# Patient Record
Sex: Female | Born: 1960
Health system: Southern US, Community
[De-identification: ages and names within clinical notes are randomized; demographics above are authoritative.]

## PROBLEM LIST (undated history)

## (undated) DIAGNOSIS — G2581 Restless legs syndrome: Secondary | ICD-10-CM

## (undated) DIAGNOSIS — K829 Disease of gallbladder, unspecified: Secondary | ICD-10-CM

## (undated) DIAGNOSIS — Z8709 Personal history of other diseases of the respiratory system: Secondary | ICD-10-CM

## (undated) DIAGNOSIS — F329 Major depressive disorder, single episode, unspecified: Secondary | ICD-10-CM

## (undated) DIAGNOSIS — Z9889 Other specified postprocedural states: Secondary | ICD-10-CM

## (undated) DIAGNOSIS — K5900 Constipation, unspecified: Secondary | ICD-10-CM

## (undated) DIAGNOSIS — T7840XA Allergy, unspecified, initial encounter: Secondary | ICD-10-CM

## (undated) DIAGNOSIS — K760 Fatty (change of) liver, not elsewhere classified: Secondary | ICD-10-CM

## (undated) DIAGNOSIS — R112 Nausea with vomiting, unspecified: Secondary | ICD-10-CM

## (undated) DIAGNOSIS — O139 Gestational [pregnancy-induced] hypertension without significant proteinuria, unspecified trimester: Secondary | ICD-10-CM

## (undated) DIAGNOSIS — T4145XA Adverse effect of unspecified anesthetic, initial encounter: Secondary | ICD-10-CM

## (undated) DIAGNOSIS — F32A Depression, unspecified: Secondary | ICD-10-CM

## (undated) DIAGNOSIS — R600 Localized edema: Secondary | ICD-10-CM

## (undated) DIAGNOSIS — M1990 Unspecified osteoarthritis, unspecified site: Secondary | ICD-10-CM

## (undated) DIAGNOSIS — T8859XA Other complications of anesthesia, initial encounter: Secondary | ICD-10-CM

## (undated) DIAGNOSIS — M549 Dorsalgia, unspecified: Secondary | ICD-10-CM

## (undated) DIAGNOSIS — K219 Gastro-esophageal reflux disease without esophagitis: Secondary | ICD-10-CM

## (undated) DIAGNOSIS — G4733 Obstructive sleep apnea (adult) (pediatric): Secondary | ICD-10-CM

## (undated) DIAGNOSIS — E669 Obesity, unspecified: Secondary | ICD-10-CM

## (undated) DIAGNOSIS — J039 Acute tonsillitis, unspecified: Secondary | ICD-10-CM

## (undated) DIAGNOSIS — G4730 Sleep apnea, unspecified: Secondary | ICD-10-CM

## (undated) HISTORY — DX: Acute tonsillitis, unspecified: J03.90

## (undated) HISTORY — PX: HEMORRHOID SURGERY: SHX153

## (undated) HISTORY — DX: Dorsalgia, unspecified: M54.9

## (undated) HISTORY — DX: Fatty (change of) liver, not elsewhere classified: K76.0

## (undated) HISTORY — DX: Allergy, unspecified, initial encounter: T78.40XA

## (undated) HISTORY — DX: Major depressive disorder, single episode, unspecified: F32.9

## (undated) HISTORY — PX: UPPER GASTROINTESTINAL ENDOSCOPY: SHX188

## (undated) HISTORY — DX: Depression, unspecified: F32.A

## (undated) HISTORY — PX: OTHER SURGICAL HISTORY: SHX169

## (undated) HISTORY — DX: Restless legs syndrome: G25.81

## (undated) HISTORY — DX: Personal history of other diseases of the respiratory system: Z87.09

## (undated) HISTORY — DX: Localized edema: R60.0

## (undated) HISTORY — DX: Disease of gallbladder, unspecified: K82.9

## (undated) HISTORY — DX: Obstructive sleep apnea (adult) (pediatric): G47.33

## (undated) HISTORY — PX: CERVICAL BIOPSY: SHX590

## (undated) HISTORY — DX: Constipation, unspecified: K59.00

## (undated) HISTORY — DX: Sleep apnea, unspecified: G47.30

## (undated) HISTORY — PX: TUBAL LIGATION: SHX77

## (undated) HISTORY — DX: Obesity, unspecified: E66.9

## (undated) HISTORY — DX: Gestational (pregnancy-induced) hypertension without significant proteinuria, unspecified trimester: O13.9

---

## 1990-02-04 HISTORY — PX: LAPAROSCOPIC CHOLECYSTECTOMY: SUR755

## 1997-12-16 ENCOUNTER — Ambulatory Visit (HOSPITAL_COMMUNITY): Admission: RE | Admit: 1997-12-16 | Discharge: 1997-12-16 | Payer: Self-pay | Admitting: *Deleted

## 1998-02-17 ENCOUNTER — Other Ambulatory Visit: Admission: RE | Admit: 1998-02-17 | Discharge: 1998-02-17 | Payer: Self-pay | Admitting: Gynecology

## 1998-05-09 ENCOUNTER — Other Ambulatory Visit: Admission: RE | Admit: 1998-05-09 | Discharge: 1998-05-09 | Payer: Self-pay | Admitting: Gynecology

## 1998-10-31 ENCOUNTER — Other Ambulatory Visit: Admission: RE | Admit: 1998-10-31 | Discharge: 1998-10-31 | Payer: Self-pay | Admitting: Gynecology

## 1999-01-25 ENCOUNTER — Other Ambulatory Visit: Admission: RE | Admit: 1999-01-25 | Discharge: 1999-01-25 | Payer: Self-pay | Admitting: Gynecology

## 1999-03-22 ENCOUNTER — Ambulatory Visit (HOSPITAL_COMMUNITY): Admission: RE | Admit: 1999-03-22 | Discharge: 1999-03-22 | Payer: Self-pay | Admitting: Gynecology

## 1999-03-22 ENCOUNTER — Encounter (INDEPENDENT_AMBULATORY_CARE_PROVIDER_SITE_OTHER): Payer: Self-pay | Admitting: Specialist

## 1999-05-24 ENCOUNTER — Other Ambulatory Visit: Admission: RE | Admit: 1999-05-24 | Discharge: 1999-05-24 | Payer: Self-pay | Admitting: Gynecology

## 1999-08-29 ENCOUNTER — Other Ambulatory Visit: Admission: RE | Admit: 1999-08-29 | Discharge: 1999-08-29 | Payer: Self-pay | Admitting: Gynecology

## 2000-03-05 ENCOUNTER — Other Ambulatory Visit: Admission: RE | Admit: 2000-03-05 | Discharge: 2000-03-05 | Payer: Self-pay | Admitting: Gynecology

## 2000-08-26 ENCOUNTER — Other Ambulatory Visit: Admission: RE | Admit: 2000-08-26 | Discharge: 2000-08-26 | Payer: Self-pay | Admitting: Gynecology

## 2001-02-25 ENCOUNTER — Other Ambulatory Visit: Admission: RE | Admit: 2001-02-25 | Discharge: 2001-02-25 | Payer: Self-pay | Admitting: Gynecology

## 2002-10-27 ENCOUNTER — Other Ambulatory Visit: Admission: RE | Admit: 2002-10-27 | Discharge: 2002-10-27 | Payer: Self-pay | Admitting: Gynecology

## 2003-11-09 ENCOUNTER — Other Ambulatory Visit: Admission: RE | Admit: 2003-11-09 | Discharge: 2003-11-09 | Payer: Self-pay | Admitting: Gynecology

## 2004-02-29 ENCOUNTER — Ambulatory Visit: Payer: Self-pay | Admitting: Endocrinology

## 2004-03-02 ENCOUNTER — Ambulatory Visit: Payer: Self-pay | Admitting: Endocrinology

## 2004-11-27 ENCOUNTER — Ambulatory Visit: Payer: Self-pay | Admitting: Internal Medicine

## 2004-11-29 ENCOUNTER — Other Ambulatory Visit: Admission: RE | Admit: 2004-11-29 | Discharge: 2004-11-29 | Payer: Self-pay | Admitting: Gynecology

## 2004-12-04 ENCOUNTER — Ambulatory Visit: Payer: Self-pay | Admitting: Internal Medicine

## 2005-01-03 ENCOUNTER — Other Ambulatory Visit: Admission: RE | Admit: 2005-01-03 | Discharge: 2005-01-03 | Payer: Self-pay | Admitting: Gynecology

## 2005-01-23 ENCOUNTER — Ambulatory Visit (HOSPITAL_BASED_OUTPATIENT_CLINIC_OR_DEPARTMENT_OTHER): Admission: RE | Admit: 2005-01-23 | Discharge: 2005-01-23 | Payer: Self-pay | Admitting: Internal Medicine

## 2005-02-01 ENCOUNTER — Ambulatory Visit: Payer: Self-pay | Admitting: Pulmonary Disease

## 2005-03-05 ENCOUNTER — Ambulatory Visit: Payer: Self-pay | Admitting: Pulmonary Disease

## 2005-04-02 ENCOUNTER — Ambulatory Visit: Payer: Self-pay | Admitting: Pulmonary Disease

## 2005-04-15 ENCOUNTER — Ambulatory Visit: Payer: Self-pay | Admitting: Internal Medicine

## 2005-04-16 ENCOUNTER — Other Ambulatory Visit: Admission: RE | Admit: 2005-04-16 | Discharge: 2005-04-16 | Payer: Self-pay | Admitting: Gynecology

## 2005-05-09 ENCOUNTER — Ambulatory Visit: Payer: Self-pay | Admitting: Internal Medicine

## 2005-08-01 ENCOUNTER — Ambulatory Visit: Payer: Self-pay | Admitting: Pulmonary Disease

## 2005-08-01 ENCOUNTER — Ambulatory Visit: Payer: Self-pay | Admitting: Internal Medicine

## 2005-08-08 ENCOUNTER — Ambulatory Visit: Payer: Self-pay | Admitting: Pulmonary Disease

## 2005-08-08 ENCOUNTER — Ambulatory Visit (HOSPITAL_BASED_OUTPATIENT_CLINIC_OR_DEPARTMENT_OTHER): Admission: RE | Admit: 2005-08-08 | Discharge: 2005-08-08 | Payer: Self-pay | Admitting: Pulmonary Disease

## 2005-08-30 ENCOUNTER — Ambulatory Visit: Payer: Self-pay | Admitting: Pulmonary Disease

## 2005-10-17 ENCOUNTER — Ambulatory Visit: Payer: Self-pay | Admitting: Internal Medicine

## 2005-10-25 ENCOUNTER — Ambulatory Visit: Payer: Self-pay | Admitting: Pulmonary Disease

## 2005-11-21 ENCOUNTER — Ambulatory Visit: Payer: Self-pay | Admitting: Gastroenterology

## 2005-12-03 ENCOUNTER — Ambulatory Visit: Payer: Self-pay | Admitting: Gastroenterology

## 2005-12-16 ENCOUNTER — Ambulatory Visit: Payer: Self-pay | Admitting: Gastroenterology

## 2005-12-16 LAB — CONVERTED CEMR LAB
Fecal Occult Blood: NEGATIVE
OCCULT 5: NEGATIVE

## 2005-12-19 ENCOUNTER — Other Ambulatory Visit: Admission: RE | Admit: 2005-12-19 | Discharge: 2005-12-19 | Payer: Self-pay | Admitting: Gynecology

## 2005-12-31 ENCOUNTER — Ambulatory Visit: Payer: Self-pay | Admitting: Pulmonary Disease

## 2006-01-01 ENCOUNTER — Ambulatory Visit: Payer: Self-pay | Admitting: Internal Medicine

## 2006-01-01 LAB — CONVERTED CEMR LAB
ALT: 25 units/L (ref 0–40)
AST: 53 units/L — ABNORMAL HIGH (ref 0–37)
BUN: 14 mg/dL (ref 6–23)
Basophils Relative: 0.5 % (ref 0.0–1.0)
Bilirubin Urine: NEGATIVE
Chloride: 105 meq/L (ref 96–112)
Chol/HDL Ratio, serum: 4
Cholesterol: 194 mg/dL (ref 0–200)
Eosinophil percent: 1.3 % (ref 0.0–5.0)
GFR calc non Af Amer: 83 mL/min
HCT: 39.1 % (ref 36.0–46.0)
HDL: 48.2 mg/dL (ref >39.0)
Hemoglobin: 13.6 g/dL (ref 12.0–15.0)
Leukocytes, UA: NEGATIVE
MCV: 84.5 fL (ref 78.0–100.0)
RBC: 4.62 M/uL (ref 3.87–5.11)
RDW: 12.8 % (ref 11.5–14.6)
Sodium: 137 meq/L (ref 135–145)
Specific Gravity, Urine: >=1.03 (ref 1.000–1.03)
Total Bilirubin: 0.5 mg/dL (ref 0.3–1.2)
Total Protein, Urine: NEGATIVE mg/dL
Triglyceride fasting, serum: 100 mg/dL (ref 0–149)
WBC: 7.6 10*3/uL (ref 4.5–10.5)

## 2006-01-17 ENCOUNTER — Ambulatory Visit: Payer: Self-pay | Admitting: Internal Medicine

## 2006-04-16 ENCOUNTER — Ambulatory Visit: Payer: Self-pay | Admitting: Pulmonary Disease

## 2006-06-10 ENCOUNTER — Ambulatory Visit: Payer: Self-pay | Admitting: Internal Medicine

## 2006-06-13 ENCOUNTER — Ambulatory Visit: Payer: Self-pay | Admitting: Gastroenterology

## 2006-08-13 ENCOUNTER — Other Ambulatory Visit: Admission: RE | Admit: 2006-08-13 | Discharge: 2006-08-13 | Payer: Self-pay | Admitting: Gynecology

## 2006-09-01 ENCOUNTER — Ambulatory Visit: Payer: Self-pay | Admitting: Internal Medicine

## 2006-10-14 ENCOUNTER — Ambulatory Visit: Payer: Self-pay | Admitting: Internal Medicine

## 2006-10-14 LAB — CONVERTED CEMR LAB
Free T4: 0.7 ng/dL (ref 0.6–1.6)
TSH: 1.55 microintl units/mL (ref 0.35–5.50)

## 2006-11-27 ENCOUNTER — Ambulatory Visit (HOSPITAL_COMMUNITY): Admission: RE | Admit: 2006-11-27 | Discharge: 2006-11-27 | Payer: Self-pay | Admitting: Orthopaedic Surgery

## 2006-12-02 DIAGNOSIS — G4733 Obstructive sleep apnea (adult) (pediatric): Secondary | ICD-10-CM | POA: Insufficient documentation

## 2006-12-02 DIAGNOSIS — G2581 Restless legs syndrome: Secondary | ICD-10-CM | POA: Insufficient documentation

## 2006-12-09 ENCOUNTER — Telehealth: Payer: Self-pay | Admitting: Internal Medicine

## 2006-12-31 ENCOUNTER — Other Ambulatory Visit: Admission: RE | Admit: 2006-12-31 | Discharge: 2006-12-31 | Payer: Self-pay | Admitting: Gynecology

## 2007-01-15 ENCOUNTER — Ambulatory Visit: Payer: Self-pay | Admitting: Internal Medicine

## 2007-01-15 LAB — CONVERTED CEMR LAB
AST: 54 units/L — ABNORMAL HIGH (ref 0–37)
Bilirubin, Direct: 0.1 mg/dL (ref 0.0–0.3)
Chloride: 104 meq/L (ref 96–112)
Cholesterol: 188 mg/dL (ref 0–200)
Eosinophils Absolute: 0.1 10*3/uL (ref 0.0–0.6)
Eosinophils Relative: 1.6 % (ref 0.0–5.0)
GFR calc non Af Amer: 82 mL/min
Glucose, Bld: 111 mg/dL — ABNORMAL HIGH (ref 70–99)
HCT: 37.3 % (ref 36.0–46.0)
Leukocytes, UA: NEGATIVE
Lymphocytes Relative: 30.8 % (ref 12.0–46.0)
MCV: 81.8 fL (ref 78.0–100.0)
Neutro Abs: 4.6 10*3/uL (ref 1.4–7.7)
Neutrophils Relative %: 60 % (ref 43.0–77.0)
Nitrite: NEGATIVE
RBC: 4.56 M/uL (ref 3.87–5.11)
Sodium: 138 meq/L (ref 135–145)
TSH: 1.55 microintl units/mL (ref 0.35–5.50)
Total Bilirubin: 0.7 mg/dL (ref 0.3–1.2)
Total Protein: 7.3 g/dL (ref 6.0–8.3)
Urobilinogen, UA: 0.2 (ref 0.0–1.0)
WBC: 7.6 10*3/uL (ref 4.5–10.5)

## 2007-01-16 ENCOUNTER — Encounter: Payer: Self-pay | Admitting: Internal Medicine

## 2007-01-20 ENCOUNTER — Ambulatory Visit: Payer: Self-pay | Admitting: Internal Medicine

## 2007-01-20 DIAGNOSIS — J3089 Other allergic rhinitis: Secondary | ICD-10-CM

## 2007-01-20 DIAGNOSIS — J302 Other seasonal allergic rhinitis: Secondary | ICD-10-CM | POA: Insufficient documentation

## 2007-01-20 DIAGNOSIS — M25569 Pain in unspecified knee: Secondary | ICD-10-CM | POA: Insufficient documentation

## 2007-01-20 DIAGNOSIS — K5730 Diverticulosis of large intestine without perforation or abscess without bleeding: Secondary | ICD-10-CM | POA: Insufficient documentation

## 2007-01-22 ENCOUNTER — Encounter: Payer: Self-pay | Admitting: Internal Medicine

## 2007-08-04 ENCOUNTER — Ambulatory Visit: Payer: Self-pay | Admitting: Internal Medicine

## 2007-08-04 DIAGNOSIS — M549 Dorsalgia, unspecified: Secondary | ICD-10-CM | POA: Insufficient documentation

## 2007-09-16 ENCOUNTER — Telehealth: Payer: Self-pay | Admitting: Internal Medicine

## 2007-09-17 ENCOUNTER — Encounter: Payer: Self-pay | Admitting: Internal Medicine

## 2007-09-17 DIAGNOSIS — H9209 Otalgia, unspecified ear: Secondary | ICD-10-CM | POA: Insufficient documentation

## 2007-09-30 ENCOUNTER — Encounter: Payer: Self-pay | Admitting: Internal Medicine

## 2008-01-07 ENCOUNTER — Encounter: Payer: Self-pay | Admitting: Internal Medicine

## 2008-01-07 LAB — CONVERTED CEMR LAB

## 2008-01-08 ENCOUNTER — Ambulatory Visit: Payer: Self-pay | Admitting: Internal Medicine

## 2008-01-08 LAB — CONVERTED CEMR LAB
AST: 49 units/L — ABNORMAL HIGH (ref 0–37)
Basophils Absolute: 0 10*3/uL (ref 0.0–0.1)
Basophils Relative: 0.3 % (ref 0.0–3.0)
Bilirubin Urine: NEGATIVE
Bilirubin, Direct: 0.1 mg/dL (ref 0.0–0.3)
CO2: 24 meq/L (ref 19–32)
Calcium: 9.2 mg/dL (ref 8.4–10.5)
Chloride: 108 meq/L (ref 96–112)
Cholesterol: 185 mg/dL (ref 0–200)
Creatinine, Ser: 0.8 mg/dL (ref 0.4–1.2)
Eosinophils Absolute: 0.1 10*3/uL (ref 0.0–0.7)
GFR calc non Af Amer: 82 mL/min
HDL: 48.3 mg/dL (ref >39.0)
Hemoglobin, Urine: NEGATIVE
Ketones, ur: NEGATIVE mg/dL
LDL Cholesterol: 109 mg/dL — ABNORMAL HIGH (ref 0–99)
Leukocytes, UA: NEGATIVE
Lymphocytes Relative: 31.6 % (ref 12.0–46.0)
MCHC: 34.4 g/dL (ref 30.0–36.0)
MCV: 81.3 fL (ref 78.0–100.0)
Neutrophils Relative %: 59.7 % (ref 43.0–77.0)
RBC: 4.53 M/uL (ref 3.87–5.11)
RDW: 13.8 % (ref 11.5–14.6)
Sodium: 139 meq/L (ref 135–145)
TSH: 1.44 microintl units/mL (ref 0.35–5.50)
Total Bilirubin: 0.6 mg/dL (ref 0.3–1.2)
Total CHOL/HDL Ratio: 3.8
Triglycerides: 139 mg/dL (ref 0–149)
Urine Glucose: NEGATIVE mg/dL
Urobilinogen, UA: 0.2 (ref 0.0–1.0)
VLDL: 28 mg/dL (ref 0–40)

## 2008-01-14 ENCOUNTER — Ambulatory Visit: Payer: Self-pay | Admitting: Internal Medicine

## 2008-05-19 ENCOUNTER — Encounter: Payer: Self-pay | Admitting: Internal Medicine

## 2008-10-21 ENCOUNTER — Telehealth: Payer: Self-pay | Admitting: Internal Medicine

## 2008-11-15 ENCOUNTER — Encounter: Payer: Self-pay | Admitting: Internal Medicine

## 2008-11-16 ENCOUNTER — Encounter: Payer: Self-pay | Admitting: Internal Medicine

## 2009-01-02 ENCOUNTER — Telehealth (INDEPENDENT_AMBULATORY_CARE_PROVIDER_SITE_OTHER): Payer: Self-pay | Admitting: *Deleted

## 2009-01-10 ENCOUNTER — Ambulatory Visit: Payer: Self-pay | Admitting: Internal Medicine

## 2009-01-10 LAB — CONVERTED CEMR LAB
Albumin: 4.1 g/dL (ref 3.5–5.2)
Alkaline Phosphatase: 95 units/L (ref 39–117)
BUN: 12 mg/dL (ref 6–23)
Basophils Absolute: 0 10*3/uL (ref 0.0–0.1)
Bilirubin, Direct: 0 mg/dL (ref 0.0–0.3)
CO2: 26 meq/L (ref 19–32)
Calcium: 9.3 mg/dL (ref 8.4–10.5)
Creatinine, Ser: 0.9 mg/dL (ref 0.4–1.2)
Eosinophils Absolute: 0.1 10*3/uL (ref 0.0–0.7)
GFR calc non Af Amer: 71.1 mL/min (ref >60)
Glucose, Bld: 106 mg/dL — ABNORMAL HIGH (ref 70–99)
HDL: 59.3 mg/dL (ref >39.00)
Hgb A1c MFr Bld: 5.9 % (ref 4.6–6.5)
Iron: 55 ug/dL (ref 42–145)
Lymphocytes Relative: 28.8 % (ref 12.0–46.0)
MCHC: 33.3 g/dL (ref 30.0–36.0)
Monocytes Relative: 6.6 % (ref 3.0–12.0)
Neutro Abs: 4.4 10*3/uL (ref 1.4–7.7)
Neutrophils Relative %: 63.1 % (ref 43.0–77.0)
Platelets: 265 10*3/uL (ref 150.0–400.0)
RDW: 15.5 % — ABNORMAL HIGH (ref 11.5–14.6)
Saturation Ratios: 12.2 % — ABNORMAL LOW (ref 20.0–50.0)
Specific Gravity, Urine: >=1.03 (ref 1.000–1.030)
Total Bilirubin: 0.6 mg/dL (ref 0.3–1.2)
Transferrin: 321.5 mg/dL (ref 212.0–360.0)
Triglycerides: 75 mg/dL (ref 0.0–149.0)
Urine Glucose: NEGATIVE mg/dL
Urobilinogen, UA: 0.2 (ref 0.0–1.0)
VLDL: 15 mg/dL (ref 0.0–40.0)
pH: 5 (ref 5.0–8.0)

## 2009-01-11 ENCOUNTER — Encounter: Payer: Self-pay | Admitting: Internal Medicine

## 2009-01-17 ENCOUNTER — Ambulatory Visit: Payer: Self-pay | Admitting: Internal Medicine

## 2009-04-05 ENCOUNTER — Ambulatory Visit: Payer: Self-pay | Admitting: Internal Medicine

## 2009-04-05 DIAGNOSIS — J029 Acute pharyngitis, unspecified: Secondary | ICD-10-CM | POA: Insufficient documentation

## 2009-04-05 LAB — CONVERTED CEMR LAB
Basophils Absolute: 0.1 10*3/uL (ref 0.0–0.1)
Eosinophils Absolute: 0 10*3/uL (ref 0.0–0.7)
HCT: 33.8 % — ABNORMAL LOW (ref 36.0–46.0)
Hemoglobin: 11.3 g/dL — ABNORMAL LOW (ref 12.0–15.0)
Lymphocytes Relative: 5.3 % — ABNORMAL LOW (ref 12.0–46.0)
Lymphs Abs: 0.9 10*3/uL (ref 0.7–4.0)
MCHC: 33.6 g/dL (ref 30.0–36.0)
Neutro Abs: 15 10*3/uL — ABNORMAL HIGH (ref 1.4–7.7)
RDW: 14.4 % (ref 11.5–14.6)

## 2009-04-07 ENCOUNTER — Telehealth: Payer: Self-pay | Admitting: Internal Medicine

## 2009-04-07 ENCOUNTER — Ambulatory Visit: Payer: Self-pay | Admitting: Internal Medicine

## 2009-04-07 DIAGNOSIS — J36 Peritonsillar abscess: Secondary | ICD-10-CM | POA: Insufficient documentation

## 2009-04-14 ENCOUNTER — Telehealth: Payer: Self-pay | Admitting: Internal Medicine

## 2009-04-18 ENCOUNTER — Encounter: Payer: Self-pay | Admitting: Internal Medicine

## 2009-04-26 ENCOUNTER — Ambulatory Visit: Payer: Self-pay | Admitting: Internal Medicine

## 2009-05-02 ENCOUNTER — Encounter: Payer: Self-pay | Admitting: Internal Medicine

## 2009-05-02 ENCOUNTER — Telehealth: Payer: Self-pay | Admitting: Internal Medicine

## 2009-05-04 ENCOUNTER — Ambulatory Visit: Payer: Self-pay | Admitting: Internal Medicine

## 2009-06-21 ENCOUNTER — Telehealth: Payer: Self-pay | Admitting: Gastroenterology

## 2009-07-08 ENCOUNTER — Telehealth: Payer: Self-pay | Admitting: Internal Medicine

## 2009-09-29 ENCOUNTER — Encounter: Payer: Self-pay | Admitting: Internal Medicine

## 2009-10-04 ENCOUNTER — Encounter: Payer: Self-pay | Admitting: Internal Medicine

## 2009-10-05 ENCOUNTER — Telehealth: Payer: Self-pay | Admitting: Internal Medicine

## 2009-11-03 ENCOUNTER — Ambulatory Visit: Payer: Self-pay | Admitting: Internal Medicine

## 2009-11-29 ENCOUNTER — Telehealth: Payer: Self-pay | Admitting: Internal Medicine

## 2009-12-05 ENCOUNTER — Ambulatory Visit: Payer: Self-pay | Admitting: Cardiovascular Disease

## 2009-12-13 ENCOUNTER — Telehealth: Payer: Self-pay | Admitting: Internal Medicine

## 2009-12-14 ENCOUNTER — Encounter: Payer: Self-pay | Admitting: Internal Medicine

## 2009-12-15 ENCOUNTER — Telehealth: Payer: Self-pay | Admitting: Internal Medicine

## 2010-01-09 ENCOUNTER — Ambulatory Visit: Payer: Self-pay | Admitting: Internal Medicine

## 2010-01-09 LAB — CONVERTED CEMR LAB
ALT: 23 units/L (ref 0–35)
Alkaline Phosphatase: 73 units/L (ref 39–117)
BUN: 14 mg/dL (ref 6–23)
Basophils Relative: 0.4 % (ref 0.0–3.0)
Bilirubin Urine: NEGATIVE
Bilirubin, Direct: 0 mg/dL (ref 0.0–0.3)
Calcium: 8.9 mg/dL (ref 8.4–10.5)
Chloride: 103 meq/L (ref 96–112)
Creatinine, Ser: 0.7 mg/dL (ref 0.4–1.2)
Direct LDL: 105.1 mg/dL
Eosinophils Relative: 1.8 % (ref 0.0–5.0)
GFR calc non Af Amer: 90.15 mL/min (ref >60.00)
Lymphocytes Relative: 25.8 % (ref 12.0–46.0)
MCV: 77.3 fL — ABNORMAL LOW (ref 78.0–100.0)
Monocytes Absolute: 0.6 10*3/uL (ref 0.1–1.0)
Neutrophils Relative %: 63.9 % (ref 43.0–77.0)
Nitrite: NEGATIVE
Platelets: 241 10*3/uL (ref 150.0–400.0)
RBC: 4.19 M/uL (ref 3.87–5.11)
Total Bilirubin: 0.5 mg/dL (ref 0.3–1.2)
Total Protein, Urine: NEGATIVE mg/dL
Total Protein: 7 g/dL (ref 6.0–8.3)
Urine Glucose: NEGATIVE mg/dL
WBC: 6.8 10*3/uL (ref 4.5–10.5)
pH: 5 (ref 5.0–8.0)

## 2010-01-15 ENCOUNTER — Encounter: Payer: Self-pay | Admitting: Internal Medicine

## 2010-01-18 ENCOUNTER — Encounter: Payer: Self-pay | Admitting: Internal Medicine

## 2010-01-18 ENCOUNTER — Ambulatory Visit: Payer: Self-pay | Admitting: Internal Medicine

## 2010-01-18 DIAGNOSIS — D509 Iron deficiency anemia, unspecified: Secondary | ICD-10-CM | POA: Insufficient documentation

## 2010-01-18 LAB — CONVERTED CEMR LAB
Iron: 29 ug/dL — ABNORMAL LOW (ref 42–145)
Retic Ct Pct: 1 % (ref 0.4–3.1)
Saturation Ratios: 5.6 % — ABNORMAL LOW (ref 20.0–50.0)
Transferrin: 370.9 mg/dL — ABNORMAL HIGH (ref 212.0–360.0)

## 2010-01-22 ENCOUNTER — Telehealth: Payer: Self-pay | Admitting: Internal Medicine

## 2010-03-06 NOTE — Consult Note (Signed)
Summary: [facility]  [facility]   Imported By: Lester [place] 10/10/2009 10:12:25  _____________________________________________________________________  External Attachment:    Type:   Image     Comment:   External Document

## 2010-03-06 NOTE — Assessment & Plan Note (Signed)
Summary: THROAT RE-CHECK/NWS   Vital Signs:  Patient profile:   50 year old female Height:      66 inches Weight:      250 pounds BMI:     40.50 O2 Sat:      97 % on Room air Temp:     98.1 degrees F oral Pulse rate:   90 / minute BP sitting:   120 / 70  (left arm) Cuff size:   large  Vitals Entered By: Bill Salinas CMA (November 03, 2009 1:07 PM)  O2 Flow:  Room air CC: Pt here for evaluation of sore throat since march/ ab   Primary Care [place] [facility]:  [facility]  CC:  Pt here for evaluation of sore throat since march/ ab.  History of Present Illness: [place] has had an intermittent sore throat since March. She has been treated with multiple rounds of antibiotics but the discomfort will continue to recur. Her pain is predominantly on the left side and seems to be deep in the throat. She has had no fevers, dysphagia, odynophagia. She has been screened for possible chronic disease including STDs. She has seen Dr. Narda Bonds and Dr Kelli Churn with normal exams but no fiberoptic nasopharyngoscopic exam. She has not had weight loss, night sweats or lymphadenopathy. She is concerned for a more troublesome or serious diagnosis.  Current Medications (verified): 1)  Pantoprazole Sodium 40 Mg Tbec (Pantoprazole Sodium) .Marland Kitchen.. 1 By Mouth Q Am 2)  Citracal Plus   Tabs (Multiple Minerals-Vitamins) .... Take 2 By Mouth Once Daily 3)  Fibercon 625 Mg  Tabs (Calcium Polycarbophil) .... Take 2 By Mouth Once Daily 4)  Ropinirole Hcl 0.25 Mg  Tabs (Ropinirole Hcl) .... 3 Tabs At Bedtime 5)  Zyrtec-D Allergy & Congestion 5-120 Mg Xr12h-Tab (Cetirizine-Pseudoephedrine) .... Take 1 Tablet By Mouth Every 12 Hours 6)  Vitamin D3 1000 Unit  Tabs (Cholecalciferol) .... 2 By Mouth Once Daily 7)  Robitussin Cough Long-Acting 15 Mg/77ml  Syrp (Dextromethorphan Hbr) .... Take As Needed As Directed 8)  Mucinex Dm 30-600 Mg  Tb12 (Dextromethorphan-Guaifenesin) .... Take As Needed As Directed 9)  Glucosamine-Chondroitin  1500-1200 Mg/18ml  Liqd (Glucosamine-Chondroitin) .... Double Strength Take 1 Tablet By Mouth Three Times A Day 10)  Flax Seed Oil 1000 Mg  Caps (Flaxseed (Linseed)) .... Take 3 Tablet By Mouth Two Times A Day 11)  Aleve 220 Mg Tabs (Naproxen Sodium) .... Take 1 Tab By Mouth At Bedtime As Needed 12)  Advil 200 Mg Tabs (Ibuprofen) .... Take 1 Tab By Mouth At Bedtime As Needed 13)  Triple Flex 2 14)  Allegra-D Allergy & Congestion 60-120 Mg Xr12h-Tab (Fexofenadine-Pseudoephedrine) .Marland Kitchen.. 1 Tab Daily  Allergies (verified): 1)  ! Cleocin  Past History:  Past Medical History: Last updated: 01/20/2007  UCD ROUTINE GENERAL MEDICAL EXAM@HEALTH  CARE FACL (ICD-V70.0) RESTLESS LEG SYNDROME (ICD-333.94) OBSTRUCTIVE SLEEP APNEA (ICD-327.23) Tonsillitis esophagitis Allergic rhinitis hx of sinusitis Obesity  Past Surgical History: Last updated: 01/20/2007 cervical biopsy  Lap cholecystectomy '92 Bilateral salpingectomy C-section x 23  Family History: Last updated: 01/20/2007 father- 1939; HTN, glaucoma, obesity mother - 42; Asthma, COPD Neg- breast, colon cancer; DM, CAD  Social History: Last updated: 01/20/2007 HSG married 1981-divorce '89 2 daughters, '86, 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  Review of Systems       The patient complains of weight loss.  The patient denies anorexia, fever, weight gain, decreased hearing, chest pain, syncope, dyspnea on exertion, prolonged cough, hemoptysis,  abdominal pain, severe indigestion/heartburn, muscle weakness, suspicious skin lesions, unusual weight change, abnormal bleeding, enlarged lymph nodes, and angioedema.    Physical Exam  General:  overweight (but less) white female in no distress Head:  normocephalic, atraumatic, and no abnormalities observed.   Mouth:  posterior pharynx clear Neck:  supple, no masses, no thyromegaly, and no thyroid nodules or tenderness.   Lungs:  normal respiratory  effort.   Heart:  normal rate and regular rhythm.   Pulses:  2+ radial Skin:  turgor normal and color normal.   Cervical Nodes:  no anterior cervical adenopathy and no posterior cervical adenopathy.   Psych:  normally interactive, good eye contact, and moderately anxious.     Impression & Recommendations:  Problem # 1:  EXUDATIVE PHARYNGITIS (ICD-462) persistent symptoms but no signs of infection today. Negative ENT exams as well.  Plan - for possible persistent fungal infection - fluconazole 100mg  once daily x 14 days           if persistent or recurrent symptoms - MRI neck to rule out any underlying deep seated infection or malignancy.  The following medications were removed from the medication list:    Penicillin V Potassium 500 Mg Tabs (Penicillin v potassium) .Marland Kitchen... 1 by mouth qid x 7 for pharyngitis Her updated medication list for this problem includes:    Aleve 220 Mg Tabs (Naproxen sodium) .Marland Kitchen... Take 1 tab by mouth at bedtime as needed    Advil 200 Mg Tabs (Ibuprofen) .Marland Kitchen... Take 1 tab by mouth at bedtime as needed  Complete Medication List: 1)  Pantoprazole Sodium 40 Mg Tbec (Pantoprazole sodium) .Marland Kitchen.. 1 by mouth q am 2)  Citracal Plus Tabs (Multiple minerals-vitamins) .... Take 2 by mouth once daily 3)  Fibercon 625 Mg Tabs (Calcium polycarbophil) .... Take 2 by mouth once daily 4)  Ropinirole Hcl 0.25 Mg Tabs (Ropinirole hcl) .... 3 tabs at bedtime 5)  Zyrtec-d Allergy & Congestion 5-120 Mg Xr12h-tab (Cetirizine-pseudoephedrine) .... Take 1 tablet by mouth every 12 hours 6)  Vitamin D3 1000 Unit Tabs (Cholecalciferol) .... 2 by mouth once daily 7)  Robitussin Cough Long-acting 15 Mg/22ml Syrp (Dextromethorphan hbr) .... Take as needed as directed 8)  Mucinex Dm 30-600 Mg Tb12 (Dextromethorphan-guaifenesin) .... Take as needed as directed 9)  Glucosamine-chondroitin 1500-1200 Mg/55ml Liqd (Glucosamine-chondroitin) .... Double strength take 1 tablet by mouth three times a day 10)   Flax Seed Oil 1000 Mg Caps (Flaxseed (linseed)) .... Take 3 tablet by mouth two times a day 11)  Aleve 220 Mg Tabs (Naproxen sodium) .... Take 1 tab by mouth at bedtime as needed 12)  Advil 200 Mg Tabs (Ibuprofen) .... Take 1 tab by mouth at bedtime as needed 13)  Triple Flex 2  14)  Allegra-d Allergy & Congestion 60-120 Mg Xr12h-tab (Fexofenadine-pseudoephedrine) .Marland Kitchen.. 1 tab daily 15)  Fluconazole 100 Mg Tabs (Fluconazole) .Marland Kitchen.. 1 by mouth once daily x 14 days for pharyngitis. 16)  Sertraline Hcl 50 Mg Tabs (Sertraline hcl) .Marland Kitchen.. 1 by mouth once daily for anxiety and depression Prescriptions: SERTRALINE HCL 50 MG TABS (SERTRALINE HCL) 1 by mouth once daily for anxiety and depression  #30 x 6   Entered and Authorized by:   Jacques Navy MD   Signed by:   Jacques Navy MD on 11/03/2009   Method used:   Electronically to        [facility]. #[id number]* (retail)       [address]  [place], [place]  [address]       Ph: [phone]       Fax: [phone]   RxID:   [id number] FLUCONAZOLE 100 MG TABS (FLUCONAZOLE) 1 by mouth once daily x 14 days for pharyngitis.  #14 x 0   Entered and Authorized by:   Jacques Navy MD   Signed by:   Jacques Navy MD on 11/03/2009   Method used:   Electronically to        [facility]. [phone]* (retail)       [address]       [place], [place]  [address]       Ph: [phone]       Fax: [phone]   RxID:   [id number]   Appended Document: THROAT RE-CHECK/NWS This situation of persistent throat pain has caused significant anxiety and some exacerbation of depression.   Plan - sertraline 50mg  once daily

## 2010-03-06 NOTE — Progress Notes (Signed)
Summary: THROAT CULTURE  Phone Note Call from Patient   Summary of Call: Patient is requesting to know if MD would be agreeable to throat culture. She says she has never had this done and would like to before signing up for surgery. If ok, can she be swabbed and sent for culture as nurse visit?  Initial call taken by: Lamar Sprinkles, CMA,  December 15, 2009 9:01 AM  Follow-up for Phone Call        throat culture will not be meaningful in the absence of active infection. The CT did not show active infection, rather scarring from previous infections. We have done throat cultures in the acute setting previously  Follow-up by: Jacques Navy MD,  December 15, 2009 12:53 PM  Additional Follow-up for Phone Call Additional follow up Details #1::        Left mess for pt to call back Additional Follow-up by: Lamar Sprinkles, CMA,  December 15, 2009 1:31 PM    Additional Follow-up for Phone Call Additional follow up Details #2::    Pt informed  Follow-up by: Lamar Sprinkles, CMA,  December 15, 2009 2:37 PM

## 2010-03-06 NOTE — Progress Notes (Signed)
Summary: Align Samples  Phone Note Call from Patient   Summary of Call: pt requested samples of Align.  Pt states Dr. Nicholas Lose prescribed for one month and did not have enough samples to cover a full month.  Samples given to pt.  Initial call taken by: Francee Piccolo CMA Duncan Dull),  Jun 21, 2009 2:22 PM    New/Updated Medications: ALIGN  CAPS (PROBIOTIC PRODUCT) Take 1 capsule by mouth once a day

## 2010-03-06 NOTE — Assessment & Plan Note (Signed)
Summary: trouble swallowing / SD   Vital Signs:  Patient profile:   50 year old female Height:      66 inches (167.64 cm) Weight:      211.38 pounds (96.08 kg) BMI:     34.24 O2 Sat:      97 % on Room air Temp:     98.3 degrees F oral Pulse rate:   84 / minute Pulse rhythm:   regular Resp:     16 per minute BP sitting:   122 / 74  (left arm) Cuff size:   large  Vitals Entered By: Brenton Grills (April 07, 2009 1:39 PM)  O2 Flow:  Room air CC: Pt is still haviing trouble swollowing oth solids and liquids. Pt also states she has numbness on left side of neck/aj   Primary Care Provider:  [facility]  CC:  Pt is still haviing trouble swollowing oth solids and liquids. Pt also states she has numbness on left side of neck/aj.  History of Present Illness: She returns with worsening of  her ST and it has now  become assymetrical on the left side with an earache and LAD. She has developed "hot potato" voice and can't swallow any solids or liquids.  Current Medications (verified): 1)  Pantoprazole Sodium 40 Mg Tbec (Pantoprazole Sodium) .Marland Kitchen.. 1 By Mouth Q Am 2)  Citracal Plus   Tabs (Multiple Minerals-Vitamins) .... Take 2 By Mouth Once Daily 3)  Fibercon 625 Mg  Tabs (Calcium Polycarbophil) .... Take 2 By Mouth Once Daily 4)  Ropinirole Hcl 0.25 Mg  Tabs (Ropinirole Hcl) .... 3 Tabs At Bedtime 5)  Zyrtec-D Allergy & Congestion 5-120 Mg Xr12h-Tab (Cetirizine-Pseudoephedrine) .... Take 1 Tablet By Mouth Every 12 Hours 6)  Vitamin D3 1000 Unit  Tabs (Cholecalciferol) .... 2 By Mouth Once Daily 7)  Robitussin Cough Long-Acting 15 Mg/3ml  Syrp (Dextromethorphan Hbr) .... Take As Needed As Directed 8)  Mucinex Dm 30-600 Mg  Tb12 (Dextromethorphan-Guaifenesin) .... Take As Needed As Directed 9)  Glucosamine-Chondroitin 1500-1200 Mg/44ml  Liqd (Glucosamine-Chondroitin) .... Double Strength Take 1 Tablet By Mouth Three Times A Day 10)  Flax Seed Oil 1000 Mg  Caps (Flaxseed (Linseed)) .... Take 3  Tablet By Mouth Two Times A Day 11)  Aleve 220 Mg Tabs (Naproxen Sodium) .... Take 1 Tab By Mouth At Bedtime As Needed 12)  Advil 200 Mg Tabs (Ibuprofen) .... Take 1 Tab By Mouth At Bedtime As Needed 13)  Triple Flex 2 14)  Zithromax Tri-Pak 500 Mg Tab (Azithromycin) .... Take As Directed One By Mouth Once Daily For 3 Days  Allergies (verified): No Known Drug Allergies  Past History:  Past Medical History: Reviewed history from 01/20/2007 and no changes required.  UCD ROUTINE GENERAL MEDICAL EXAM@HEALTH  CARE FACL (ICD-V70.0) RESTLESS LEG SYNDROME (ICD-333.94) OBSTRUCTIVE SLEEP APNEA (ICD-327.23) Tonsillitis esophagitis Allergic rhinitis hx of sinusitis Obesity  Past Surgical History: Reviewed history from 01/20/2007 and no changes required. cervical biopsy  Lap cholecystectomy '92 Bilateral salpingectomy C-section x 23  Family History: Reviewed history from 01/20/2007 and no changes required. father- 67; HTN, glaucoma, obesity mother - 72; Asthma, COPD Neg- breast, colon cancer; DM, CAD  Social History: Reviewed history from 01/20/2007 and no changes required. 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  Review of Systems       The patient complains of enlarged lymph nodes.  The patient denies anorexia, fever, weight loss, chest  pain, abdominal pain, and suspicious skin lesions.    Physical Exam  General:  She has a muffled voice that is classic for the hot potato sound and has mild trismus but no stridor. Head:  normocephalic, atraumatic, no abnormalities observed, and no abnormalities palpated.   Eyes:  vision grossly intact and pupils equal.   Ears:  R ear normal and L ear normal.   Mouth:  pharyngeal crowding  with encroachment from the left side and low-lying uvula with a bulge c/w peritonsillar abscess.  the airway is patent as the right side is not swollen and has no  exudate. Neck:  supple, full ROM, and no masses.   Lungs:  normal respiratory effort, no intercostal retractions, no accessory muscle use, normal breath sounds, no dullness, no fremitus, no crackles, and no wheezes.   Heart:  normal rate, regular rhythm, no murmur, no gallop, and no rub.   Abdomen:  soft, non-tender, normal bowel sounds, no distention, no masses, no guarding, no hepatomegaly, and no splenomegaly.   Msk:  normal ROM, no joint tenderness, no joint swelling, no joint warmth, and no redness over joints.   Extremities:  No clubbing, cyanosis, edema, or deformity noted with normal full range of motion of all joints.   Skin:  turgor normal, color normal, no rashes, no suspicious lesions, and no ecchymoses.   Cervical Nodes:  R anterior LN tender and L anterior LN tender.   Axillary Nodes:  no R axillary adenopathy and no L axillary adenopathy.   Psych:  Cognition and judgment appear intact. Alert and cooperative with normal attention span and concentration. No apparent delusions, illusions, hallucinations   Impression & Recommendations:  Problem # 1:  ABSCESS, PERITONSILLAR, LEFT (ICD-475) Assessment Deteriorated I called Dr. Haroldine Laws and he will see her now. Orders: ENT Referral (ENT)  Complete Medication List: 1)  Pantoprazole Sodium 40 Mg Tbec (Pantoprazole sodium) .Marland Kitchen.. 1 by mouth q am 2)  Citracal Plus Tabs (Multiple minerals-vitamins) .... Take 2 by mouth once daily 3)  Fibercon 625 Mg Tabs (Calcium polycarbophil) .... Take 2 by mouth once daily 4)  Ropinirole Hcl 0.25 Mg Tabs (Ropinirole hcl) .... 3 tabs at bedtime 5)  Zyrtec-d Allergy & Congestion 5-120 Mg Xr12h-tab (Cetirizine-pseudoephedrine) .... Take 1 tablet by mouth every 12 hours 6)  Vitamin D3 1000 Unit Tabs (Cholecalciferol) .... 2 by mouth once daily 7)  Robitussin Cough Long-acting 15 Mg/56ml Syrp (Dextromethorphan hbr) .... Take as needed as directed 8)  Mucinex Dm 30-600 Mg Tb12 (Dextromethorphan-guaifenesin)  .... Take as needed as directed 9)  Glucosamine-chondroitin 1500-1200 Mg/68ml Liqd (Glucosamine-chondroitin) .... Double strength take 1 tablet by mouth three times a day 10)  Flax Seed Oil 1000 Mg Caps (Flaxseed (linseed)) .... Take 3 tablet by mouth two times a day 11)  Aleve 220 Mg Tabs (Naproxen sodium) .... Take 1 tab by mouth at bedtime as needed 12)  Advil 200 Mg Tabs (Ibuprofen) .... Take 1 tab by mouth at bedtime as needed 13)  Triple Flex 2  14)  Zithromax Tri-pak 500 Mg Tab (Azithromycin) .... Take as directed one by mouth once daily for 3 days

## 2010-03-06 NOTE — Progress Notes (Signed)
  Phone Note Call from Patient Call back at [facility] Phone [phone]   Summary of Call: Pt c/o continued throat pain, swelling and difficulty swallowing. She would like results of strep test and wants to know if she needs additional antibiotic.  Initial call taken by: Lamar Sprinkles, CMA,  April 07, 2009 9:16 AM  Follow-up for Phone Call        strep test and culture were negative, CBC showed a high WBC, mono test was negative but it usually is early on, this likes and sounds viral so I don't think another antibiotic would be helpful, it's just going to take some time Follow-up by: Etta Grandchild MD,  April 07, 2009 9:18 AM  Additional Follow-up for Phone Call Additional follow up Details #1::        Any suggestions to help with difficulty swallowing?  Additional Follow-up by: Lamar Sprinkles, CMA,  April 07, 2009 10:18 AM    Additional Follow-up for Phone Call Additional follow up Details #2::    I need to look at her throat again, can she come in today? Follow-up by: Etta Grandchild MD,  April 07, 2009 10:38 AM  Additional Follow-up for Phone Call Additional follow up Details #3:: Details for Additional Follow-up Action Taken: Pt informed, scheduled for office visit today Additional Follow-up by: Lamar Sprinkles, CMA,  April 07, 2009 10:41 AM

## 2010-03-06 NOTE — Letter (Signed)
Summary: *Referral Letter   [facility]  [address] [place], [place] [address]   Phone: [phone]  Fax: [phone]    12/14/2009  Dear Dr. Annalee Genta,  Thank you in advance for agreeing to see my patient:  Suzanne Schaefer [address] [place], [place]  [address]  Phone: [phone]  Reason for Referral: consideration of elective tonsillectomy. She has had multiple infections. Her CT reveals changes consistent with recurrent infections.   Procedures Requested:   Current Medical Problems: 1)  ABSCESS, PERITONSILLAR, LEFT (ICD-475) 2)  EXUDATIVE PHARYNGITIS (ICD-462) 3)  EAR PAIN, LEFT (ICD-388.70) 4)  BACK PAIN (ICD-724.5) 5)  KNEE PAIN, LEFT (ICD-719.46) 6)  DIVERTICULOSIS OF COLON (ICD-562.10) 7)  OBESITY, UNSPECIFIED (ICD-278.00) 8)  ALLERGIC RHINITIS (ICD-477.9) 9)  ROUTINE GENERAL MEDICAL EXAM@HEALTH  CARE FACL (ICD-V70.0) 10)  RESTLESS LEG SYNDROME (ICD-333.94) 11)  OBSTRUCTIVE SLEEP APNEA (ICD-327.23)   Current Medications: 1)  PANTOPRAZOLE SODIUM 40 MG TBEC (PANTOPRAZOLE SODIUM) 1 by mouth q AM 2)  CITRACAL PLUS   TABS (MULTIPLE MINERALS-VITAMINS) take 2 by mouth once daily 3)  FIBERCON 625 MG  TABS (CALCIUM POLYCARBOPHIL) take 2 by mouth once daily 4)  ROPINIROLE HCL 0.25 MG  TABS (ROPINIROLE HCL) 3 tabs at bedtime 5)  ZYRTEC-D ALLERGY & CONGESTION 5-120 MG XR12H-TAB (CETIRIZINE-PSEUDOEPHEDRINE) Take 1 tablet by mouth every 12 hours 6)  VITAMIN D3 1000 UNIT  TABS (CHOLECALCIFEROL) 2 by mouth once daily 7)  ROBITUSSIN COUGH LONG-ACTING 15 MG/5ML  SYRP (DEXTROMETHORPHAN HBR) take as needed as directed 8)  MUCINEX DM 30-600 MG  TB12 (DEXTROMETHORPHAN-GUAIFENESIN) take as needed as directed 9)  GLUCOSAMINE-CHONDROITIN 1500-1200 MG/30ML  LIQD (GLUCOSAMINE-CHONDROITIN) DOUBLE STRENGTH Take 1 tablet by mouth three times a day 10)  FLAX SEED OIL 1000 MG  CAPS (FLAXSEED (LINSEED)) Take 3 tablet by mouth two times a day 11)  ALEVE 220 MG TABS  (NAPROXEN SODIUM) Take 1 tab by mouth at bedtime as needed 12)  ADVIL 200 MG TABS (IBUPROFEN) Take 1 tab by mouth at bedtime as needed 13)  * TRIPLE FLEX 2  14)  ALLEGRA-D ALLERGY & CONGESTION 60-120 MG XR12H-TAB (FEXOFENADINE-PSEUDOEPHEDRINE) 1 tab daily 15)  FLUCONAZOLE 100 MG TABS (FLUCONAZOLE) 1 by mouth once daily x 14 days for pharyngitis. 16)  SERTRALINE HCL 50 MG TABS (SERTRALINE HCL) 1 by mouth once daily for anxiety and depression   Past Medical History: 1)   UCD 2)  ROUTINE GENERAL MEDICAL EXAM@HEALTH  CARE FACL (ICD-V70.0) 3)  RESTLESS LEG SYNDROME (ICD-333.94) 4)  OBSTRUCTIVE SLEEP APNEA (ICD-327.23) 5)  Tonsillitis 6)  esophagitis 7)  Allergic rhinitis 8)  hx of sinusitis 9)  Obesity   Prior History of Blood Transfusions: no (04/05/2009 9:15:48 AM)  Pertinent Labs: see attached CT report   Thank you again for agreeing to see our patient; please contact us if you have any further questions or need additional information.  Sincerely,  Jacques Navy MD

## 2010-03-06 NOTE — Progress Notes (Signed)
  Phone Note Call from Patient   Summary of Call: Pt needs rx for antibiotic for abcess on tonsil per DR Initial call taken by: Lamar Sprinkles, CMA,  May 02, 2009 6:00 PM  Follow-up for Phone Call        OK for duricef Follow-up by: Jacques Navy MD,  May 03, 2009 9:01 AM    New/Updated Medications: CEFADROXIL 500 MG/5ML SUSR (CEFADROXIL) 10 cc two times a day x 10 day Prescriptions: CEFADROXIL 500 MG/5ML SUSR (CEFADROXIL) 10 cc two times a day x 10 day  #200 x 0   Entered by:   Lamar Sprinkles, CMA   Authorized by:   Jacques Navy MD   Signed by:   Lamar Sprinkles, CMA on 05/02/2009   Method used:   Electronically to        [facility]. [phone]* (retail)       [address]       [place], [place]  [address]       Ph: [phone]       Fax: [phone]   RxID:   [id number]

## 2010-03-06 NOTE — Progress Notes (Signed)
Summary: Sore throat  Phone Note Call from Patient   Summary of Call: Pt started w/sore throat at the end of Feb 2011. She has had 2 abcesses and chronic sore/raw throat. Pt has been using otc antihistamines, gargle and all other advised treatments w/only little relief of symptoms. She was seen by Dr Ezzard Standing, Dr Haroldine Laws and yesterday Dr Kelli Churn. First advisement was tonsilectomy by Dr Haroldine Laws if symptoms continued. She was not satisfied by Dr Haroldine Laws due to the fact that during the office visit she was asked multiple times why she was being seen.  After giving the full story of symptoms from start to current to Dr Kelli Churn she was given no advisement from MD. Was told he did not reccomend tonsilectomy and to f/u as needed. She is very frustrated with chronic sore throat and feels she has gotten no answers. Pt currently c/o raw sore throat and side of neck is sore to touch.  Initial call taken by: Lamar Sprinkles, CMA,  October 05, 2009 11:19 AM  Follow-up for Phone Call        FYI, Pt left mess for Dr Elvis Coil office last week and has yet to hear back from their office. Advised pt to be persistent with ENT, she still wants Dr Debby Bud oppinion Follow-up by: Lamar Sprinkles, CMA,  October 17, 2009 2:27 PM

## 2010-03-06 NOTE — Progress Notes (Signed)
Summary: Cancer concerns - Aware MD out of office till Thursday  Phone Note Call from Patient   Summary of Call: Spoke w/ pt on Friday. She has continued sore throat. Symptoms are managed by gargling two times a day everyday. Pt's paternal grandfather had throat/neck cancer. All her fathers siblings have had cancer, lung, blood and kidney. Pt's dentist recently had a patient w/chronic sore throat, which ended up being cancer.  Initial call taken by: Lamar Sprinkles, CMA,  July 10, 2009 8:58 AM  Follow-up for Phone Call        chart reveiwed: diagnosed with recurrent tonsilitis and one episode of tonsilar abscess requiring I&D. She was supposed to return to Dr. Haroldine Laws for tonsilectomy. Has she done this? At the time she is evaluated by Dr. Haroldine Laws for tonsilectomy he can perform larygnoscopy to look for any other abnormalities, i.e. throat cancer.  It is very unusual to see throat cancer in a non-smoker, non-drinker. Also, the usual presentation is hoarseness, difficulty swallowing and less likely "sore throat." Follow-up by: Jacques Navy MD,  July 12, 2009 5:10 PM  Additional Follow-up for Phone Call Additional follow up Details #1::        Hold for sarah to discuss w/pt..........Marland KitchenLamar Sprinkles, CMA  July 14, 2009 6:17 PM   Pt informed, she has found white mold under her home. When being away from home for a few days she has relief in symptoms. Pt will treat mold this weekend.  Additional Follow-up by: Lamar Sprinkles, CMA,  July 21, 2009 2:29 PM

## 2010-03-06 NOTE — Consult Note (Signed)
Summary: Hermelinda Medicus MD (ENT)  Hermelinda Medicus MD (ENT)   Imported By: Sherian Rein 04/17/2009 09:47:04  _____________________________________________________________________  External Attachment:    Type:   Image     Comment:   External Document

## 2010-03-06 NOTE — Progress Notes (Signed)
Summary: REFERRAL  Phone Note Call from Patient   Summary of Call: Pt is req to proceed with CT of neck/throat.  Initial call taken by: Lamar Sprinkles, CMA,  November 29, 2009 1:56 PM  Follow-up for Phone Call        [facility] notified Follow-up by: Jacques Navy MD,  November 29, 2009 2:08 PM

## 2010-03-06 NOTE — Letter (Signed)
Summary: Out of Work  [facility]  [address] [place], [place] [address]   Phone: [phone]  Fax: [phone]    April 05, 2009   Employee:  AMARE KONTOS    To Whom It May Concern:   For Medical reasons, please excuse the above named employee from work for the following dates:  Start:   04/05/09  End:   04/10/09  If you need additional information, please feel free to contact our office.         Sincerely,    Etta Grandchild MD

## 2010-03-06 NOTE — Assessment & Plan Note (Signed)
Summary: DR MEN PT SORE THROAT EAR PAIN FEVER  STC   Vital Signs:  Patient profile:   50 year old female Height:      66 inches Weight:      211 pounds BMI:     34.18 O2 Sat:      97 % on Room air Temp:     99.1 degrees F oral Pulse rate:   95 / minute Pulse rhythm:   regular Resp:     16 per minute BP sitting:   118 / 70  (left arm) Cuff size:   large  Vitals Entered By: Rock Nephew CMA (April 05, 2009 9:25 AM)  O2 Flow:  Room air CC: fever, sore throat, Bilateral ear pain x 3 days, URI symptoms Is Patient Diabetic? No Pain Assessment Patient in pain? yes     Location: Ears   Primary Care Provider:  Norins  CC:  fever, sore throat, Bilateral ear pain x 3 days, and URI symptoms.  History of Present Illness:  URI Symptoms      This is a 50 year old woman who presents with URI symptoms.  The symptoms began 4 days ago.  The severity is described as severe.  The patient reports sore throat, earache, and sick contacts, but denies nasal congestion, clear nasal discharge, purulent nasal discharge, dry cough, and productive cough.  Associated symptoms include fever of 100.5-103 degrees, use of an antipyretic, and response to antipyretic.  The patient denies stiff neck, dyspnea, wheezing, rash, vomiting, and diarrhea.  The patient denies headache, muscle aches, and severe fatigue.  Risk factors for Strep sinusitis include Strep exposure, tender adenopathy, and absence of cough.  The patient denies the following risk factors for Strep sinusitis: unilateral facial pain, unilateral nasal discharge, double sickening, and tooth pain.    Preventive Screening-Counseling & Management  Alcohol-Tobacco     Alcohol drinks/day: 0     Smoking Status: never     Passive Smoke Exposure: no  Hep-HIV-STD-Contraception     Hepatitis Risk: no risk noted     HIV Risk: no risk noted     STD Risk: no risk noted      Sexual History:  currently monogamous.        Drug Use:  never.        Blood  Transfusions:  no.    Medications Prior to Update: 1)  Pantoprazole Sodium 40 Mg Tbec (Pantoprazole Sodium) .Marland Kitchen.. 1 By Mouth Q Am 2)  Citracal Plus   Tabs (Multiple Minerals-Vitamins) .... Take 2 By Mouth Once Daily 3)  Fibercon 625 Mg  Tabs (Calcium Polycarbophil) .... Take 2 By Mouth Once Daily 4)  Ropinirole Hcl 0.25 Mg  Tabs (Ropinirole Hcl) .... 3 Tabs At Bedtime 5)  Zyrtec-D Allergy & Congestion 5-120 Mg Xr12h-Tab (Cetirizine-Pseudoephedrine) .... Take 1 Tablet By Mouth Every 12 Hours 6)  Vitamin D3 1000 Unit  Tabs (Cholecalciferol) .... 2 By Mouth Once Daily 7)  Robitussin Cough Long-Acting 15 Mg/36ml  Syrp (Dextromethorphan Hbr) .... Take As Needed As Directed 8)  Mucinex Dm 30-600 Mg  Tb12 (Dextromethorphan-Guaifenesin) .... Take As Needed As Directed 9)  Glucosamine-Chondroitin 1500-1200 Mg/81ml  Liqd (Glucosamine-Chondroitin) .... Double Strength Take 1 Tablet By Mouth Three Times A Day 10)  Flax Seed Oil 1000 Mg  Caps (Flaxseed (Linseed)) .... Take 3 Tablet By Mouth Two Times A Day 11)  Aleve 220 Mg Tabs (Naproxen Sodium) .... Take 1 Tab By Mouth At Bedtime As Needed 12)  Advil  200 Mg Tabs (Ibuprofen) .... Take 1 Tab By Mouth At Bedtime As Needed  Current Medications (verified): 1)  Pantoprazole Sodium 40 Mg Tbec (Pantoprazole Sodium) .Marland Kitchen.. 1 By Mouth Q Am 2)  Citracal Plus   Tabs (Multiple Minerals-Vitamins) .... Take 2 By Mouth Once Daily 3)  Fibercon 625 Mg  Tabs (Calcium Polycarbophil) .... Take 2 By Mouth Once Daily 4)  Ropinirole Hcl 0.25 Mg  Tabs (Ropinirole Hcl) .... 3 Tabs At Bedtime 5)  Zyrtec-D Allergy & Congestion 5-120 Mg Xr12h-Tab (Cetirizine-Pseudoephedrine) .... Take 1 Tablet By Mouth Every 12 Hours 6)  Vitamin D3 1000 Unit  Tabs (Cholecalciferol) .... 2 By Mouth Once Daily 7)  Robitussin Cough Long-Acting 15 Mg/52ml  Syrp (Dextromethorphan Hbr) .... Take As Needed As Directed 8)  Mucinex Dm 30-600 Mg  Tb12 (Dextromethorphan-Guaifenesin) .... Take As Needed As  Directed 9)  Glucosamine-Chondroitin 1500-1200 Mg/51ml  Liqd (Glucosamine-Chondroitin) .... Double Strength Take 1 Tablet By Mouth Three Times A Day 10)  Flax Seed Oil 1000 Mg  Caps (Flaxseed (Linseed)) .... Take 3 Tablet By Mouth Two Times A Day 11)  Aleve 220 Mg Tabs (Naproxen Sodium) .... Take 1 Tab By Mouth At Bedtime As Needed 12)  Advil 200 Mg Tabs (Ibuprofen) .... Take 1 Tab By Mouth At Bedtime As Needed 13)  Triple Flex 2 14)  Zithromax Tri-Pak 500 Mg Tab (Azithromycin) .... Take As Directed One By Mouth Once Daily For 3 Days  Allergies (verified): No Known Drug Allergies  Past History:  Past Medical History: Reviewed history from 01/20/2007 and no changes required.  UCD ROUTINE GENERAL MEDICAL EXAM@HEALTH  CARE FACL (ICD-V70.0) RESTLESS LEG SYNDROME (ICD-333.94) OBSTRUCTIVE SLEEP APNEA (ICD-327.23) Tonsillitis esophagitis Allergic rhinitis hx of sinusitis Obesity  Past Surgical History: Reviewed history from 01/20/2007 and no changes required. cervical biopsy  Lap cholecystectomy '92 Bilateral salpingectomy C-section x 23  Family History: Reviewed history from 01/20/2007 and no changes required. father- 10; HTN, glaucoma, obesity mother - 43; Asthma, COPD Neg- breast, colon cancer; DM, CAD  Social History: Reviewed history from 01/20/2007 and no changes required. 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  The patient denies anorexia, chest pain, prolonged cough, headaches, hemoptysis, and abdominal pain.   ENT:  Complains of earache and sore throat; denies difficulty swallowing, ear discharge, hoarseness, and sinus pressure.  Physical  Exam  General:  alert, well-developed, well-nourished, well-hydrated, appropriate dress, normal appearance, healthy-appearing, cooperative to examination, and good hygiene.   Head:  normocephalic, atraumatic, no abnormalities observed, and no abnormalities palpated.   Eyes:  vision grossly intact, pupils equal, pupils round, and pupils reactive to light.   Ears:  R ear normal and L ear normal.   Mouth:  good dentition, no pharyngeal crowding, pharyngeal erythema, tonsil hypertropied, and pharyngeal exudate.   Neck:  supple, no masses, no thyromegaly, no JVD, and cervical lymphadenopathy.   Lungs:  normal respiratory effort, no intercostal retractions, no accessory muscle use, normal breath sounds, no dullness, no fremitus, no crackles, and no wheezes.   Heart:  normal rate, regular rhythm, no murmur, no gallop, no rub, and no JVD.   Abdomen:  soft, non-tender, normal bowel  sounds, no distention, no masses, no hepatomegaly, and no splenomegaly.   Msk:  No deformity or scoliosis noted of thoracic or lumbar spine.   Pulses:  R and L carotid,radial,femoral,dorsalis pedis and posterior tibial pulses are full and equal bilaterally Extremities:  No clubbing, cyanosis, edema, or deformity noted with normal full range of motion of all joints.   Neurologic:  No cranial nerve deficits noted. Station and gait are normal. Plantar reflexes are down-going bilaterally. DTRs are symmetrical throughout. Sensory, motor and coordinative functions appear intact. Skin:  Intact without suspicious lesions or rashes Cervical Nodes:  R anterior LN tender and L anterior LN tender.   Axillary Nodes:  no R axillary adenopathy and no L axillary adenopathy.   Psych:  Oriented X3, memory intact for recent and remote, normally interactive, and good eye contact.     Impression & Recommendations:  Problem # 1:  EXUDATIVE PHARYNGITIS (ICD-462) Assessment New  Her updated medication list for this problem includes:    Aleve  220 Mg Tabs (Naproxen sodium) .Marland Kitchen... Take 1 tab by mouth at bedtime as needed    Advil 200 Mg Tabs (Ibuprofen) .Marland Kitchen... Take 1 tab by mouth at bedtime as needed    Zithromax Tri-pak 500 Mg Tab (Azithromycin) .Marland Kitchen... Take as directed one by mouth once daily for 3 days  Orders: Venipuncture (56213) T-Culture, Throat ([id number]) Admin of Therapeutic Inj  intramuscular or subcutaneous (29528) Depo- Medrol 40mg  (J1030) Depo- Medrol 80mg  (J1040) TLB-CBC Platelet - w/Differential (85025-CBCD) TLB-Mono Test (Monospot) (41324-MWNU)  Instructed to complete antibiotics and call if not improved in 48 hours.   Complete Medication List: 1)  Pantoprazole Sodium 40 Mg Tbec (Pantoprazole sodium) .Marland Kitchen.. 1 by mouth q am 2)  Citracal Plus Tabs (Multiple minerals-vitamins) .... Take 2 by mouth once daily 3)  Fibercon 625 Mg Tabs (Calcium polycarbophil) .... Take 2 by mouth once daily 4)  Ropinirole Hcl 0.25 Mg Tabs (Ropinirole hcl) .... 3 tabs at bedtime 5)  Zyrtec-d Allergy & Congestion 5-120 Mg Xr12h-tab (Cetirizine-pseudoephedrine) .... Take 1 tablet by mouth every 12 hours 6)  Vitamin D3 1000 Unit Tabs (Cholecalciferol) .... 2 by mouth once daily 7)  Robitussin Cough Long-acting 15 Mg/78ml Syrp (Dextromethorphan hbr) .... Take as needed as directed 8)  Mucinex Dm 30-600 Mg Tb12 (Dextromethorphan-guaifenesin) .... Take as needed as directed 9)  Glucosamine-chondroitin 1500-1200 Mg/71ml Liqd (Glucosamine-chondroitin) .... Double strength take 1 tablet by mouth three times a day 10)  Flax Seed Oil 1000 Mg Caps (Flaxseed (linseed)) .... Take 3 tablet by mouth two times a day 11)  Aleve 220 Mg Tabs (Naproxen sodium) .... Take 1 tab by mouth at bedtime as needed 12)  Advil 200 Mg Tabs (Ibuprofen) .... Take 1 tab by mouth at bedtime as needed 13)  Triple Flex 2  14)  Zithromax Tri-pak 500 Mg Tab (Azithromycin) .... Take as directed one by mouth once daily for 3 days  Patient Instructions: 1)  Please schedule a  follow-up appointment in 2 weeks. 2)  Get plenty of rest, drink lots of clear liquids, and use Tylenol or Ibuprofen for fever and comfort. Return in 7-10 days if you're not better:sooner if you're feeling worse. 3)  Take 650-1000mg  of Tylenol every 4-6 hours as needed for relief of pain or comfort of fever AVOID taking more than 4000mg   in a 24 hour period (can cause liver damage in higher doses). 4)  Take 400-600mg  of Ibuprofen (Advil, Motrin) with food every 4-6 hours as needed for relief of  pain or comfort of fever. 5)  Take your antibiotic as prescribed until ALL of it is gone, but stop if you develop a rash or swelling and contact our office as soon as possible. Prescriptions: ZITHROMAX TRI-PAK 500 MG TAB (AZITHROMYCIN) Take as directed one by mouth once daily for 3 days  #3 x 0   Entered and Authorized by:   Etta Grandchild MD   Signed by:   Etta Grandchild MD on 04/05/2009   Method used:   Electronically to        [facility] [phone]* (retail)       [address]       [place], [place]  [address]       Ph: [phone]       Fax: [phone]   RxID:   [id number]    Medication Administration  Injection # 1:    Medication: Depo- Medrol 80mg     Diagnosis: EXUDATIVE PHARYNGITIS (ICD-462)    Route: IM    Site: R deltoid    Exp Date: 12/2011    Lot #: [id number]    Mfr: pfizer    Patient tolerated injection without complications    Given by: Rock Nephew CMA (April 05, 2009 10:03 AM)  Injection # 2:    Medication: Depo- Medrol 40mg     Diagnosis: EXUDATIVE PHARYNGITIS (ICD-462)    Route: IM    Site: R deltoid    Exp Date: 12/2011    Lot #: [id number]    Mfr: pfizer    Patient tolerated injection without complications    Given by: Rock Nephew CMA (April 05, 2009 10:03 AM)  Orders Added: 1)  Venipuncture [20254] 2)  T-Culture, Throat [[id number]] 3)  Est. Patient Level IV [31517] 4)  Admin of Therapeutic Inj  intramuscular or subcutaneous  [96372] 5)  Depo- Medrol 40mg  [J1030] 6)  Depo- Medrol 80mg  [J1040] 7)  TLB-CBC Platelet - w/Differential [85025-CBCD] 8)  TLB-Mono Test (Monospot) [86308-MONO] 9)  Est. Patient Level IV [61607]

## 2010-03-06 NOTE — Progress Notes (Signed)
  Phone Note Other Incoming   Caller: pt  Summary of Call: pt works down stairs and was seen by Dr Yetta Barre on the 4th of march. She also saw Crosley and he drained and absess and put her on clyndimycin (which pt has found out she is allergic to; causes a rash). Dr Joneen Roach is out of town and she is needing another alternative. Please Advise. Initial call taken by: Ami Bullins CMA,  April 14, 2009 10:09 AM  Follow-up for Phone Call        ok to change to augmentin - done hardcopy to LIM side B - dahlia  Follow-up by: Corwin Levins MD,  April 14, 2009 10:23 AM  Additional Follow-up for Phone Call Additional follow up Details #1::        pt informed and came upstairs and picked up RX Additional Follow-up by: Margaret Pyle, CMA,  April 14, 2009 10:37 AM   New Allergies: ! CLEOCIN New/Updated Medications: AMOXICILLIN-POT CLAVULANATE 875-125 MG TABS (AMOXICILLIN-POT CLAVULANATE) 1 by mouth two times a day New Allergies: ! CLEOCINPrescriptions: AMOXICILLIN-POT CLAVULANATE 875-125 MG TABS (AMOXICILLIN-POT CLAVULANATE) 1 by mouth two times a day  #20 x 0   Entered and Authorized by:   Corwin Levins MD   Signed by:   Corwin Levins MD on 04/14/2009   Method used:   Print then Give to Patient   RxID:   [id number]

## 2010-03-06 NOTE — Assessment & Plan Note (Signed)
   Primary Care Provider:  Sorah Falkenstein   History of Present Illness: Patient has had tonsillitis x 2 and now presents with recurrent throat pain and probable tonsilar abcess left despite being on Pen VK. NO fever. She is able to swallow.   Primary Care Provider:  Cherise Fedder   History of Present Illness: Patient has had tonsillitis x 2 and now presents with recurrent throat pain and probable tonsilar abcess left despite being on Pen VK. NO fever. She is able to swallow.  Allergies: 1)  ! Cleocin PMH-FH-SH reviewed-no changes except otherwise noted  Review of Systems  The patient denies anorexia, fever, weight loss, chest pain, dyspnea on exertion, abdominal pain, muscle weakness, difficulty walking, and enlarged lymph nodes.    Physical Exam  General:  alert, well-developed, well-nourished, and well-hydrated.   Head:  normocephalic and atraumatic.   Mouth:  left tonsil enlarged, erythematous with "pointing" abscess anteriorly, with frank exudate.   Impression & Recommendations:  Problem # 1:  ABSCESS, PERITONSILLAR, LEFT (ICD-475) Pencillin resistent infection.  Plan - duracef liquid 1000mg  three times a day           contact Dr. Haroldine Laws - needs tonsilectomy  Complete Medication List: 1)  Pantoprazole Sodium 40 Mg Tbec (Pantoprazole sodium) .Marland Kitchen.. 1 by mouth q am 2)  Citracal Plus Tabs (Multiple minerals-vitamins) .... Take 2 by mouth once daily 3)  Fibercon 625 Mg Tabs (Calcium polycarbophil) .... Take 2 by mouth once daily 4)  Ropinirole Hcl 0.25 Mg Tabs (Ropinirole hcl) .... 3 tabs at bedtime 5)  Zyrtec-d Allergy & Congestion 5-120 Mg Xr12h-tab (Cetirizine-pseudoephedrine) .... Take 1 tablet by mouth every 12 hours 6)  Vitamin D3 1000 Unit Tabs (Cholecalciferol) .... 2 by mouth once daily 7)  Robitussin Cough Long-acting 15 Mg/26ml Syrp (Dextromethorphan hbr) .... Take as needed as directed 8)  Mucinex Dm 30-600 Mg Tb12 (Dextromethorphan-guaifenesin) .... Take as needed as  directed 9)  Glucosamine-chondroitin 1500-1200 Mg/32ml Liqd (Glucosamine-chondroitin) .... Double strength take 1 tablet by mouth three times a day 10)  Flax Seed Oil 1000 Mg Caps (Flaxseed (linseed)) .... Take 3 tablet by mouth two times a day 11)  Aleve 220 Mg Tabs (Naproxen sodium) .... Take 1 tab by mouth at bedtime as needed 12)  Advil 200 Mg Tabs (Ibuprofen) .... Take 1 tab by mouth at bedtime as needed 13)  Triple Flex 2  14)  Penicillin V Potassium 500 Mg Tabs (Penicillin v potassium) .Marland Kitchen.. 1 by mouth qid x 7 for pharyngitis 15)  Cefadroxil 500 Mg/67ml Susr (Cefadroxil) .Marland Kitchen.. 10 cc two times a day x 10 day

## 2010-03-06 NOTE — Progress Notes (Signed)
  Phone Note Call from Patient   Summary of Call: Patient is requesting results of CT.  Initial call taken by: Lamar Sprinkles, CMA,  December 13, 2009 1:43 PM  Follow-up for Phone Call        refviewed report Recommend elective tonsillectomy Follow-up by: Jacques Navy MD,  December 14, 2009 12:50 PM

## 2010-03-06 NOTE — Letter (Signed)
Summary: Beather Arbour MD  Beather Arbour MD   Imported By: Sherian Rein 05/01/2009 13:36:52  _____________________________________________________________________  External Attachment:    Type:   Image     Comment:   External Document

## 2010-03-06 NOTE — Assessment & Plan Note (Signed)
Summary: evaluation on sore throat/ ab   Vital Signs:  Patient profile:   50 year old female Height:      66 inches Weight:      217 pounds BMI:     35.15 O2 Sat:      97 % on Room air Temp:     97.9 degrees F oral Pulse rate:   66 / minute BP sitting:   106 / 68  (left arm) Cuff size:   large  Vitals Entered By: Bill Salinas CMA (April 26, 2009 9:05 AM)  O2 Flow:  Room air CC: pt here with complaint of sore throat and soreness in her neck/ ab   Primary Care Provider:  Norins  CC:  pt here with complaint of sore throat and soreness in her neck/ ab.  History of Present Illness: Patient presents for sore throat. She had recently been treated for left tonsilar abscess with I&D by Dr. Haroldine Laws - no cultures done. She has had intermiitent recurrent pain. she was seen by her Gyn and had throat culture - verbal report from patient - negative study. continues to have minor pain left throat, no fever, no odynophagia.  Current Medications (verified): 1)  Pantoprazole Sodium 40 Mg Tbec (Pantoprazole Sodium) .Marland Kitchen.. 1 By Mouth Q Am 2)  Citracal Plus   Tabs (Multiple Minerals-Vitamins) .... Take 2 By Mouth Once Daily 3)  Fibercon 625 Mg  Tabs (Calcium Polycarbophil) .... Take 2 By Mouth Once Daily 4)  Ropinirole Hcl 0.25 Mg  Tabs (Ropinirole Hcl) .... 3 Tabs At Bedtime 5)  Zyrtec-D Allergy & Congestion 5-120 Mg Xr12h-Tab (Cetirizine-Pseudoephedrine) .... Take 1 Tablet By Mouth Every 12 Hours 6)  Vitamin D3 1000 Unit  Tabs (Cholecalciferol) .... 2 By Mouth Once Daily 7)  Robitussin Cough Long-Acting 15 Mg/64ml  Syrp (Dextromethorphan Hbr) .... Take As Needed As Directed 8)  Mucinex Dm 30-600 Mg  Tb12 (Dextromethorphan-Guaifenesin) .... Take As Needed As Directed 9)  Glucosamine-Chondroitin 1500-1200 Mg/51ml  Liqd (Glucosamine-Chondroitin) .... Double Strength Take 1 Tablet By Mouth Three Times A Day 10)  Flax Seed Oil 1000 Mg  Caps (Flaxseed (Linseed)) .... Take 3 Tablet By Mouth Two Times A  Day 11)  Aleve 220 Mg Tabs (Naproxen Sodium) .... Take 1 Tab By Mouth At Bedtime As Needed 12)  Advil 200 Mg Tabs (Ibuprofen) .... Take 1 Tab By Mouth At Bedtime As Needed 13)  Triple Flex 2  Allergies (verified): 1)  ! Cleocin  Review of Systems  The patient denies anorexia, fever, weight loss, decreased hearing, chest pain, abdominal pain, enlarged lymph nodes, and angioedema.    Physical Exam  General:  WNWD white female Ears:  left EAC clear and TM normal  Mouth:  throat clear without erythema, tonsils normal in size, tender to touch of tongue blade.    Impression & Recommendations:  Problem # 1:  EXUDATIVE PHARYNGITIS (ICD-462) recurrent pain with an unremarkable exam. Possible residual infection that is inapparent.  Plan - Pen VK 500mg  qid x 7   The following medications were removed from the medication list:    Amoxicillin-pot Clavulanate 875-125 Mg Tabs (Amoxicillin-pot clavulanate) .Marland Kitchen... 1 by mouth two times a day Her updated medication list for this problem includes:    Aleve 220 Mg Tabs (Naproxen sodium) .Marland Kitchen... Take 1 tab by mouth at bedtime as needed    Advil 200 Mg Tabs (Ibuprofen) .Marland Kitchen... Take 1 tab by mouth at bedtime as needed    Penicillin V Potassium 500 Mg Tabs (  Penicillin v potassium) .Marland Kitchen... 1 by mouth qid x 7 for pharyngitis  Complete Medication List: 1)  Pantoprazole Sodium 40 Mg Tbec (Pantoprazole sodium) .Marland Kitchen.. 1 by mouth q am 2)  Citracal Plus Tabs (Multiple minerals-vitamins) .... Take 2 by mouth once daily 3)  Fibercon 625 Mg Tabs (Calcium polycarbophil) .... Take 2 by mouth once daily 4)  Ropinirole Hcl 0.25 Mg Tabs (Ropinirole hcl) .... 3 tabs at bedtime 5)  Zyrtec-d Allergy & Congestion 5-120 Mg Xr12h-tab (Cetirizine-pseudoephedrine) .... Take 1 tablet by mouth every 12 hours 6)  Vitamin D3 1000 Unit Tabs (Cholecalciferol) .... 2 by mouth once daily 7)  Robitussin Cough Long-acting 15 Mg/48ml Syrp (Dextromethorphan hbr) .... Take as needed as  directed 8)  Mucinex Dm 30-600 Mg Tb12 (Dextromethorphan-guaifenesin) .... Take as needed as directed 9)  Glucosamine-chondroitin 1500-1200 Mg/54ml Liqd (Glucosamine-chondroitin) .... Double strength take 1 tablet by mouth three times a day 10)  Flax Seed Oil 1000 Mg Caps (Flaxseed (linseed)) .... Take 3 tablet by mouth two times a day 11)  Aleve 220 Mg Tabs (Naproxen sodium) .... Take 1 tab by mouth at bedtime as needed 12)  Advil 200 Mg Tabs (Ibuprofen) .... Take 1 tab by mouth at bedtime as needed 13)  Triple Flex 2  14)  Penicillin V Potassium 500 Mg Tabs (Penicillin v potassium) .Marland Kitchen.. 1 by mouth qid x 7 for pharyngitis Prescriptions: PENICILLIN V POTASSIUM 500 MG TABS (PENICILLIN V POTASSIUM) 1 by mouth qid x 7 for pharyngitis  #28 x 0   Entered and Authorized by:   Jacques Navy MD   Signed by:   Jacques Navy MD on 04/26/2009   Method used:   Electronically to        [facility] [phone]* (retail)       [address]       [place], [place]  [address]       Ph: [phone]       Fax: [phone]   RxID:   [id number]

## 2010-03-08 NOTE — Letter (Signed)
Summary: Beather Arbour MD  Beather Arbour MD   Imported By: Sherian Rein 01/31/2010 12:50:27  _____________________________________________________________________  External Attachment:    Type:   Image     Comment:   External Document

## 2010-03-08 NOTE — Assessment & Plan Note (Signed)
Summary: CPX /NWS  #   Vital Signs:  Patient profile:   50 year old female Height:      66 inches Weight:      259 pounds BMI:     41.95 O2 Sat:      99 % on Room air Temp:     97.3 degrees F oral Pulse rate:   75 / minute BP sitting:   128 / 80  (left arm) Cuff size:   large  Vitals Entered By: Bill Salinas CMA (January 18, 2010 1:23 PM)  O2 Flow:  Room air CC: pt here for cpx/ ab  Vision Screening:      Vision Comments: Pt has yearly eye exam 01/19/2010   Primary Care Provider:  Norins  CC:  pt here for cpx/ ab.  History of Present Illness: Patient presents for general physical exam. She jsut saw Dr. Nicholas Lose for routine gyn care. She reports that Dr. Nicholas Lose suggested she have an A1C for a minimally elevated serum glucose.   She continues to have intermittent sore throat. AT this time she is not planning to pursue tonsillectomy  She continues to battle weight management.   She continues to have significant sleepiness. Pulled and reviewed hard copy chart:  She had a sleep study De '06 with AHI 6 - mild OSA. With CPAP in titration study AHI 1.4. She does not use CPAP at this time  Current Medications (verified): 1)  Pantoprazole Sodium 40 Mg Tbec (Pantoprazole Sodium) .Marland Kitchen.. 1 By Mouth Q Am 2)  Citracal Plus   Tabs (Multiple Minerals-Vitamins) .... Take 2 By Mouth Once Daily 3)  Fibercon 625 Mg  Tabs (Calcium Polycarbophil) .... Take 2 By Mouth Once Daily 4)  Ropinirole Hcl 0.25 Mg  Tabs (Ropinirole Hcl) .... 3 Tabs At Bedtime 5)  Zyrtec-D Allergy & Congestion 5-120 Mg Xr12h-Tab (Cetirizine-Pseudoephedrine) .... Take 1 Tablet By Mouth Every 12 Hours 6)  Vitamin D3 1000 Unit  Tabs (Cholecalciferol) .... 2 By Mouth Once Daily 7)  Robitussin Cough Long-Acting 15 Mg/80ml  Syrp (Dextromethorphan Hbr) .... Take As Needed As Directed 8)  Mucinex Dm 30-600 Mg  Tb12 (Dextromethorphan-Guaifenesin) .... Take As Needed As Directed 9)  Glucosamine-Chondroitin 1500-1200 Mg/11ml  Liqd  (Glucosamine-Chondroitin) .... Double Strength Take 1 Tablet By Mouth Three Times A Day 10)  Flax Seed Oil 1000 Mg  Caps (Flaxseed (Linseed)) .... Take 3 Tablet By Mouth Two Times A Day 11)  Aleve 220 Mg Tabs (Naproxen Sodium) .... Take 1 Tab By Mouth At Bedtime As Needed 12)  Advil 200 Mg Tabs (Ibuprofen) .... Take 1 Tab By Mouth At Bedtime As Needed 13)  Triple Flex 2 14)  Allegra-D Allergy & Congestion 60-120 Mg Xr12h-Tab (Fexofenadine-Pseudoephedrine) .Marland Kitchen.. 1 Tab Daily 15)  Fluconazole 100 Mg Tabs (Fluconazole) .Marland Kitchen.. 1 By Mouth Once Daily X 14 Days For Pharyngitis. 16)  Sertraline Hcl 50 Mg Tabs (Sertraline Hcl) .Marland Kitchen.. 1 By Mouth Once Daily For Anxiety and Depression  Allergies (verified): 1)  ! Cleocin  Past History:  Past Medical History: Last updated: 01/20/2007  UCD ROUTINE GENERAL MEDICAL EXAM@HEALTH  CARE FACL (ICD-V70.0) RESTLESS LEG SYNDROME (ICD-333.94) OBSTRUCTIVE SLEEP APNEA (ICD-327.23) Tonsillitis esophagitis Allergic rhinitis hx of sinusitis Obesity  Past Surgical History: Last updated: 01/20/2007 cervical biopsy  Lap cholecystectomy '92 Bilateral salpingectomy C-section x 23  Family History: Last updated: 01/20/2007 father- 1940; HTN, glaucoma, obesity mother - 58; Asthma, COPD Neg- breast, colon cancer; DM, CAD  Social History: Last updated: 01/20/2007 HSG married 1981-divorce '89  2 daughters, '86, 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  Review of Systems       weight has gone up and down for little net change  Physical Exam  General:  ovefrweight white woman in no distress Head:  normocephalic, atraumatic, and no abnormalities observed.   Eyes:  vision grossly intact, pupils equal, pupils round, corneas and lenses clear, and no injection.   Ears:  External ear exam shows no significant lesions or deformities.  Otoscopic examination reveals clear canals, tympanic membranes are intact bilaterally without bulging,  retraction, inflammation or discharge. Hearing is grossly normal bilaterally. Nose:  no external deformity and no external erythema.   Mouth:  Oral mucosa and oropharynx without lesions or exudates.  Teeth in good repair. Tonsils not enlarged Neck:  supple, full ROM, no thyromegaly, and no carotid bruits.   Chest Wall:  No deformities, masses, or tenderness noted. Breasts:  deferred to gyn Lungs:  Normal respiratory effort, chest expands symmetrically. Lungs are clear to auscultation, no crackles or wheezes. Heart:  Normal rate and regular rhythm. S1 and S2 normal without gallop, murmur, click, rub or other extra sounds. Abdomen:  obese, soft and normal bowel sounds.   Genitalia:  deferred to gyn Msk:  normal ROM, no joint tenderness, no joint swelling, no joint warmth, no redness over joints, and no joint deformities.   Pulses:  2+ radial pulses Extremities:  trace distal LE edema Neurologic:  alert & oriented X3, cranial nerves II-XII intact, strength normal in all extremities, gait normal, and DTRs symmetrical and normal.   Skin:  turgor normal, color normal, no rashes, and no ulcerations.   Cervical Nodes:  no anterior cervical adenopathy and no posterior cervical adenopathy.   Psych:  Oriented X3, memory intact for recent and remote, normally interactive, and good eye contact.     Impression & Recommendations:  Problem # 1:  ANEMIA, IRON DEFICIENCY (ICD-280.9) Patient's chart reviewed - she had normal Hgb 18 months ago, but in 2007 was iron deficient with Iron Sat of 16.6 % and is now at 12.2%. She did have EGD and colonosocopy in '07 normal. No evidence of gyn blood loss.   Plan - labs: retic count, B12, Iron studies          iron rich diet - referred to [email or web address] for information.  Orders: T-Reticulocyte Count, Automated ([id number]) TLB-B12 + Folate Pnl ([id number]-B28/UXL) TLB-IBC Pnl (Iron/FE;Transferrin) (83550-IBC)  B12, Retic normal. Iron % saturation very low.    Plan - supplemental iron or if not tolerated day surgery for IV iron.  Problem # 2:  OBSTRUCTIVE SLEEP APNEA (ICD-327.23) Diagnosed several years ago but did not tolerate CPAP mask. She is chronically fatigued. See HPI for details of previous study  Plan - encouraged patient to consider reconsult with Dr. Shelle Iron and to try alternative CPAP devices, e.g. nasal mask.  Problem # 3:  EXUDATIVE PHARYNGITIS (ICD-462) Currently stable  Her updated medication list for this problem includes:    Aleve 220 Mg Tabs (Naproxen sodium) .Marland Kitchen... Take 1 tab by mouth at bedtime as needed    Advil 200 Mg Tabs (Ibuprofen) .Marland Kitchen... Take 1 tab by mouth at bedtime as needed  Problem # 4:  OBESITY, UNSPECIFIED (ICD-278.00) Chronic recurrent problem. she had lost 63 lbs but has gained it back (a jinx by Dr. Dorris Carnes says she).  Plan - wt mgt - smart food choices, portion size control and exericse.  Possibly consider bariatric surgery.   Problem # 5:  Preventive Health Care (ICD-V70.0) unremarkable history with no surgery, hospitalizations. Limited exam is normal. Lab results are normal except for Iron saturation which is very low. She is current with  mammography - Aug '11, current with colonoscopy Oct '07. Immunizations - Tetnus Dec '10.  In summary - generally doing well.   Will need iron replacement for anemia with iron loss. She did have colonoscopy in '07. If Hgb doesn't improve and stabilize with iron replacement will need to consider repeat work-up.   Checked old chart for labs: patient had serum glucose of 106 this year and had the same reading in '10 at which time an A1C was 5.9%. Will not repeat lab.   She will continue to work on [employer] with smart food choices, PORTION SIZE CONTROL and regular exercise.   Complete Medication List: 1)  Pantoprazole Sodium 40 Mg Tbec (Pantoprazole sodium) .Marland Kitchen.. 1 by mouth q am 2)  Citracal Plus Tabs (Multiple minerals-vitamins) .... Take 2 by mouth once  daily 3)  Fibercon 625 Mg Tabs (Calcium polycarbophil) .... Take 2 by mouth once daily 4)  Ropinirole Hcl 0.25 Mg Tabs (Ropinirole hcl) .... 3 tabs at bedtime 5)  Zyrtec-d Allergy & Congestion 5-120 Mg Xr12h-tab (Cetirizine-pseudoephedrine) .... Take 1 tablet by mouth every 12 hours 6)  Vitamin D3 1000 Unit Tabs (Cholecalciferol) .... 2 by mouth once daily 7)  Robitussin Cough Long-acting 15 Mg/82ml Syrp (Dextromethorphan hbr) .... Take as needed as directed 8)  Mucinex Dm 30-600 Mg Tb12 (Dextromethorphan-guaifenesin) .... Take as needed as directed 9)  Glucosamine-chondroitin 1500-1200 Mg/89ml Liqd (Glucosamine-chondroitin) .... Double strength take 1 tablet by mouth three times a day 10)  Flax Seed Oil 1000 Mg Caps (Flaxseed (linseed)) .... Take 3 tablet by mouth two times a day 11)  Aleve 220 Mg Tabs (Naproxen sodium) .... Take 1 tab by mouth at bedtime as needed 12)  Advil 200 Mg Tabs (Ibuprofen) .... Take 1 tab by mouth at bedtime as needed 13)  Triple Flex 2  14)  Allegra-d Allergy & Congestion 60-120 Mg Xr12h-tab (Fexofenadine-pseudoephedrine) .Marland Kitchen.. 1 tab daily 15)  Fluconazole 100 Mg Tabs (Fluconazole) .Marland Kitchen.. 1 by mouth once daily x 14 days for pharyngitis. 16)  Sertraline Hcl 50 Mg Tabs (Sertraline hcl) .Marland Kitchen.. 1 by mouth once daily for anxiety and depression   Orders Added: 1)  T-Reticulocyte Count, Automated [[id number]] 2)  TLB-B12 + Folate Pnl [[id number]-B12/FOL] 3)  TLB-IBC Pnl (Iron/FE;Transferrin) [83550-IBC] 4)  Est. Patient 40-64 years [99396] 5)  Est. Patient Level III [09811]     Preventive Care Screening  Mammogram:    Date:  09/29/2009    Results:  normal

## 2010-03-08 NOTE — Progress Notes (Signed)
  Phone Note Outgoing Call   Reason for Call: Discuss lab or test results Summary of Call: call patinet - reticulocyte count normal - normally active bone marrow.  Thanks Initial call taken by: Jacques Navy MD,  January 22, 2010 10:56 AM  Follow-up for Phone Call        Patient notified per MD..Marland KitchenAlvy Beal Archie CMA  January 23, 2010 4:30 PM

## 2010-04-05 ENCOUNTER — Telehealth: Payer: Self-pay | Admitting: Internal Medicine

## 2010-04-12 NOTE — Progress Notes (Signed)
Summary: RX?   Phone Note Call from Patient   Summary of Call: Patient c/o sinus congestion drainage, sore throat & productive cough. Mucus is discolored in the am but clear throughout the day. She had expired cough med w/codiene that helped last night. Pt has been using mucinex and delsym w/little relief. Does pt need antibiotic? Can she have refill of cough med?  Initial call taken by: Lamar Sprinkles, CMA,  April 05, 2010 2:33 PM  Follow-up for Phone Call        OK for refill on phenergan w/codein-called to pharm.  left msg for pt  Follow-up by: Jacques Navy MD,  April 05, 2010 6:38 PM    New/Updated Medications: PROMETHAZINE-CODEINE 6.25-10 MG/5ML SYRP (PROMETHAZINE-CODEINE) 1 tsp q 6 for cough Prescriptions: PROMETHAZINE-CODEINE 6.25-10 MG/5ML SYRP (PROMETHAZINE-CODEINE) 1 tsp q 6 for cough  #8 oz x 1   Entered and Authorized by:   Jacques Navy MD   Signed by:   Jacques Navy MD on 04/05/2010   Method used:   Telephoned to ...       [facility]  [place]. [phone]* (retail)       [address]       [place], [place]  [address]       Ph: [phone]       Fax: [phone]   RxID:   [id number]

## 2010-06-22 ENCOUNTER — Ambulatory Visit (HOSPITAL_BASED_OUTPATIENT_CLINIC_OR_DEPARTMENT_OTHER)
Admission: RE | Admit: 2010-06-22 | Discharge: 2010-06-22 | Disposition: A | Discharge status: Home / Self Care | Payer: 59 | Source: Ambulatory Visit | Attending: Gynecology | Admitting: Gynecology

## 2010-06-22 DIAGNOSIS — B977 Papillomavirus as the cause of diseases classified elsewhere: Secondary | ICD-10-CM | POA: Insufficient documentation

## 2010-06-22 DIAGNOSIS — K6289 Other specified diseases of anus and rectum: Secondary | ICD-10-CM | POA: Insufficient documentation

## 2010-06-22 DIAGNOSIS — N9089 Other specified noninflammatory disorders of vulva and perineum: Secondary | ICD-10-CM | POA: Insufficient documentation

## 2010-06-22 DIAGNOSIS — B079 Viral wart, unspecified: Secondary | ICD-10-CM | POA: Insufficient documentation

## 2010-06-22 DIAGNOSIS — L988 Other specified disorders of the skin and subcutaneous tissue: Secondary | ICD-10-CM | POA: Insufficient documentation

## 2010-06-22 LAB — POCT HEMOGLOBIN-HEMACUE: Hemoglobin: 10.4 g/dL — ABNORMAL LOW (ref 12.0–15.0)

## 2010-06-22 NOTE — Assessment & Plan Note (Signed)
[facility]                               PULMONARY OFFICE NOTE   ONEIDA, MCKAMEY                       MRN:          [id number]  DATE:08/30/2005                            DOB:          Sep 08, 1960    REASON FOR VISIT:  I saw Ms. Sturdy today after she had undergone her CPAP  titration study, and at a CPAP pressure setting of 11 cm of water her apnea  hypopnea index was reduced to 0.  Snoring was eliminated.  Sleep  architecture was stabilized, and she was observed in REM sleep;  however,  she did not have supine sleep at this pressure setting.  Also of note, is  that her periodic leg movement index was 3.  She says that regards to her  possible restless leg syndrome and periodic limb movement disorder, she does  not notice much of a difference in starting the iron.  She states that she  has not really noticed much as far as leg symptoms to begin with.  She has  said that she is also contemplating undergoing bariatric surgery and had  several questions regarding this.  Otherwise, she has not had any other  significant changes in her health or medications since her last visit.  At  this time, I will increase her CPAP pressure setting to 11 cm of water, and  then I will plan on following up with her in about six to eight weeks to  assess her tolerance and compliance with CPAP pressure at the higher  pressure setting.  Additionally, I will continue her on her vitamin C and  iron supplementation, and then plan on repeating her ferritin level at her  next follow up visit.  With regards to possible bariatric surgery, I  discussed in detail with the patient that her best option would be to  initially try a diet, exercise, and weight reduction program, but that if  this is unsuccessful, then bariatric surgery could be considered, but I  explained to her in detail the possible adverse outcomes that can be  associated with undergoing this type of procedure,  as well as the fairly  rigorous lifestyle that would need to be maintained postoperatively to  ensure a successful outcome.  Additionally, with regards to her iron-  deficiency state, she is due to follow up with Dr. Debby Bud for further  evaluation of this.                                   Coralyn Helling, MD   VS/MedQ  DD:  08/30/2005  DT:  08/30/2005  Job #:  [id number]   cc:   Rosalyn Gess. Norins, MD

## 2010-06-22 NOTE — Assessment & Plan Note (Signed)
[facility]                           GASTROENTEROLOGY OFFICE NOTE   Suzanne Schaefer, Suzanne Schaefer                       MRN:          [id number]  DATE:11/21/2005                            DOB:          12/13/1960    REASON FOR CONSULTATION:  Low iron.   Suzanne Schaefer is a pleasant 50 year old white female referred through the  courtesy of Dr. Craige Cotta and Dr. Debby Bud for evaluation. Mild anemia was noted on  routine testing. She was placed on iron several months ago. Suzanne Schaefer does  complain of pyrosis which is well controlled with Protonix. She denies  abdominal pain, change in bowel habits, melena or hematochezia. Stools are  dark due to iron. She denies menorrhagia, though she is still having  periods. In June of 2007, hemoglobin was 12.7, iron 42, percent saturation  9.8, and ferritin 15.9. She has a history of erosive esophagitis.   PAST MEDICAL HISTORY:  Pertinent for sleep apnea. She is status post  cholecystectomy, hemorrhoidectomy, tubal ligation and cesarean section x2.   FAMILY HISTORY:  Noncontributory.   MEDICATIONS:  1. Protonix.  2. Citrucel.  3. Requip.  4. Iron.   She has no allergies.   She does not smoke. She drinks rarely. She is divorced and works in Administrator  records.   REVIEW OF SYSTEMS:  Positive for frequent cough, leg swelling, joint pain,  night sweats, and fatigue. She also notes a sensation of something crawling  across her abdomen on her insides when she turns onto her left side.   PHYSICAL EXAMINATION:  On exam, she is an obese female. Pulse 72, blood  pressure 120/80, weight 273.  HEENT: EOMI. PERRLA. Sclerae are anicteric.  Conjunctivae are pink.  NECK:  Supple without thyromegaly, adenopathy or carotid bruits.  CHEST:  Clear to auscultation and percussion without adventitious sounds.  CARDIAC:  Regular rhythm; normal S1 S2.  There are no murmurs, gallops or  rubs.  ABDOMEN:  Bowel sounds are normoactive.  Abdomen is  soft, non-tender and non-  distended.  There are no abdominal masses, tenderness, splenic enlargement  or hepatomegaly.  EXTREMITIES:  Full range of motion.  No cyanosis, clubbing or edema.  RECTAL:  Deferred.   IMPRESSION:  Possible iron-deficiency anemia. A gastrointestinal bleeding  source is a possibility as well as menstrual blood loss.   RECOMMENDATION:  1. Colonoscopy. At the patient's request, I will do an upper endoscopy at      the same time, if colonoscopy is not diagnostic for a GI bleeding      source.  2. Serial Hemoccults.       Barbette Hair. Arlyce Dice, MD,FACG      RDK/MedQ  DD:  11/21/2005  DT:  11/22/2005  Job #:  [id number]

## 2010-06-22 NOTE — Procedures (Signed)
[name], Suzanne Schaefer                ACCOUNT NO.:  [id number]   MEDICAL RECORD NO.:  [id number]          PATIENT TYPE:  OUT   LOCATION:  SLEEP CENTER                 FACILITY[facility]  [facility]   PHYSICIAN:  Coralyn Helling, M.D.      DATE OF BIRTH:  April 13, 1960   DATE OF STUDY:  08/08/2005                              NOCTURNAL POLYSOMNOGRAM   REFERRING PHYSICIAN:  Coralyn Helling, M.D.   INDICATIONS FOR STUDY:  This is an individual who had undergone an overnight  polysomnogram on December20,2006 which showed a respiratory disturbance  index of 6 and an oxygen saturation nadir of 89%.  Of note is that she had a  significant supine and REM effect with her sleep apnea.  She returns to the  sleep lab for a CPAP titration study.   Medications are Requip, Fibercon, Citrucel, vitamin C, ferrous sulfate, and  Sudafed.   SLEEP ARCHITECTURE:  Total recording time was 475.5 minutes, total sleep  time was 387 minutes.  Sleep efficiency was 81% which is slightly reduced.  Sleep latency was 19.5 minutes which is slightly prolonged.  REM latency was  167.5 minutes which was prolonged.  The patient was observed in all stages  of sleep and there appeared to be a relative increase in the percentage of  REM sleep at 31% of this study.  The patient slept in predominantly the  nonsupine position.   RESPIRATORY DATA:  The patient was titrated from a CPAP pressure setting of  6 to 11 cm of water.  At 11-cm of water.  The apnea-hypopnea index was  reduced to 0, snoring was eliminated, and sleep architecture appeared to be  stabilized.  However, the patient was not observed in the supine position at  this pressure setting.   OXYGEN DATA:  The baseline oxygenation was 97%.  At a CPAP setting of 11 cm  of water the mean oxygenation in both REM and non-REM was 98%.   CARDIAC DATA:  EKG showed normal sinus rhythm with an average heart rate of  76.   <MOVEMENT PARASOMNIA>  The periodic limb movement index was 3.3   IMPRESSION:  This is a CPAP titration study. At a CPAP pressure setting of  11 cm of water the apnea-hypopnea index was reduced to 0, and snoring was  eliminated.  The patient was observed in REM sleep.  However, she was not  observed in supine sleep at this pressure setting.   RECOMMENDATIONS:  Would be to initiate CPAP at 11 cm of water and follow up  clinically for her tolerance of CPAP at this pressure setting.      Coralyn Helling, M.D.  Diplomat, Biomedical engineer of Sleep Medicine  Electronically Signed     VS/MEDQ  D:  08/16/2005 15:32:09  T:  08/16/2005 19:02:02  Job:  [id number]

## 2010-06-22 NOTE — Procedures (Signed)
NAMEARIELYS, Suzanne Schaefer                ACCOUNT NO.:  [id number]   MEDICAL RECORD NO.:  [id number]          PATIENT TYPE:  OUT   LOCATION:  SLEEP CENTER                 FACILITY[facility]  [facility]   PHYSICIAN:  Marcelyn Bruins, M.D. [facility] DATE OF BIRTH:  Feb 07, 1960   DATE OF STUDY:  01/23/2005                              NOCTURNAL POLYSOMNOGRAM   REFERRING PHYSICIAN:  Dr. Illene Regulus.   DATE OF STUDY:  January 23, 2005.   INDICATION FOR STUDY:  Hypersomnia with sleep apnea.   EPWORTH SCORE:  13.   SLEEP ARCHITECTURE:  The patient had total sleep time of 414 minutes with  adequate REM but never achieved slow wave sleep. Sleep onset latency was  rapid at 6-1/2 minutes and REM onset was somewhat dilated 150 minutes. Sleep  efficiency was fairly good at 93%.   RESPIRATORY DATA:  The patient was found to have 37 hypopneas and 4 apneas  for a respiratory disturbance index of 6 events per hour. Events were not  positional but they were clearly worse during REM. The patient was noted to  have moderate snoring and 157 nonspecific arousals.   OXYGEN DATA:  There was O2 desaturation as low as 89% associated with her  obstructive events.   CARDIAC DATA:  No clinically significant cardiac arrhythmias.   MOVEMENT/PARASOMNIA:  The patient was found to have 70 leg jerks with five  per hour resulting in arousal or awakening. There was no abnormal behavior  noted during the study.   IMPRESSION/RECOMMENDATIONS:  1.  Very mild obstructive sleep apnea with a respiratory disturbance index      of 6 events per hour and O2 saturation as low as 89%. However, the      patient was noted to have moderate snoring with large numbers of      nonspecific arousals which may also suggest a component of the upper      airway resistance syndrome. It is really unclear how much of the      patient's symptomatology is due to sleep disordered breathing, and how      much may be due to her movement disorder. Clinical correlation  is      suggested.  2.  Moderate numbers of leg jerks with significant sleep disruption. Does      the patient have a history consistent      with the restless leg syndrome or the periodic leg movement syndrome?      This issue may be the more significant contributor to the patient's      symptomatology.           ______________________________  Marcelyn Bruins, M.D. [facility]  Diplomate, American Board of Sleep  Medicine     KC/MEDQ  D:  02/01/2005 12:30:10  T:  02/01/2005 19:34:47  Job:  [id number]

## 2010-06-22 NOTE — Assessment & Plan Note (Signed)
[facility]                           PRIMARY CARE OFFICE NOTE   LAMYA, LAUSCH                       MRN:          [id number]  DATE:01/17/2006                            DOB:          1960/08/23    Ms. Suzanne Schaefer is a 50 year old woman well known to me in the practice,  followed for routine medical care who presents for wellness examination  at today's visit.   CHIEF COMPLAINT:  Patient is worried at this time about her ongoing  weight management problem and wants to consider bariatric surgery.  We  did discuss this, and I am supportive of her being considered for lap  band procedure.  I have completed a questionnaire in regards to tracking  her weight over the last 5 years, as well as having dictated a letter of  medical necessity for the patient.   Patient reports that she is doing well with her pulmonary problem with  sleep apnea and restless leg, for which she has been seeing Dr. Coralyn Helling.  She is doing well with CPAP.  She also finds that the Requip is  helping her restless leg syndrome.   Patient complains of pain in her left buttock, really it is the SI area.  It is worse at night when she turns over.   Patient complains of a fullness or discomfort under her right rib cage.  This can be prolonged with sitting.  Of note, she is status post  cholecystectomy.   GI:  Patient reports that when she has difficult bowel movement and  strains it at the commode, she will develop a hard knot in her right  upper quadrant, which she has to massage in order to help get relief.   Health Maintenance:  Patient is current and up-to-date with Dr. Nicholas Lose  since her last examination, November 2007.  Last mammogram was December  2006, and she is scheduled for a followup mammogram January 20, 2006.  She has had colorectal cancer screening with colonoscopy performed  December 03, 2005, with diverticulosis with no other abnormalities noted.  Patient also  underwent upper endoscopy which was a normal examination.   PAST SURGICAL HISTORY:  1. Patient has had a cervical biopsy.  2. C-section times 2.  3. Laparoscopic cholecystectomy in 1992.  4. Bilaterally salpingectomy in 1996.   MEDICAL ILLNESSES:  Patient has had the usual childhood diseases.  She  had preeclampsia with her pregnancies.  She has had acute tonsillitis.  She has a history of reflux esophagitis.  She is struggling with weight  management.   FAMILY HISTORY:  Stepsister undergoing a nephrectomy.  Family history is  positive for hypertension.  There is a positive family history for  asthma in her mother, and there is a positive family history for cancer  in her grandfather, unspecified type.  Her father has passed.   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  She is a single mother, and her daughters are doing well.  One daughter has just recently graduated from college in May of 2007,  one daughter has finished high school and started community college.  Both girls are at home.  There is some stress in that her stepfather has  been diagnosed with metastatic cancer, and a stepsister is undergoing  nephrectomy.   REVIEW OF SYSTEMS:  Negative for any constitutional problems, ENT,  cardiovascular, respiratory problems, GI as above.  No GU problems.   PHYSICAL EXAMINATION:  Temperature is 97.8, blood pressure 129/74, pulse  88.  Weight 278.  GENERAL APPEARANCE:  This is an obese Caucasian woman in no acute  distress.  HEENT:  Normocephalic, atraumatic, EACs and TMs were unremarkable.  Oropharynx with native dentition in good repair.  No buccal or palatal  lesions were noted.  Posterior pharynx was clear.  Conjunctivae and  sclerae were clear.  PERRLA, EOMI.  Funduscopic exam was normal.  NECK:  Supple without thyromegaly.  NODES: No adenopathy was noted in the cervical, supraclavicular region.  CHEST:  No CVA tenderness, no  deformities noted.  LUNGS:  Clear with no rales, wheezes, or rhonchi.  BREAST:  Deferred to gynecology.  CARDIOVASCULAR:  2+ radial pulse.  She had a quiet precordium with a  regular rate and rhythm with a question of a 2/6 systolic murmur, heard  best at the right sternal border.  No JVD, no carotid bruits.  ABDOMEN:  Obese with no tenderness or guarding or rebound.  Palpation is  hindered by her girth.  PELVIC AND RECTAL EXAMS:  Deferred to gynecology.  EXTREMITIES:  Without cyanosis, clubbing, edema or deformity.  DERM:  Patient has small callus at the base of the fifth digit on the  left foot.   LABORATORY DATA:  Hemoglobin 13.6 gm.  White count was 7600 with normal  differential.  Cholesterol is 194, triglycerides 100, HDL 48.2, LDL 126  and at goal.  Chemistries were unremarkable with a serum glucose of 112.  Creatinine was 0.8.  Mild elevation in SGOT at 53, otherwise labs are  normal.  TSH is normal at 1.18, urinalysis is negative.   ASSESSMENT/PLAN:  1. Weight management as above.  Patient will be referred for      consideration of bariatric surgery for lap band procedure.  2. Pulmonary.  Patient is stable at this time under the management of      Dr. Craige Cotta, and is tolerating her CPAP well.  3. MSK.  Patient with pain at the [facility] joint.  I saw no obvious      deformities.  This may be weight related.  I would recommend the      use of nonsteroidal anti-inflammatory medication.  4. GI.  Patient with an unremarkable examination.  I would advise that      she take a regular stool softener to avoid straining at stool.  5. Dyspepsia.  Patient will continue with Protonix 40 mg daily.  6. Iron deficiency.  Resolved.  7. Osteopenia.  Patient's gynecologist has recommended the use of      vitamin D supplement.  8. Health Maintenance.  Patient is current and up-to-date as noted.   In summary, is a very pleasant woman with problems outlined above, who does seem medically stable.  I do  think she is a good candidate for  bariatric surgery.     Rosalyn Gess Norins, MD  Electronically Signed    MEN/MedQ  DD: 01/17/2006  DT: 01/18/2006  Job #: [id number]   cc:   Ms. Vara Guardian

## 2010-06-22 NOTE — Assessment & Plan Note (Signed)
[facility]                             PULMONARY OFFICE NOTE   Suzanne, FERRAIOLO                       MRN:          [id number]  DATE:04/16/2006                            DOB:          1960-07-02    PULMONARY FOLLOWUP NOTE   I saw Suzanne Schaefer today in followup for her obstructive sleep apnea and  restless leg syndrome.  With regard to her restless leg syndrome, she is  doing quite well.  She, again, is now off of her iron supplementation.  She is currently using Requip 1 mg nightly.  She says that she does  notice a difference the nights that she forgets to take her Requip with  regard to her leg symptoms, but while she is on the Requip, her symptoms  are under reasonable control.  With regard to her sleep apnea, she says  she is trying to use the CPAP machine on a nightly basis, but can only  use it for about 4 hours.  She will wake up usually on her back, and she  says that she has been having more vivid dreams recently, and she gets a  pain in her back when she wakes up.  When she wakes up, she says that  she has difficulty falling back to sleep with the CPAP machine, and  therefore takes it off and sleeps for the rest of the night.  She is  still having symptoms of feeling sleepy during the day.  She also  complains of having some congestion in her nose.  She has gained  approximately 7 pounds since her last visit with me in November.  What I  will do is increase her CPAP pressure to 9 cm water with the aim of  possibly increasing her back to her original pressure setting of 11 cm  of water.  I have also given her a sample of Veramyst nasal spray, which  she is to use 2 sprays in each nostril nightly.  I will also have her  decrease her dose of Requip to 0.75 mg to see if she can tolerate this.  If she does, continue to taper the dose with the hope of eventually  being able to have her use her Requip on an as needed basis.  I again  have also  discussed with her the importance of diet, exercise, and  weight reduction.   I will follow up with her in approximately 4 months.     Coralyn Helling, MD  Electronically Signed    VS/MedQ  DD: 04/16/2006  DT: 04/18/2006  Job #: [id number]   cc:   Rosalyn Gess. Norins, MD

## 2010-06-22 NOTE — Assessment & Plan Note (Signed)
[facility]                               PULMONARY OFFICE NOTE   NICHOLAS, OSSA                       MRN:          [id number]  DATE:10/25/2005                            DOB:          [birth year]    I saw Ms. Kerwin today for follow up of her obstructive sleep apnea and  restless leg syndrome.  With regards to her sleep apnea, she says she is  having difficulty tolerating the use of her CPAP.  She says she is not  having any difficulty using the mask, and she is able to fall asleep with  it, but then a half an hour after she falls asleep, she persistently wakes  up.  She is only currently using her CPAP machine on the weekends because  she is concerned that, with the number of awakenings she has, she will be  too tired during the day and not be able to function at work.  With regards  to her restless leg syndrome, she says that she feels like her leg symptoms  have improved significantly, and this does not seem to be causing her nearly  as much difficulty with her sleep as it was before.  Her ferritin level has  improved to 35 from a baseline of 15.  She is also due to have an evaluation  by Dr. Arlyce Dice to assess what the etiology of her iron deficiency is.  She  was somewhat reluctant to do this, but I have encouraged her that this would  be in her best interest to determine what the possible cause of the iron  deficiency could be from.  At this time, what I will do is I will decrease  her CPAP pressure setting to 8 and I have encouraged her to try an increased  use of her CPAP machine to a nightly basis for the entire time that she is  asleep.  Then, as she is able to better tolerate the use of the CPAP, we  could empirically increase the pressure setting back to a goal of about 11  cm of water, as long as she is able to tolerate this.  With regards to her  restless leg syndrome, I will continue her on her Requip of 1 mg a day in  addition to  continuing her on her iron supplementation with vitamin C and I  would followup with her ferritin levels in approximately 2 to 3 months with  the hopes that, once her ferritin level is above 50, we could discontinue  her iron and vitamin C and then gradually taper her off of her Requip and  assess her symptoms as we are doing this.                                   Coralyn Helling, MD   VS/MedQ  DD:  10/25/2005  DT:  10/28/2005  Job #:  [id number]

## 2010-06-22 NOTE — Assessment & Plan Note (Signed)
[facility]                             PULMONARY OFFICE NOTE   [facility] Schaefer, Suzanne Schaefer                       MRN:          [id number]  DATE:01/02/2006                            DOB:          28-Feb-1960    DATE:  January 02, 2006.   I have received the results of Suzanne Schaefer's iron levels and her serum  Ferritin level is 55.4.  Therefore, I have advised her that she can  discontinue the use of her ferrous sulfate and vitamin C and that I  would monitor her for her symptoms status with regards to her restless  leg syndrome.  In the meantime she is to continue her use of Requip and  at her followup we would discuss possibly tapering her off of the  Requip.     Coralyn Helling, MD  Electronically Signed    VS/MedQ  DD: 01/02/2006  DT: 01/03/2006  Job #: [id number]

## 2010-06-22 NOTE — Assessment & Plan Note (Signed)
[facility]                               PULMONARY OFFICE NOTE   OLGA, BOURBEAU                       MRN:          [id number]  DATE:12/31/2005                            DOB:          1960/09/16    I saw Suzanne Schaefer today in followup for her obstructive sleep apnea and  restless leg syndrome.   Since her last visit, she had undergone a colonoscopy by Dr. Arlyce Dice, which  revealed diverticulosis. She also had had a gynecologic followup and was  started on vitamin D supplements.   She says that with regards to her sleep apnea, her main difficulty is that  she gets drooling during the middle course of the night which pools up in  her full face mask and as a result she wakes up, takes the mask off and then  goes back to sleep. She says however that she is not having any other  difficulties as far as using the mask and feels that with the decrease in  the pressure setting from 11 to 8 cm of water, this has improved her ability  to tolerate using the CPAP.   With regards to her restless leg syndrome, her symptoms are under reasonable  control. She says, however, that last week she forgot to take her dose or  Requip and did notice a rebound with her leg symptoms.   At this time, I had emphasized to her the importance of using her CPAP  machine for the entire night that she is asleep particularly during the  latter half of the night since this is when she would have the majority of  her REM sleep, which would in fact be when her sleep apnea would likely be  at its worst. I also offered to have her try using a different type of a  mask and possibly a chin strap, but she would prefer to continue using her  full face mask at the present time.   With regards to her restless leg syndrome, I will recheck her serum ferritin  level and if this is above 50, then would discontinue her iron supplement  and her vitamin C.  Then would work on trying to taper her  down to the  lowest dose possible for Requip and possibly even discontinuing it.   Followup will be in 3 months.     Coralyn Helling, MD  Electronically Signed    VS/MedQ  DD: 12/31/2005  DT: 12/31/2005  Job #: [id number]

## 2010-06-22 NOTE — Letter (Signed)
January 17, 2006    Laverle Hobby, Practice Administrator,  [facility]  [address]  [place],  [place] [address]   RE:  Suzanne Schaefer, Suzanne Schaefer  MRN:  [id number]  /  DOB:  16-Apr-1960   Dear Colleague,   Ms. Paysley Poplar is a very pleasant patient I have been following for  many years.  She is now 58.  Since I began taking care of her back in  the late 1980s, we have been working diligently in regards to weight  management and weight loss.  You will see in the attached summary form  that the patient's weight has varied some but never below 200 pounds,  with a BMI of 37.4, now at 278 pounds with a BMI of 49.2.   She has tried various weight loss programs including [facility],  [facility], Weight Watchers, and [facility].  She has  been tried on medications in the past.   Fortunately, Ms. Jerde has not yet developed any significant comorbid  disease, specifically no diabetes, no evidence of hyperlipidemia,  cardiovascular disease, or significant orthopedic disease.  However,  given her young age and her significant weight problem as well as  failure of efforts in the past to reduce her weight through other  conservative measures, I feel that surgery is imperative in regards to  her medical stability and health.   If I can provide any additional information besides that contained  herein, please do not hesitate to contact me.   I appreciate your assistance in the care of this nice patient.  I will  close in saying that I think a wrap band procedure would be an ideal way  for her to finally get her weight under good control in regards to her  overall health.    Sincerely,      Rosalyn Gess. Norins, MD  Electronically Signed    MEN/MedQ  DD: 01/17/2006  DT: 01/17/2006  Job #: [id number]

## 2010-07-20 NOTE — Op Note (Signed)
  NAME:  Suzanne Schaefer, Suzanne Schaefer                ACCOUNT NO.:  [id number]  MEDICAL RECORD NO.:  [id number]           PATIENT TYPE:  LOCATION:  [facility]                         FACILITY:  [facility]  PHYSICIAN:  Gretta Cool, M.D. DATE OF BIRTH:  06/07/1960  DATE OF PROCEDURE:  06/22/2010 DATE OF DISCHARGE:                              OPERATIVE REPORT   PREOPERATIVE DIAGNOSES:  Multiple recurrences human papillomavirus resistant to conservative management, vulvar and anal.  POSTOPERATIVE DIAGNOSES:  Multiple recurrences human papillomavirus, resistant to conservative management, vulvar and anal.  PROCEDURE:  Laser ablation and shave excision of extensive areas of human papillomavirus, vulvar and anal.  SURGEON:  Gretta Cool, MD  ANESTHESIA:  LMA.  DESCRIPTION OF PROCEDURE:  Under excellent anesthesia as above with the patient prepped in Allen stirrups with precautions for voiding, laser injury to the surrounding area or laser-induced fire, the patient was prepped and covered extensively.  She was then stained with acetic acid 5% over the entire vulvar area and perianal area so as to visualize all of the lesions.  There were lesions extending from her intracrural areas, mons pubis across her entire vulva to the anal skin and down the anal verge.  The lesions were outlined and then laser ablation used to eradicate the lesions including those in the anal verge after careful retraction of the anal canal.  Several lesions in the intracrural folds were excised by shave excision.  After complete vaporization of all visible lesions with several inspectors examining, the procedure was terminated without complications.  The patient returned to recovery room in excellent condition, with Silvadene applied to her areas of laser ablation and shave excision.          ______________________________ Gretta Cool, M.D.     CWL/MEDQ  D:  06/22/2010  T:  06/22/2010  Job:  [id number]  cc:   Rosalyn Gess. Norins, MD [address] [place] [address]  Electronically Signed by Beather Arbour M.D. on 07/20/2010 11:50:46 AM

## 2010-09-19 ENCOUNTER — Encounter: Payer: Self-pay | Admitting: Internal Medicine

## 2010-09-20 ENCOUNTER — Ambulatory Visit (INDEPENDENT_AMBULATORY_CARE_PROVIDER_SITE_OTHER): Payer: 59 | Admitting: Internal Medicine

## 2010-09-20 VITALS — BP 118/80 | HR 69 | Temp 97.5°F | Wt 256.0 lb

## 2010-09-20 DIAGNOSIS — K130 Diseases of lips: Secondary | ICD-10-CM

## 2010-09-20 DIAGNOSIS — R21 Rash and other nonspecific skin eruption: Secondary | ICD-10-CM

## 2010-09-20 MED ORDER — CLOTRIMAZOLE-BETAMETHASONE 1-0.05 % EX CREA: TOPICAL_CREAM | CUTANEOUS | Status: AC

## 2010-09-20 NOTE — Progress Notes (Signed)
  Subjective:    Patient ID: Suzanne Schaefer, female    DOB: [birth year], 50 y.o.   MRN: [id number]  HPI Suzanne Schaefer has a 6 month h/o rash at the base of the nose that is erythematous and pruritic. She has tried otc 1% cortisone and otc antifungal creams without success.. There have been no open lesion or vesicles, no pain. She has no h/o HSV  I have reviewed the patient's medical history in detail and updated the computerized patient record.    Review of Systems System review is negative for any constitutional, cardiac, pulmonary, GI or neuro symptoms or complaints     Objective:   Physical Exam Vitals noted. HEENT normal Respirations - normal Derm - macular erythematous rash at the upper lip and base of nose.        Assessment & Plan:  Rash - not sure of origin or type but may be fungal.  Plan - lotrisone bid.

## 2010-11-07 ENCOUNTER — Other Ambulatory Visit: Payer: Self-pay | Admitting: Radiology

## 2010-11-09 ENCOUNTER — Encounter: Payer: Self-pay | Admitting: Internal Medicine

## 2010-11-15 ENCOUNTER — Encounter: Payer: Self-pay | Admitting: Internal Medicine

## 2010-12-19 ENCOUNTER — Other Ambulatory Visit: Payer: Self-pay | Admitting: Internal Medicine

## 2011-01-03 ENCOUNTER — Encounter: Payer: Self-pay | Admitting: Internal Medicine

## 2011-01-17 ENCOUNTER — Other Ambulatory Visit: Payer: 59

## 2011-01-18 ENCOUNTER — Ambulatory Visit: Payer: 59

## 2011-01-18 DIAGNOSIS — Z0000 Encounter for general adult medical examination without abnormal findings: Secondary | ICD-10-CM

## 2011-01-18 LAB — CBC WITH DIFFERENTIAL/PLATELET
Basophils Relative: 0.5 % (ref 0.0–3.0)
Eosinophils Relative: 2.1 % (ref 0.0–5.0)
HCT: 39 % (ref 36.0–46.0)
Hemoglobin: 13.3 g/dL (ref 12.0–15.0)
Lymphocytes Relative: 30.5 % (ref 12.0–46.0)
Lymphs Abs: 1.6 10*3/uL (ref 0.7–4.0)
Monocytes Relative: 8.1 % (ref 3.0–12.0)
Neutro Abs: 3.1 10*3/uL (ref 1.4–7.7)
RBC: 4.51 Mil/uL (ref 3.87–5.11)
RDW: 14.1 % (ref 11.5–14.6)
WBC: 5.2 10*3/uL (ref 4.5–10.5)

## 2011-01-18 LAB — URINALYSIS
Bilirubin Urine: NEGATIVE
Ketones, ur: NEGATIVE
Leukocytes, UA: NEGATIVE
Specific Gravity, Urine: >=1.03 (ref 1.000–1.030)
Total Protein, Urine: NEGATIVE
Urine Glucose: NEGATIVE
pH: 5.5 (ref 5.0–8.0)

## 2011-01-18 LAB — LIPID PANEL
Cholesterol: 195 mg/dL (ref 0–200)
LDL Cholesterol: 108 mg/dL — ABNORMAL HIGH (ref 0–99)
Total CHOL/HDL Ratio: 3
VLDL: 17.2 mg/dL (ref 0.0–40.0)

## 2011-01-18 LAB — HEPATIC FUNCTION PANEL
Albumin: 4.1 g/dL (ref 3.5–5.2)
Alkaline Phosphatase: 98 U/L (ref 39–117)
Bilirubin, Direct: 0.1 mg/dL (ref 0.0–0.3)
Total Protein: 7.6 g/dL (ref 6.0–8.3)

## 2011-01-18 LAB — BASIC METABOLIC PANEL
BUN: 17 mg/dL (ref 6–23)
CO2: 29 mEq/L (ref 19–32)
Chloride: 104 mEq/L (ref 96–112)
Creatinine, Ser: 0.7 mg/dL (ref 0.4–1.2)
Potassium: 4.2 mEq/L (ref 3.5–5.1)

## 2011-01-22 ENCOUNTER — Other Ambulatory Visit: Payer: Self-pay | Admitting: Gynecology

## 2011-01-22 ENCOUNTER — Ambulatory Visit (INDEPENDENT_AMBULATORY_CARE_PROVIDER_SITE_OTHER): Payer: 59 | Admitting: Internal Medicine

## 2011-01-22 DIAGNOSIS — D509 Iron deficiency anemia, unspecified: Secondary | ICD-10-CM

## 2011-01-22 DIAGNOSIS — J309 Allergic rhinitis, unspecified: Secondary | ICD-10-CM

## 2011-01-22 DIAGNOSIS — G4733 Obstructive sleep apnea (adult) (pediatric): Secondary | ICD-10-CM

## 2011-01-22 DIAGNOSIS — G2581 Restless legs syndrome: Secondary | ICD-10-CM

## 2011-01-22 DIAGNOSIS — Z0000 Encounter for general adult medical examination without abnormal findings: Secondary | ICD-10-CM

## 2011-01-22 NOTE — Progress Notes (Signed)
Subjective:    Patient ID: Suzanne Schaefer, female    DOB: 18-Oct-1960, 50 y.o.   MRN: [id number]  HPI Suzanne Schaefer presents for routine medical exam will be 51. She feels well and has had no major illness, injury or surgery, but did have a breast biopsy in the interval since her last visit.   She saw Dr. Nicholas Lose this AM. Everything was OK. She has had mammogram - initially abnormal including follow-up U/s leading to guided needle biopsy that was normal. Reviewed chart - located note from Dr. Arlyce Dice in '07 referring to colonoscopy - pt's recollection is that this was a normal study.   Past Medical History  Diagnosis Date  . Restless leg syndrome   . Obstructive sleep apnea   . Tonsillitis   . Esophagitis   . Allergic rhinitis   . History of sinusitis   . Obesity    Past Surgical History  Procedure Date  . Cervical biopsy   . Laparoscopic cholecystectomy     '92  . Bilat salpingectomy   . Cesarean section    Family History  Problem Relation Age of Onset  . Asthma Mother   . COPD Mother   . Hypertension Father   . Obesity Father   . Glaucoma Father    History   Social History  . Marital Status: Divorced    Spouse Name: N/A    Number of Children: N/A  . Years of Education: N/A   Occupational History  . Not on file.   Social History Main Topics  . Smoking status: Not on file  . Smokeless tobacco: Not on file  . Alcohol Use:   . Drug Use:   . Sexually Active:    Other Topics Concern  . Not on file   Social History Narrative   HSGMarried 1981- divorce '892 daughters '86, 88Work Medical Records Le[employer]auerLives in own home daughters at home    Current Outpatient Prescriptions on File Prior to Visit  Medication Sig Dispense Refill  . Calcium Polycarbophil (FIBERCON PO) Take by mouth.        . cetirizine (ZYRTEC) 10 MG tablet Take 10 mg by mouth daily.        . clotrimazole-betamethasone (LOTRISONE) cream Apply to affected area 2 times daily  15 g  1  .  dextromethorphan-guaiFENesin (MUCINEX DM) 30-600 MG per 12 hr tablet Take 1 tablet by mouth every 12 (twelve) hours.        . Flaxseed, Linseed, (FLAXSEED OIL) 1000 MG CAPS Take 3 capsules by mouth 2 (two) times daily.        . Glucosamine-Chondroitin 1500-1200 MG/30ML LIQD Take 1 tablet by mouth 3 (three) times daily.        Marland Kitchen ibuprofen (ADVIL,MOTRIN) 200 MG tablet Take 200 mg by mouth every 6 (six) hours as needed.        . naproxen sodium (ANAPROX) 220 MG tablet Take 220 mg by mouth at bedtime as needed.        . pantoprazole (PROTONIX) 40 MG tablet TAKE 1 TABLET EVERY MORNING  90 tablet  1  . promethazine-codeine (PHENERGAN WITH CODEINE) 6.25-10 MG/5ML syrup Take 5 mLs by mouth every 4 (four) hours as needed.        Marland Kitchen rOPINIRole (REQUIP) 0.25 MG tablet Take 0.25 mg by mouth 3 (three) times daily.        . sertraline (ZOLOFT) 50 MG tablet Take 50 mg by mouth daily.  Review of Systems Constitutional:  Negative for fever, chills, activity change and unexpected weight change.  HEENT:  Negative for hearing loss, ear pain, congestion, neck stiffness and postnasal drip. Negative for sore throat or swallowing problems. Negative for dental complaints.   Eyes: Negative for vision loss or change in visual acuity.  Respiratory: Negative for chest tightness and wheezing. Negative for DOE.   Cardiovascular: Negative for chest pain or palpitations. No decreased exercise tolerance Gastrointestinal: No change in bowel habit. No bloating or gas. No reflux or indigestion Genitourinary: Negative for urgency, frequency, flank pain and difficulty urinating.  Musculoskeletal: Negative for myalgias, back pain, arthralgias and gait problem. Left knee pain with click - floating patella Neurological: Negative for dizziness, tremors, weakness and headaches.  Hematological: Negative for adenopathy.  Psychiatric/Behavioral: Negative for behavioral problems and dysphoric mood.       Objective:   Physical  Exam Vitals reviewed - normal except for BMI General - overweight white woman in no distress HEENT - [place]/AT, C&S clear with PERRLA, fundiscopic exam w/o abnormality, oropharynx w/o lesions on buccal membrane or palate, tonsils remain, throat is clear. Neck - supple w/o thyromegaly Nodes - negative submandibular, cervical or supraclavicular Chest - w/o deformity Breast - deferred to gyn and mammographer Cor - 2+ radial pulse, RRR w/o murmur, gallop Abdomen - obese, soft, BS+ Pelvic/rectal - deferred to gyn Ext- w/o C/C/E, no deformity is noted, normal ROM small, medium and large joints. Neuro - A&O x 3, CN II-XII normal, MS normal, gait and balance normal MMSE: 1. Day,date,year - ok 2. Content: president- ok   Gov. -   Current events - ok 3. Number repitition: 5 fwd -    5 rev -         World reversed - : not done 4. 3 word recall - not done 5. Serial 7's - poor/with difficulty   , nickles in $1.25-  no      Change making - ok 6. Naming objects - ok          4 legged creatures-not done 7. Parables:  [place] -  [place] but ok     Rolling stone - terse but ok 8. Judgement:  Letter          Fire: not done 9. Clock face exercise-not done Derm - clear  Lab Results  Component Value Date   WBC 5.2 01/18/2011   HGB 13.3 01/18/2011   HCT 39.0 01/18/2011   PLT 211.0 01/18/2011   GLUCOSE 107* 01/18/2011   CHOL 195 01/18/2011   TRIG 86.0 01/18/2011   HDL 70.00 01/18/2011   LDLDIRECT 105.1 01/09/2010   LDLCALC 108* 01/18/2011   ALT 21 01/18/2011   AST 51* 01/18/2011   NA 140 01/18/2011   K 4.2 01/18/2011   CL 104 01/18/2011   CREATININE 0.7 01/18/2011   BUN 17 01/18/2011   CO2 29 01/18/2011   TSH 1.40 01/18/2011   HGBA1C 5.9 01/10/2009           Assessment & Plan:

## 2011-01-23 ENCOUNTER — Encounter: Payer: Self-pay | Admitting: Internal Medicine

## 2011-01-23 ENCOUNTER — Other Ambulatory Visit: Payer: Self-pay | Admitting: Internal Medicine

## 2011-01-23 DIAGNOSIS — Z0000 Encounter for general adult medical examination without abnormal findings: Secondary | ICD-10-CM | POA: Insufficient documentation

## 2011-01-23 NOTE — Assessment & Plan Note (Addendum)
She reports that she continues with Requip, along with zoloft, and has good results.  Plan - will continue present meds

## 2011-01-23 NOTE — Assessment & Plan Note (Signed)
Stable - has been a bad year.

## 2011-01-23 NOTE — Assessment & Plan Note (Signed)
Interval notable for abnl Mammogram leading to needle biopsy that was benign! History is otherwise unremarkable. Limited physical exam is normal although her performance on MMSE had some deficits especially in calculations. She is current with gynecology. By inference she is current with colorectal cancer screening. Immunizations - flu and tetanus are up to date.   IN summary - a very nice woman who is medically stable. There is not evidence of cognitive impairment of concern. She does continue to work on her weight - a life long project. She will return as needed.

## 2011-01-23 NOTE — Assessment & Plan Note (Signed)
Could not locate sleep study - done in '08(?). Last note from Dr. Craige Cotta '08- she was not tolerating CPAP at that time.  Plan - will check with patient re: continuation of CPAP

## 2011-01-23 NOTE — Assessment & Plan Note (Signed)
Lab Results  Component Value Date   HGB 13.3 01/18/2011   Problem resolved.

## 2011-01-24 LAB — LUTEINIZING HORMONE: LH: 24.34 m[IU]/mL

## 2011-01-24 LAB — FOLLICLE STIMULATING HORMONE: FSH: 54 m[IU]/mL

## 2011-01-30 ENCOUNTER — Encounter: Payer: Self-pay | Admitting: Internal Medicine

## 2011-04-23 ENCOUNTER — Telehealth: Payer: Self-pay | Admitting: Internal Medicine

## 2011-04-23 MED ORDER — ROPINIROLE HCL 0.25 MG PO TABS: 0.7500 mg | ORAL_TABLET | Freq: Every day | ORAL | Status: DC

## 2011-04-23 MED ORDER — PANTOPRAZOLE SODIUM 40 MG PO TBEC: 40.0000 mg | DELAYED_RELEASE_TABLET | Freq: Every day | ORAL | Status: DC

## 2011-04-23 NOTE — Telephone Encounter (Signed)
Rx's refilled, pt informed.  

## 2011-04-23 NOTE — Telephone Encounter (Signed)
Suzanne Schaefer needs generic protonix and requip generic written for 90 days.  She is changing her pharmacy to [facility].

## 2011-04-23 NOTE — Telephone Encounter (Signed)
Ok for refills - forwarded to triage

## 2011-04-25 ENCOUNTER — Other Ambulatory Visit: Payer: Self-pay

## 2011-04-26 ENCOUNTER — Other Ambulatory Visit: Payer: Self-pay

## 2011-04-26 MED ORDER — ROPINIROLE HCL 0.25 MG PO TABS: 0.7500 mg | ORAL_TABLET | Freq: Every day | ORAL | Status: DC

## 2011-04-26 MED ORDER — PANTOPRAZOLE SODIUM 40 MG PO TBEC: 40.0000 mg | DELAYED_RELEASE_TABLET | Freq: Every day | ORAL | Status: DC

## 2011-04-29 ENCOUNTER — Other Ambulatory Visit: Payer: Self-pay | Admitting: *Deleted

## 2011-04-29 MED ORDER — ROPINIROLE HCL 0.25 MG PO TABS: 0.7500 mg | ORAL_TABLET | Freq: Every day | ORAL | Status: DC

## 2011-04-29 MED ORDER — PANTOPRAZOLE SODIUM 40 MG PO TBEC: 40.0000 mg | DELAYED_RELEASE_TABLET | Freq: Every day | ORAL | Status: DC

## 2011-06-19 ENCOUNTER — Telehealth: Payer: Self-pay | Admitting: *Deleted

## 2011-06-19 NOTE — Telephone Encounter (Signed)
Patient notified of need to reschedule her mammogram for repeat based on their results of 11/07/2010. Patient states she will do this in August when her yearly mammogram is due since the biopsy was negative. States she is not sure if insurance would cover her yearly test if she had another done now.

## 2011-09-03 ENCOUNTER — Encounter: Payer: Self-pay | Admitting: Internal Medicine

## 2011-09-03 ENCOUNTER — Ambulatory Visit (INDEPENDENT_AMBULATORY_CARE_PROVIDER_SITE_OTHER): Payer: 59 | Admitting: Internal Medicine

## 2011-09-03 ENCOUNTER — Ambulatory Visit (INDEPENDENT_AMBULATORY_CARE_PROVIDER_SITE_OTHER)
Admission: RE | Admit: 2011-09-03 | Discharge: 2011-09-03 | Disposition: A | Discharge status: Home / Self Care | Payer: 59 | Source: Ambulatory Visit | Attending: Internal Medicine | Admitting: Internal Medicine

## 2011-09-03 VITALS — BP 100/80 | HR 96 | Temp 98.7°F | Resp 16 | Wt 259.0 lb

## 2011-09-03 DIAGNOSIS — R202 Paresthesia of skin: Secondary | ICD-10-CM

## 2011-09-03 DIAGNOSIS — R209 Unspecified disturbances of skin sensation: Secondary | ICD-10-CM

## 2011-09-03 MED ORDER — ESTRADIOL 0.05 MG/24HR TD PTTW: 1.0000 | MEDICATED_PATCH | TRANSDERMAL | Status: DC

## 2011-09-04 NOTE — Progress Notes (Signed)
Subjective:    Patient ID: Suzanne Schaefer, female    DOB: Sep 05, 1960, 51 y.o.   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 She recall lifing a bucket of water over hear head with arms extended and felt a burning, pull in the upper back midline. Subsequently she has notice intermittent pain in the right UE from shoulder to hand with mild paresthesia left hand. She denies any neck pain or previous neck injury. She has no limitation in activity.  PMH, FamHx and SocHx reviewed for any changes and relevance.  Current Outpatient Prescriptions on File Prior to Visit  Medication Sig Dispense Refill  . Calcium Polycarbophil (FIBERCON PO) Take by mouth.        . Calcium-Magnesium-Vitamin D (CITRACAL CALCIUM+D PO) Take 2 capsules by mouth.      . cetirizine (ZYRTEC) 10 MG tablet Take 10 mg by mouth daily.        Marland Kitchen dextromethorphan-guaiFENesin (MUCINEX DM) 30-600 MG per 12 hr tablet Take 1 tablet by mouth every 12 (twelve) hours.        . Flaxseed, Linseed, (FLAXSEED OIL) 1000 MG CAPS Take 3 capsules by mouth 2 (two) times daily.        . Glucosamine-Chondroitin 1500-1200 MG/30ML LIQD Take 1 tablet by mouth 3 (three) times daily.        Marland Kitchen ibuprofen (ADVIL,MOTRIN) 200 MG tablet Take 200 mg by mouth every 6 (six) hours as needed.        . naproxen sodium (ANAPROX) 220 MG tablet Take 220 mg by mouth at bedtime as needed.        . pantoprazole (PROTONIX) 40 MG tablet Take 1 tablet (40 mg total) by mouth daily.  90 tablet  1  . promethazine-codeine (PHENERGAN WITH CODEINE) 6.25-10 MG/5ML syrup Take 5 mLs by mouth every 4 (four) hours as needed.        Marland Kitchen rOPINIRole (REQUIP) 0.25 MG tablet Take 3 tablets (0.75 mg total) by mouth at bedtime.  270 tablet  1  . clotrimazole-betamethasone (LOTRISONE) cream Apply to affected area 2 times daily  15 g  1  . estradiol (MINIVELLE) 0.05 MG/24HR Place 1 patch (0.05 mg total) onto the skin once a week.  8 patch  12  . ferrous sulfate 325 (65  FE) MG tablet Take 325 mg by mouth 2 (two) times daily.        . sertraline (ZOLOFT) 50 MG tablet Take 50 mg by mouth daily.            Review of Systems System review is negative for any constitutional, cardiac, pulmonary, GI or neuro symptoms or complaints other than as described in the HPI.     Objective:   Physical Exam Filed Vitals:   09/03/11 1617  BP: 100/80  Pulse: 96  Temp: 98.7 F (37.1 C)  Resp: 16   Wt Readings from Last 3 Encounters:  09/03/11 259 lb (117.482 kg)  01/22/11 235 lb (106.595 kg)  09/20/10 256 lb (116.121 kg)   Gen'l- overweight woman in no distress Neuro - A&O x 3. Full ROM upper extremities, normal grip, finger, distal and proximal Left UE strength. Normal DTRs at radial and biceps tendon. Normal sensation to light tough, pin prick and deep vibration left UE.  C-Spine Series: CERVICAL SPINE - COMPLETE 4+ VIEW  Comparison: CT neck of 12/05/2009  Findings: There may be some progression of degenerative disc  disease at C4-5, C5-6, and C6-7 levels with  loss of disc space and  spurring. There is cervical vertebrae are straightened in  alignment. No prevertebral soft tissue swelling is seen. On  oblique views there is moderate foraminal narrowing bilaterally at  C5-6 and C6-7 levels. The odontoid process is intact. The lung  apices are clear.  IMPRESSION:  Straightened alignment with degenerative disc disease particularly  at C5-6 and C6-7 with foraminal narrowing at these levels.       Assessment & Plan:  Paresthesia Left UE - no radicular findings on exam. X-ray reveals DDD C5-6, C6-7 with moderate foraminal narrowing at these levels.   Plan In the absence of more pronounced findings no indication for MRI or for any intervention.  ROM exercise for the neck will be helpful  For worsening symptoms: increased severity of pain, denseness of paresthesia, weakness will refer to Neurosurgery.

## 2011-09-13 ENCOUNTER — Encounter: Payer: Self-pay | Admitting: Internal Medicine

## 2011-11-28 ENCOUNTER — Encounter: Payer: Self-pay | Admitting: Internal Medicine

## 2012-01-23 ENCOUNTER — Encounter: Payer: Self-pay | Admitting: Internal Medicine

## 2012-01-23 ENCOUNTER — Ambulatory Visit (INDEPENDENT_AMBULATORY_CARE_PROVIDER_SITE_OTHER): Payer: 59 | Admitting: Internal Medicine

## 2012-01-23 ENCOUNTER — Ambulatory Visit (INDEPENDENT_AMBULATORY_CARE_PROVIDER_SITE_OTHER)
Admission: RE | Admit: 2012-01-23 | Discharge: 2012-01-23 | Disposition: A | Discharge status: Home / Self Care | Payer: 59 | Source: Ambulatory Visit | Attending: Internal Medicine | Admitting: Internal Medicine

## 2012-01-23 ENCOUNTER — Other Ambulatory Visit (INDEPENDENT_AMBULATORY_CARE_PROVIDER_SITE_OTHER): Payer: 59

## 2012-01-23 VITALS — BP 122/78 | HR 81 | Temp 97.1°F | Resp 10 | Ht 63.0 in | Wt 277.2 lb

## 2012-01-23 DIAGNOSIS — R238 Other skin changes: Secondary | ICD-10-CM

## 2012-01-23 DIAGNOSIS — D509 Iron deficiency anemia, unspecified: Secondary | ICD-10-CM

## 2012-01-23 DIAGNOSIS — R209 Unspecified disturbances of skin sensation: Secondary | ICD-10-CM

## 2012-01-23 DIAGNOSIS — M549 Dorsalgia, unspecified: Secondary | ICD-10-CM

## 2012-01-23 DIAGNOSIS — E669 Obesity, unspecified: Secondary | ICD-10-CM

## 2012-01-23 DIAGNOSIS — M79672 Pain in left foot: Secondary | ICD-10-CM

## 2012-01-23 DIAGNOSIS — M79609 Pain in unspecified limb: Secondary | ICD-10-CM

## 2012-01-23 DIAGNOSIS — G2581 Restless legs syndrome: Secondary | ICD-10-CM

## 2012-01-23 DIAGNOSIS — Z0000 Encounter for general adult medical examination without abnormal findings: Secondary | ICD-10-CM

## 2012-01-23 DIAGNOSIS — G4733 Obstructive sleep apnea (adult) (pediatric): Secondary | ICD-10-CM

## 2012-01-23 LAB — CBC WITH DIFFERENTIAL/PLATELET
Basophils Absolute: 0 10*3/uL (ref 0.0–0.1)
Eosinophils Absolute: 0.2 10*3/uL (ref 0.0–0.7)
HCT: 38 % (ref 36.0–46.0)
Lymphs Abs: 2.1 10*3/uL (ref 0.7–4.0)
MCHC: 33.6 g/dL (ref 30.0–36.0)
MCV: 82.1 fl (ref 78.0–100.0)
Monocytes Absolute: 0.5 10*3/uL (ref 0.1–1.0)
Neutrophils Relative %: 58.2 % (ref 43.0–77.0)
Platelets: 303 10*3/uL (ref 150.0–400.0)
RDW: 14.6 % (ref 11.5–14.6)
WBC: 6.7 10*3/uL (ref 4.5–10.5)

## 2012-01-23 LAB — COMPREHENSIVE METABOLIC PANEL
ALT: 17 U/L (ref 0–35)
AST: 47 U/L — ABNORMAL HIGH (ref 0–37)
Albumin: 4 g/dL (ref 3.5–5.2)
CO2: 28 mEq/L (ref 19–32)
Calcium: 9.2 mg/dL (ref 8.4–10.5)
Chloride: 103 mEq/L (ref 96–112)
Creatinine, Ser: 0.8 mg/dL (ref 0.4–1.2)
GFR: 85.35 mL/min (ref >60.00)
Potassium: 4 mEq/L (ref 3.5–5.1)
Total Protein: 7.8 g/dL (ref 6.0–8.3)

## 2012-01-23 LAB — IBC PANEL
Iron: 66 ug/dL (ref 42–145)
Saturation Ratios: 16.5 % — ABNORMAL LOW (ref 20.0–50.0)
Transferrin: 285.1 mg/dL (ref 212.0–360.0)

## 2012-01-23 NOTE — Patient Instructions (Addendum)
1. Obesity is your number 1 health issue. Short term diets, bariatic surgery is not viable now. It is a question of getting to an understanding of the underlying drivers for your eating behavior. A psychologic approach will be the most productive over the long term.  2. Cold hand - good circulation on exam. Thyroid function has been normal but we will re-check  3. Right buttock/hip pain - sounds muscular skeletal.  First choice - go to YouTube.com and search out buttock pain and stretch, hip pain and and stretch or piriformis syndrome and help  4. Abdominal discomfort with movement - ???? Try a liquid antacid to relieve the pain.  Overall your are metabolically healthy!  Have a good holiday.

## 2012-01-23 NOTE — Progress Notes (Signed)
Subjective:    Patient ID: Suzanne Schaefer, female    DOB: 1960/11/16, 51 y.o.   MRN: [id number]  HPI Presents for wellness exam. She will be seeing her gyn, Dr. Nicholas Lose, Dec 20th; had teeth cleaned  Dec 18th; ophthal exam Dec 19th (Dr. Ronney Asters).  Reviewed chart in regard to sleep issues: she has had counseling in the past which did not help. She has had a sleep study Dec '06 - RDI 6. Trial of CPAP with titration study in July '07. She no longer uses CPAP - difficult to use. No endocrine issue.   She does recognize the importance of addressing weight as primary health risk, especially in regard to MSK issues.  Issues: 1- cold hands, 2 - right buttock pain worse at night,  3 - foot pain left, tingles across forefoot and pain at foot and ankle, 4 - burning pain in the epigastric area when she turns  Past Medical History  Diagnosis Date  . Restless leg syndrome   . Obstructive sleep apnea   . Tonsillitis   . Esophagitis   . Allergic rhinitis   . History of sinusitis   . Obesity    Past Surgical History  Procedure Date  . Cervical biopsy   . Laparoscopic cholecystectomy     '92  . Bilat salpingectomy   . Cesarean section    Family History  Problem Relation Age of Onset  . Asthma Mother   . COPD Mother   . Hypertension Father   . Obesity Father   . Glaucoma Father    History   Social History  . Marital Status: Divorced    Spouse Name: N/A    Number of Children: N/A  . Years of Education: N/A   Occupational History  . Not on file.   Social History Main Topics  . Smoking status: Never Smoker   . Smokeless tobacco: Never Used  . Alcohol Use: Yes  . Drug Use: No  . Sexually Active: Yes -- Female partner(s)   Other Topics Concern  . Not on file   Social History Narrative   HSG. Married 31- divorce '89. 2 daughters '86, 88. 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 Calcium Polycarbophil (FIBERCON PO) Take by mouth.        . Calcium-Magnesium-Vitamin D (CITRACAL CALCIUM+D PO) Take 2 capsules by mouth.      . cholecalciferol (VITAMIN D) 1000 UNITS tablet Take 2,000 Units by mouth daily.      Marland Kitchen estradiol (MINIVELLE) 0.05 MG/24HR Place 1 patch (0.05 mg total) onto the skin once a week.  8 patch  12  . Flaxseed, Linseed, (FLAXSEED OIL) 1000 MG CAPS Take 3 capsules by mouth 2 (two) times daily.        . Glucosamine-Chondroitin 1500-1200 MG/30ML LIQD Take 1 tablet by mouth 3 (three) times daily.        Marland Kitchen ibuprofen (ADVIL,MOTRIN) 200 MG tablet Take 200 mg by mouth every 6 (six) hours as needed.        . naproxen sodium (ANAPROX) 220 MG tablet Take 220 mg by mouth at bedtime as needed.        . pantoprazole (PROTONIX) 40 MG tablet Take 1 tablet (40 mg total) by mouth daily.  90 tablet  1  . rOPINIRole (REQUIP) 0.25 MG tablet Take 3 tablets (0.75 mg total) by mouth  at bedtime.  270 tablet  1  . promethazine-codeine (PHENERGAN WITH CODEINE) 6.25-10 MG/5ML syrup Take 5 mLs by mouth every 4 (four) hours as needed.            Review of Systems System review is negative for any constitutional, cardiac, pulmonary, GI or neuro symptoms or complaints other than as described in the HPI.     Objective:   Physical Exam Filed Vitals:   01/23/12 0853  BP: 122/78  Pulse: 81  Temp: 97.1 F (36.2 C)  Resp: 10   Wt Readings from Last 3 Encounters:  01/23/12 277 lb 3.2 oz (125.737 kg)  09/03/11 259 lb (117.482 kg)  01/22/11 235 lb (106.595 kg)   Gen'l- obese white woman in no distress HEENT - [place]/AT, EACs/TMs normal, normal dentition, no buccal or palatal lesions, posterior pharynx is clear Neck - supple, no thyromegaly Nodes - negative Chest - no deformity. Lungs - CTAP Breast  - deferred to gyn Cor 2+ radial pulse, RRR, good capillary refill Abdomen no guarding or rebound Pelvic/rectal - deferred to gyn Ext - no deformity noted Neuro - A&O  x 3, CN II-XII grossly normal Derm - no lesion on face,neck, UE  Lab Results  Component Value Date   WBC 6.7 01/23/2012   HGB 12.8 01/23/2012   HCT 38.0 01/23/2012   PLT 303.0 01/23/2012   GLUCOSE 111* 01/23/2012   CHOL 195 01/18/2011   TRIG 86.0 01/18/2011   HDL 70.00 01/18/2011   LDLDIRECT 105.1 01/09/2010   LDLCALC 108* 01/18/2011   ALT 17 01/23/2012   AST 47* 01/23/2012   NA 137 01/23/2012   K 4.0 01/23/2012   CL 103 01/23/2012   CREATININE 0.8 01/23/2012   BUN 13 01/23/2012   CO2 28 01/23/2012   TSH 1.15 01/23/2012   HGBA1C 5.9 01/10/2009          Assessment & Plan:  1. Cold hands - normal circulation with good capillary refill. Thyroid function has been normal in the past.  Plan - repeat TSH  2. Buttock pain - OA hip vs muscle related pain/piriformis syndrome. Exam is unrevealing  Plan Direct to [email or web address].[email or web address] for instruction videos on stretch and flex to help with buttock pain/ piriformis syndrome.  3. Pain across the foot - normal exam. May be arthritic changes. No rash, good circulation.  Plan -  X-ray left foot and ankle  Addendum:  X-ray left foot: LEFT FOOT - COMPLETE 3+ VIEW  Comparison: None.  Findings: Three views of the left foot submitted. No acute  fracture or subluxation. Plantar spur of the calcaneus is noted.  IMPRESSION:  No acute fracture or subluxation. Plantar spur of the calcaneus.  X-ray left ankle: LEFT ANKLE COMPLETE - 3+ VIEW  Comparison: None.  Findings: Three views of the left ankle submitted. No acute  fracture or subluxation. Ankle mortise is preserved. Soft tissue  swelling noted adjacent to lateral malleolus. Plantar spurring of  the calcaneus.  IMPRESSION:  No acute fracture or subluxation. Soft tissue swelling adjacent to  lateral malleolus. Plantar spurring of the calcaneus.   4. Abdominal pain - normal exam.

## 2012-01-24 ENCOUNTER — Other Ambulatory Visit: Payer: Self-pay | Admitting: Gynecology

## 2012-01-24 ENCOUNTER — Other Ambulatory Visit: Payer: Self-pay | Admitting: *Deleted

## 2012-01-24 MED ORDER — PANTOPRAZOLE SODIUM 40 MG PO TBEC: 40.0000 mg | DELAYED_RELEASE_TABLET | Freq: Every day | ORAL | Status: DC

## 2012-01-24 MED ORDER — ROPINIROLE HCL 0.25 MG PO TABS: 0.7500 mg | ORAL_TABLET | Freq: Every day | ORAL | Status: DC

## 2012-01-26 NOTE — Assessment & Plan Note (Signed)
Adequate control with Requip

## 2012-01-26 NOTE — Assessment & Plan Note (Signed)
Patient w/o symptoms. No indication to repeat sleep study. She did not do well with CPAP  Plan Weight loss

## 2012-01-26 NOTE — Assessment & Plan Note (Signed)
Interval medical history is unremarkable. Limited physical exam normal except for weight. She is current with colorectal cancer screening and mammography. She has an appointment for Gyn exam. Lab results are in normal range. 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 She will return as needed.

## 2012-01-26 NOTE — Assessment & Plan Note (Signed)
She has a clear understanding of the health risks of her obesity. Discussed options: she does not think she will do well with bariatric surgery due to her psychological issues centered on eating/comfort food. She has treied counseling in the past and this may be an approach to revisit. Discouraged her from advertised weight loss products, discouraged her from consideration of diets.  Plan Weight management: smart food choices, portion size control, regular exercise, e.g. Walking daily

## 2012-06-08 ENCOUNTER — Ambulatory Visit (INDEPENDENT_AMBULATORY_CARE_PROVIDER_SITE_OTHER): Payer: 59 | Admitting: Internal Medicine

## 2012-06-08 ENCOUNTER — Encounter: Payer: Self-pay | Admitting: Internal Medicine

## 2012-06-08 ENCOUNTER — Ambulatory Visit (INDEPENDENT_AMBULATORY_CARE_PROVIDER_SITE_OTHER)
Admission: RE | Admit: 2012-06-08 | Discharge: 2012-06-08 | Disposition: A | Discharge status: Home / Self Care | Payer: 59 | Source: Ambulatory Visit | Attending: Internal Medicine | Admitting: Internal Medicine

## 2012-06-08 VITALS — BP 122/78 | HR 88 | Temp 97.7°F | Resp 16

## 2012-06-08 DIAGNOSIS — M549 Dorsalgia, unspecified: Secondary | ICD-10-CM

## 2012-06-08 DIAGNOSIS — M5126 Other intervertebral disc displacement, lumbar region: Secondary | ICD-10-CM

## 2012-06-08 MED ORDER — IBUPROFEN 600 MG PO TABS: 600.0000 mg | ORAL_TABLET | Freq: Three times a day (TID) | ORAL | Status: DC | PRN

## 2012-06-08 MED ORDER — METHOCARBAMOL 750 MG PO TABS: 750.0000 mg | ORAL_TABLET | Freq: Four times a day (QID) | ORAL | Status: DC

## 2012-06-08 MED ORDER — METHYLPREDNISOLONE 4 MG PO KIT: PACK | ORAL | Status: DC

## 2012-06-08 NOTE — Patient Instructions (Signed)
Back Pain, Adult Low back pain is very common. About 1 in 5 people have back pain.The cause of low back pain is rarely dangerous. The pain often gets better over time.About half of people with a sudden onset of back pain feel better in just 2 weeks. About 8 in 10 people feel better by 6 weeks.  CAUSES Some common causes of back pain include:  Strain of the muscles or ligaments supporting the spine.  Wear and tear (degeneration) of the spinal discs.  Arthritis.  Direct injury to the back. DIAGNOSIS Most of the time, the direct cause of low back pain is not known.However, back pain can be treated effectively even when the exact cause of the pain is unknown.Answering your caregiver's questions about your overall health and symptoms is one of the most accurate ways to make sure the cause of your pain is not dangerous. If your caregiver needs more information, he or she may order lab work or imaging tests (X-rays or MRIs).However, even if imaging tests show changes in your back, this usually does not require surgery. HOME CARE INSTRUCTIONS For many people, back pain returns.Since low back pain is rarely dangerous, it is often a condition that people can learn to manageon their own.   Remain active. It is stressful on the back to sit or stand in one place. Do not sit, drive, or stand in one place for more than 30 minutes at a time. Take short walks on level surfaces as soon as pain allows.Try to increase the length of time you walk each day.  Do not stay in bed.Resting more than 1 or 2 days can delay your recovery.  Do not avoid exercise or work.Your body is made to move.It is not dangerous to be active, even though your back may hurt.Your back will likely heal faster if you return to being active before your pain is gone.  Pay attention to your body when you bend and lift. Many people have less discomfortwhen lifting if they bend their knees, keep the load close to their bodies,and  avoid twisting. Often, the most comfortable positions are those that put less stress on your recovering back.  Find a comfortable position to sleep. Use a firm mattress and lie on your side with your knees slightly bent. If you lie on your back, put a pillow under your knees.  Only take over-the-counter or prescription medicines as directed by your caregiver. Over-the-counter medicines to reduce pain and inflammation are often the most helpful.Your caregiver may prescribe muscle relaxant drugs.These medicines help dull your pain so you can more quickly return to your normal activities and healthy exercise.  Put ice on the injured area.  Put ice in a plastic bag.  Place a towel between your skin and the bag.  Leave the ice on for 15 to 20 minutes, 3 to 4 times a day for the first 2 to 3 days. After that, ice and heat may be alternated to reduce pain and spasms.  Ask your caregiver about trying back exercises and gentle massage. This may be of some benefit.  Avoid feeling anxious or stressed.Stress increases muscle tension and can worsen back pain.It is important to recognize when you are anxious or stressed and learn ways to manage it.Exercise is a great option. SEEK MEDICAL CARE IF:  You have pain that is not relieved with rest or medicine.  You have pain that does not improve in 1 week.  You have new symptoms.  You are generally   not feeling well. SEEK IMMEDIATE MEDICAL CARE IF:   You have pain that radiates from your back into your legs.  You develop new bowel or bladder control problems.  You have unusual weakness or numbness in your arms or legs.  You develop nausea or vomiting.  You develop abdominal pain.  You feel faint. Document Released: 01/21/2005 Document Revised: 07/23/2011 Document Reviewed: 06/11/2010 Exit[employer] Patient Information 2013 [employer].  

## 2012-06-09 ENCOUNTER — Encounter: Payer: Self-pay | Admitting: Internal Medicine

## 2012-06-09 NOTE — Assessment & Plan Note (Signed)
I think she has a herniated disc with minimal myelopathy I will treat with a merdol dose pak and nsaids She is getting some relief with a muscle relaxer so will continue that

## 2012-06-09 NOTE — Progress Notes (Signed)
Subjective:    Patient ID: Suzanne Schaefer, female    DOB: January 20, 1961, 52 y.o.   MRN: [id number]  Back Pain This is a new problem. The current episode started in the past 7 days. The problem is unchanged. The pain is present in the lumbar spine. The quality of the pain is described as aching. The pain radiates to the left thigh. The pain is at a severity of 3/10. The pain is mild. The symptoms are aggravated by position and bending. Pertinent negatives include no abdominal pain, bladder incontinence, bowel incontinence, chest pain, dysuria, fever, headaches, leg pain, numbness, paresis, paresthesias, pelvic pain, perianal numbness, tingling, weakness or weight loss. Risk factors include recent trauma. She has tried muscle relaxant for the symptoms. The treatment provided mild relief.      Review of Systems  Constitutional: Negative.  Negative for fever and weight loss.  HENT: Negative.   Eyes: Negative.   Respiratory: Negative.   Cardiovascular: Negative.  Negative for chest pain, palpitations and leg swelling.  Gastrointestinal: Negative.  Negative for abdominal pain and bowel incontinence.  Endocrine: Negative.   Genitourinary: Negative.  Negative for bladder incontinence, dysuria and pelvic pain.  Musculoskeletal: Positive for back pain. Negative for myalgias, joint swelling, arthralgias and gait problem.  Skin: Negative.  Negative for color change, pallor, rash and wound.  Allergic/Immunologic: Negative.   Neurological: Negative.  Negative for tingling, weakness, numbness, headaches and paresthesias.  Hematological: Negative.  Negative for adenopathy. Does not bruise/bleed easily.  Psychiatric/Behavioral: Negative.        Objective:   Physical Exam  Vitals reviewed. Constitutional: She is oriented to person, place, and time. She appears well-developed and well-nourished. No distress.  HENT:  Head: Normocephalic and atraumatic.  Mouth/Throat: Oropharynx is clear and moist. No  oropharyngeal exudate.  Eyes: Conjunctivae are normal. Right eye exhibits no discharge. Left eye exhibits no discharge. No scleral icterus.  Neck: Normal range of motion. Neck supple. No JVD present. No tracheal deviation present. No thyromegaly present.  Cardiovascular: Normal rate, regular rhythm and intact distal pulses.  Exam reveals no gallop and no friction rub.   No murmur heard. Pulmonary/Chest: Effort normal and breath sounds normal. No stridor. No respiratory distress. She has no wheezes. She has no rales. She exhibits no tenderness.  Abdominal: Soft. Bowel sounds are normal. She exhibits no distension and no mass. There is no tenderness. There is no rebound and no guarding.  Musculoskeletal: Normal range of motion. She exhibits no edema and no tenderness.       Lumbar back: Normal. She exhibits normal range of motion, no tenderness, no bony tenderness, no swelling, no edema, no deformity, no laceration, no pain, no spasm and normal pulse.  Lymphadenopathy:    She has no cervical adenopathy.  Neurological: She is alert and oriented to person, place, and time. She has normal strength. She displays no atrophy, no tremor and normal reflexes. No cranial nerve deficit or sensory deficit. She exhibits normal muscle tone. She displays a negative Romberg sign. She displays no seizure activity. Coordination and gait normal.  Reflex Scores:      Tricep reflexes are 1+ on the right side and 1+ on the left side.      Bicep reflexes are 1+ on the right side and 1+ on the left side.      Brachioradialis reflexes are 1+ on the right side and 1+ on the left side.      Patellar reflexes are 1+ on the right side  and 1+ on the left side.      Achilles reflexes are 1+ on the right side and 1+ on the left side. + SLR in LLE - SLR in RLE  Skin: Skin is warm and dry. No rash noted. She is not diaphoretic. No erythema. No pallor.  Psychiatric: She has a normal mood and affect. Her behavior is normal. Judgment  and thought content normal.          Assessment & Plan:

## 2012-06-09 NOTE — Assessment & Plan Note (Signed)
In light of her recent injury I will check a plain film of her lower back

## 2012-07-06 ENCOUNTER — Encounter: Payer: Self-pay | Admitting: Internal Medicine

## 2012-07-06 ENCOUNTER — Ambulatory Visit (INDEPENDENT_AMBULATORY_CARE_PROVIDER_SITE_OTHER): Payer: 59 | Admitting: Internal Medicine

## 2012-07-06 VITALS — BP 122/80 | HR 90 | Temp 97.5°F | Wt 280.8 lb

## 2012-07-06 DIAGNOSIS — I872 Venous insufficiency (chronic) (peripheral): Secondary | ICD-10-CM | POA: Insufficient documentation

## 2012-07-06 MED ORDER — PANTOPRAZOLE SODIUM 40 MG PO TBEC: 40.0000 mg | DELAYED_RELEASE_TABLET | Freq: Every day | ORAL | Status: DC

## 2012-07-06 MED ORDER — ROPINIROLE HCL 0.25 MG PO TABS: 0.7500 mg | ORAL_TABLET | Freq: Every day | ORAL | Status: DC

## 2012-07-06 NOTE — Patient Instructions (Addendum)
Swelling in the feet and distal legs is most likely venous insufficiency in the absence of any evidence of heart failure or kidney failure.  Plan Elevation of the legs  Knee high or thigh high over the counter support hose.  Venous Stasis and Chronic Venous Insufficiency As people age, the veins located in their legs may weaken and stretch. When veins weaken and lose the ability to pump blood effectively, the condition is called chronic venous insufficiency (CVI) or venous stasis. Almost all veins return blood back to the heart. This happens by:  The force of the heart pumping fresh blood pushes blood back to the heart.  Blood flowing to the heart from the force of gravity. In the deep veins of the legs, blood has to fight gravity and flow upstream back to the heart. Here, the leg muscles contract to pump blood back toward the heart. Vein walls are elastic, and many veins have small valves that only allow blood to flow in one direction. When leg muscles contract, they push inward against the elastic vein walls. This squeezes blood upward, opens the valves, and moves blood toward the heart. When leg muscles relax, the vein wall also relaxes and the valves inside the vein close to prevent blood from flowing backward. This method of pumping blood out of the legs is called the venous pump. CAUSES  The venous pump works best while walking and leg muscles are contracting. But when a person sits or stands, blood pressure in leg veins can build. Deep veins are usually able to withstand short periods of inactivity, but long periods of inactivity (and increased pressure) can stretch, weaken, and damage vein walls. High blood pressure can also stretch and damage vein walls. The veins may no longer be able to pump blood back to the heart. Venous hypertension (high blood pressure inside veins) that lasts over time is a primary cause of CVI. CVI can also be caused by:   Deep vein thrombosis, a condition where a  thrombus (blood clot) blocks blood flow in a vein.  Phlebitis, an inflammation of a superficial vein that causes a blood clot to form. Other risk factors for CVI may include:   Heredity.  Obesity.  Pregnancy.  Sedentary lifestyle.  Smoking.  Jobs requiring long periods of standing or sitting in one place.  Age and gender:  Women in their 39's and 20's and men in their 20's are more prone to developing CVI. SYMPTOMS  Symptoms of CVI may include:   Varicose veins.  Ulceration or skin breakdown.  Lipodermatosclerosis, a condition that affects the skin just above the ankle, usually on the inside surface. Over time the skin becomes brown, smooth, tight and often painful. Those with this condition have a high risk of developing skin ulcers.  Reddened or discolored skin on the leg.  Swelling. DIAGNOSIS  Your caregiver can diagnose CVI after performing a careful medical history and physical examination. To confirm the diagnosis, the following tests may also be ordered:   Duplex ultrasound.  Plethysmography (tests blood flow).  Venograms (x-ray using a special dye). TREATMENT The goals of treatment for CVI are to restore a person to an active life and to minimize pain or disability. Typically, CVI does not pose a serious threat to life or limb, and with proper treatment most people with this condition can continue to lead active lives. In most cases, mild CVI can be treated on an outpatient basis with simple procedures. Treatment methods include:   Elastic compression  socks.  Sclerotherapy, a procedure involving an injection of a material that "dissolves" the damaged veins. Other veins in the network of blood vessels take over the function of the damaged veins.  Vein stripping (an older procedure less commonly used).  Laser Ablation surgery.  Valve repair. HOME CARE INSTRUCTIONS   Elastic compression socks must be worn every day. They can help with symptoms and lower the  chances of the problem getting worse, but they do not cure the problem.  Only take over-the-counter or prescription medicines for pain, discomfort, or fever as directed by your caregiver.  Your caregiver will review your other medications with you. SEEK MEDICAL CARE IF:   You are confused about how to take your medications.  There is redness, swelling, or increasing pain in the affected area.  There is a red streak or line that extends up or down from the affected area.  There is a breakdown or loss of skin in the affected area, even if the breakdown is small.  You develop an unexplained oral temperature above 102 F (38.9 C).  There is an injury to the affected area. SEEK IMMEDIATE MEDICAL CARE IF:   There is an injury and open wound to the affected area.  Pain is not adequately relieved with pain medication prescribed or becomes severe.  An oral temperature above 102 F (38.9 C) develops.  The foot/ankle below the affected area becomes suddenly numb or the area feels weak and hard to move. MAKE SURE YOU:   Understand these instructions.  Will watch your condition.  Will get help right away if you are not doing well or get worse. Document Released: 05/27/2006 Document Revised: 04/15/2011 Document Reviewed: 08/04/2006 [facility] Patient Information 2014 [place], [place].

## 2012-07-06 NOTE — Progress Notes (Signed)
  Subjective:    Patient ID: Suzanne Schaefer, female    DOB: 1960-11-04, 51 y.o.   MRN: [id number]  HPI Suzanne Schaefer presents for a several week h/o pedal and LE edema. This seems to have gotten worse since she hurt her back. She has been open sandals and thus she notices more pedal edema. The swelling does go down overnight. She has not had any shortness of breath or chest pain. She has had more headaches.   PMH, FamHx and SocHx reviewed for any changes and relevance.  Current Outpatient Prescriptions on File Prior to Visit  Medication Sig Dispense Refill  . Calcium Polycarbophil (FIBERCON PO) Take by mouth.        . Calcium-Magnesium-Vitamin D (CITRACAL CALCIUM+D PO) Take 2 capsules by mouth.      . cholecalciferol (VITAMIN D) 1000 UNITS tablet Take 2,000 Units by mouth daily.      Marland Kitchen estradiol (MINIVELLE) 0.05 MG/24HR Place 1 patch (0.05 mg total) onto the skin once a week.  8 patch  12  . Flaxseed, Linseed, (FLAXSEED OIL) 1000 MG CAPS Take 3 capsules by mouth 2 (two) times daily.        . Glucosamine-Chondroitin 1500-1200 MG/30ML LIQD Take 1 tablet by mouth 3 (three) times daily.        Marland Kitchen ibuprofen (ADVIL,MOTRIN) 600 MG tablet Take 1 tablet (600 mg total) by mouth every 8 (eight) hours as needed for pain.  65 tablet  1  . methocarbamol (ROBAXIN-750) 750 MG tablet Take 1 tablet (750 mg total) by mouth 4 (four) times daily.  50 tablet  0  . pantoprazole (PROTONIX) 40 MG tablet Take 1 tablet (40 mg total) by mouth daily.  90 tablet  1  . rOPINIRole (REQUIP) 0.25 MG tablet Take 3 tablets (0.75 mg total) by mouth at bedtime.  270 tablet  1   No current facility-administered medications on file prior to visit.      Review of Systems System review is negative for any constitutional, cardiac, pulmonary, GI or neuro symptoms or complaints other than as described in the HPI.     Objective:   Physical Exam Filed Vitals:   07/06/12 1544  BP: 122/80  Pulse: 90  Temp: 97.5 F (36.4 C)   Wt Readings  from Last 3 Encounters:  07/06/12 280 lb 12.8 oz (127.37 kg)  01/23/12 277 lb 3.2 oz (125.737 kg)  09/03/11 259 lb (117.482 kg)   Gen'l- WNWD white woman LE - 1 - 2+ edema both feet      Assessment & Plan:

## 2012-07-06 NOTE — Assessment & Plan Note (Signed)
intermitent LE/pedal edema c/w venous insufficiency in the absence of heart or renal failure. Discussed mechanism in detail and provided handout  Plan Elevate legs  Support hose.  (greater than 50% of 25 min visit spent on education and counseling)

## 2012-10-01 ENCOUNTER — Encounter: Payer: Self-pay | Admitting: Internal Medicine

## 2012-10-01 ENCOUNTER — Ambulatory Visit (INDEPENDENT_AMBULATORY_CARE_PROVIDER_SITE_OTHER): Payer: 59 | Admitting: Internal Medicine

## 2012-10-01 VITALS — BP 128/80 | HR 79 | Temp 97.9°F | Wt 269.4 lb

## 2012-10-01 DIAGNOSIS — M6580 Other synovitis and tenosynovitis, unspecified site: Secondary | ICD-10-CM

## 2012-10-01 DIAGNOSIS — M76892 Other specified enthesopathies of left lower limb, excluding foot: Secondary | ICD-10-CM

## 2012-10-01 NOTE — Patient Instructions (Addendum)
Tendonitis involving the vasta lateralis, tenson fasciae latae and biceps femoris from walking, stairs or bad luck.  Plan Rub of choice, e.g aspercreme  Heat - bayer heat patch, etc  Aleve - twice a day  Tendinitis Tendinitis is swelling and inflammation of the tendons. Tendons are band-like tissues that connect muscle to bone. Tendinitis commonly occurs in the:   Shoulders (rotator cuff).  Heels (Achilles tendon).  Elbows (triceps tendon). CAUSES Tendinitis is usually caused by overusing the tendon, muscles, and joints involved. When the tissue surrounding a tendon (synovium) becomes inflamed, it is called tenosynovitis. Tendinitis commonly develops in people whose jobs require repetitive motions. SYMPTOMS  Pain.  Tenderness.  Mild swelling. DIAGNOSIS Tendinitis is usually diagnosed by physical exam. Your caregiver may also order X-rays or other imaging tests. TREATMENT Your caregiver may recommend certain medicines or exercises for your treatment. HOME CARE INSTRUCTIONS   Use a sling or splint for as long as directed by your caregiver until the pain decreases.  Put ice on the injured area.  Put ice in a plastic bag.  Place a towel between your skin and the bag.  Leave the ice on for 15-20 minutes, 3-4 times a day.  Avoid using the limb while the tendon is painful. Perform gentle range of motion exercises only as directed by your caregiver. Stop exercises if pain or discomfort increase, unless directed otherwise by your caregiver.  Only take over-the-counter or prescription medicines for pain, discomfort, or fever as directed by your caregiver. SEEK MEDICAL CARE IF:   Your pain and swelling increase.  You develop new, unexplained symptoms, especially increased numbness in the hands. MAKE SURE YOU:   Understand these instructions.  Will watch your condition.  Will get help right away if you are not doing well or get worse. Document Released: 01/19/2000 Document  Revised: 04/15/2011 Document Reviewed: 04/09/2010 [facility] Patient Information 2014 [place], [place]. Tendinitis Tendinitis is swelling and inflammation of the tendons. Tendons are band-like tissues that connect muscle to bone. Tendinitis commonly occurs in the:   Shoulders (rotator cuff).  Heels (Achilles tendon).  Elbows (triceps tendon). CAUSES Tendinitis is usually caused by overusing the tendon, muscles, and joints involved. When the tissue surrounding a tendon (synovium) becomes inflamed, it is called tenosynovitis. Tendinitis commonly develops in people whose jobs require repetitive motions. SYMPTOMS  Pain.  Tenderness.  Mild swelling. DIAGNOSIS Tendinitis is usually diagnosed by physical exam. Your caregiver may also order X-rays or other imaging tests. TREATMENT Your caregiver may recommend certain medicines or exercises for your treatment. HOME CARE INSTRUCTIONS   Use a sling or splint for as long as directed by your caregiver until the pain decreases.  Put ice on the injured area.  Put ice in a plastic bag.  Place a towel between your skin and the bag.  Leave the ice on for 15-20 minutes, 3-4 times a day.  Avoid using the limb while the tendon is painful. Perform gentle range of motion exercises only as directed by your caregiver. Stop exercises if pain or discomfort increase, unless directed otherwise by your caregiver.  Only take over-the-counter or prescription medicines for pain, discomfort, or fever as directed by your caregiver. SEEK MEDICAL CARE IF:   Your pain and swelling increase.  You develop new, unexplained symptoms, especially increased numbness in the hands. MAKE SURE YOU:   Understand these instructions.  Will watch your condition.  Will get help right away if you are not doing well or get worse. Document Released: 01/19/2000 Document  Revised: 04/15/2011 Document Reviewed: 04/09/2010 [facility] Patient Information 2014 [place], [place].

## 2012-10-04 NOTE — Progress Notes (Signed)
  Subjective:    Patient ID: Suzanne Schaefer, female    DOB: 02/02/1961, 53 y.o.   MRN: [id number]  HPI Suzanne Schaefer presents with several day h/o pain in the left leg at the lateral aspect of the knee. The pain is worse with standing and walking but does hurt at all times. She does not recall any injury, overuse or change in gait.. She has not prior h/o knee problems but is aware of the additional strain on the knee due to her weight.  PMH, FamHx and SocHx reviewed for any changes and relevance.  Current Outpatient Prescriptions on File Prior to Visit  Medication Sig Dispense Refill  . Calcium Polycarbophil (FIBERCON PO) Take by mouth.        . Calcium-Magnesium-Vitamin D (CITRACAL CALCIUM+D PO) Take 2 capsules by mouth.      . cholecalciferol (VITAMIN D) 1000 UNITS tablet Take 2,000 Units by mouth daily.      . Flaxseed, Linseed, (FLAXSEED OIL) 1000 MG CAPS Take 3 capsules by mouth 2 (two) times daily.        . Glucosamine-Chondroitin 1500-1200 MG/30ML LIQD Take 1 tablet by mouth 3 (three) times daily.        Marland Kitchen ibuprofen (ADVIL,MOTRIN) 600 MG tablet Take 1 tablet (600 mg total) by mouth every 8 (eight) hours as needed for pain.  65 tablet  1  . pantoprazole (PROTONIX) 40 MG tablet Take 1 tablet (40 mg total) by mouth daily.  90 tablet  1  . rOPINIRole (REQUIP) 0.25 MG tablet Take 3 tablets (0.75 mg total) by mouth at bedtime.  270 tablet  1   No current facility-administered medications on file prior to visit.      Review of Systems System review is negative for any constitutional, cardiac, pulmonary, GI or neuro symptoms or complaints other than as described in the HPI.     Objective:   Physical Exam Filed Vitals:   10/01/12 1612  BP: 128/80  Pulse: 79  Temp: 97.9 F (36.6 C)   Wt Readings from Last 3 Encounters:  10/01/12 269 lb 6.4 oz (122.199 kg)  07/06/12 280 lb 12.8 oz (127.37 kg)  01/23/12 277 lb 3.2 oz (125.737 kg)   Gen'l - overweight woman in no distress Cor - 2+ radial,  RRR Pulm - normal respirations MSK - normal ROM left knee. Tender to palpation at the lateral tendonous insertions vastus lateralis, biceps femoris, tensor fasciae latte       Assessment & Plan:  Tendonitis involving the vasta lateralis, tenson fasciae latae and biceps femoris from walking, stairs or bad luck.  Plan Rub of choice, e.g aspercreme  Heat - bayer heat patch, etc  Aleve - twice a day

## 2012-10-19 ENCOUNTER — Encounter: Payer: Self-pay | Admitting: Internal Medicine

## 2012-10-19 DIAGNOSIS — M25571 Pain in right ankle and joints of right foot: Secondary | ICD-10-CM

## 2012-10-22 ENCOUNTER — Encounter: Payer: Self-pay | Admitting: Family Medicine

## 2012-10-22 ENCOUNTER — Ambulatory Visit (INDEPENDENT_AMBULATORY_CARE_PROVIDER_SITE_OTHER): Payer: 59 | Admitting: Family Medicine

## 2012-10-22 VITALS — BP 118/82 | HR 79 | Wt 268.0 lb

## 2012-10-22 DIAGNOSIS — M222X9 Patellofemoral disorders, unspecified knee: Secondary | ICD-10-CM | POA: Insufficient documentation

## 2012-10-22 DIAGNOSIS — M25569 Pain in unspecified knee: Secondary | ICD-10-CM

## 2012-10-22 DIAGNOSIS — M222X2 Patellofemoral disorders, left knee: Secondary | ICD-10-CM

## 2012-10-22 DIAGNOSIS — M25562 Pain in left knee: Secondary | ICD-10-CM | POA: Insufficient documentation

## 2012-10-22 MED ORDER — MELOXICAM 15 MG PO TABS: 15.0000 mg | ORAL_TABLET | Freq: Every day | ORAL | Status: DC

## 2012-10-22 MED ORDER — TRAMADOL HCL 50 MG PO TABS: 50.0000 mg | ORAL_TABLET | Freq: Three times a day (TID) | ORAL | Status: DC | PRN

## 2012-10-22 NOTE — Progress Notes (Signed)
  I'm seeing this patient by the request  of:  Dr. Debby Bud  CC: Left knee pain  HPI: Patient has had one-month duration of left knee pain. Patient states that it hurts more on the lateral aspect. Patient does not remember any true injury. Patient has had trouble from childhood with this knee intermittently. Patient describes the pain as a dull aching sensation that is worse with certain movements such as going down the stairs. Patient has been seen by an orthopedic surgeon previously and had x-rays for now she can tell me is the patella was not in the right place. Patient has not tried any other modalities and nothing seems to be helping. Patient states that the pain is approximately 7/10. She is able to do her other activities of daily living but does notice that she's also started to have some mild ankle discomfort but she thinks is secondary to her compensating for the knee. Patient denies any significant radiation down the leg, but states that she does have swelling in both of her legs.  Past medical, surgical, family and social history reviewed. Medications reviewed all in the electronic medical record.   Review of Systems: No headache, visual changes, nausea, vomiting, diarrhea, constipation, dizziness, abdominal pain, skin rash, fevers, chills, night sweats, weight loss, swollen lymph nodes, body aches, joint swelling, muscle aches, chest pain, shortness of breath, mood changes.   Objective:    Blood pressure 118/82, pulse 79, weight 268 lb (121.564 kg), SpO2 98.00%.   General: No apparent distress alert and oriented x3 mood and affect normal, dressed appropriately. Obese HEENT: Pupils equal, extraocular movements intact Respiratory: Patient's speak in full sentences and does not appear short of breath Cardiovascular: Trace lower extremity edema, non tender, no erythema Skin: Warm dry intact with no signs of infection or rash on extremities or on axial skeleton. Abdomen: Soft  nontender Neuro: Cranial nerves II through XII are intact, neurovascularly intact in all extremities with 2+ DTRs and 2+ pulses. Lymph: No lymphadenopathy of posterior or anterior cervical chain or axillae bilaterally.  Gait normal with good balance and coordination.  MSK: Non tender with full range of motion and good stability and symmetric strength and tone of shoulders, elbows, wrist, hip, and ankles bilaterally.  Knee: Left Normal to inspection with no erythema or effusion or obvious bony abnormalities. Patient's toe patella is significantly laterally displaced. Patient does have tenderness to palpation over the lateral joint line. ROM full in flexion and extension and lower leg rotation. Ligaments with solid consistent endpoints including ACL, PCL, LCL, MCL. Negative Mcmurray's, Apley's, and Thessalonian tests. Positive painful patellar compression. Patellar glide with significant crepitus. Patellar and quadriceps tendons unremarkable. Hamstring and quadriceps strength is normal.  MSK US performed of: Left knee This study was ordered, performed, and interpreted by Terrilee Files D.O.  Knee: All structures visualized. Anteromedial, anterolateral, posteromedial, and posterolateral menisci unremarkable without tearing, fraying, but joint itself does have trace effusion as well as displacement of the lateral meniscus out of the joint.  Patellar Tendon unremarkable on long and transverse views without effusion. No abnormality of prepatellar bursa. LCL and MCL unremarkable on long and transverse views. No abnormality of origin of medial or lateral head of the gastrocnemius. Looking at patient's patella directly there is signs of degenerative changes on the posterior aspect.  IMPRESSION:  Knee joint effusion and early patellofemoral arthritis.   Impression and Recommendations:     This case required medical decision making of moderate complexity.

## 2012-10-22 NOTE — Assessment & Plan Note (Signed)
Patient does have lateral translation as well as with angulation of the left patella. I do think that this is causing early degenerative changes. Patient given home exercises to focus on vastus medialis obliques strengthening. Discussed weight loss Patient will do ten-day course of meloxicam that as needed. In addition to this patient will have tramadol for night. Patient was given a brace for her knee which was fitted by me today Patient will followup in 3-4 weeks. At that time if she continues to have pain we'll try to do an intra-articular injection. I do think this would be beneficial secondary to the effusion that was noted on ultrasound today.

## 2012-10-22 NOTE — Patient Instructions (Signed)
Very nice to meet you Try the brace Meloxicam daily for 10 days then as needed.  Exercises daily Ice 20 minutes 2-3 times a day Come back in 3 weeks. If still having pain let me inject your knee and/or physical therapy.

## 2012-11-03 ENCOUNTER — Encounter: Payer: Self-pay | Admitting: Family Medicine

## 2012-11-12 ENCOUNTER — Encounter: Payer: Self-pay | Admitting: Family Medicine

## 2012-11-12 ENCOUNTER — Ambulatory Visit (INDEPENDENT_AMBULATORY_CARE_PROVIDER_SITE_OTHER): Payer: 59 | Admitting: Family Medicine

## 2012-11-12 VITALS — BP 142/88 | HR 80 | Wt 271.0 lb

## 2012-11-12 DIAGNOSIS — M25579 Pain in unspecified ankle and joints of unspecified foot: Secondary | ICD-10-CM

## 2012-11-12 DIAGNOSIS — M25569 Pain in unspecified knee: Secondary | ICD-10-CM

## 2012-11-12 DIAGNOSIS — M222X2 Patellofemoral disorders, left knee: Secondary | ICD-10-CM

## 2012-11-12 DIAGNOSIS — M25572 Pain in left ankle and joints of left foot: Secondary | ICD-10-CM | POA: Insufficient documentation

## 2012-11-12 NOTE — Assessment & Plan Note (Signed)
Patient does have some chronic ankle instability and does have a lateral column overload. Home exercise program given. Patient was offered a brace and declined it at this time Discussed icing protocol He continues to have pain in 4 weeks will come back and we'll discuss imaging as well as potential bracing.

## 2012-11-12 NOTE — Progress Notes (Signed)
Subjective:    CC: Followup left knee pain  HPI: Patient is a very pleasant 52 year old female who is seen 3 weeks ago for left knee pain. Patient was diagnosed with patellofemoral osteoarthritis. Patient did have a knee effusion at that time that was diagnosed on physical exam as well as ultrasound. Patient was given anti-inflammatories, bracing, and home exercise program. Patient states her knee is approximately 95-100% better. Patient continues to wear the brace just for comfort. Patient has not been doing the exercises on a regular basis. Patient has not taken any medications on a regular basis. Patient did take the meloxicam and it was beneficial. Patient states she Activities of her daily living without any significant discomfort. Patient does state that she is having more ankle pain. Patient states it's the left ankle and lateral aspect. Patient denies any true injury. Patient states that this has been an intermittent problem the can have sharp pain with certain movements. Patient denies any numbness, any radiation of pain or stops her from doing any activity. She denies any significant swelling but does have swelling of the large with bilaterally at all times.  Past medical history, Surgical history, Family history not pertinant except as noted below, Social history, Allergies, and medications have been entered into the medical record, reviewed, and no changes needed.   Review of Systems: No fevers, chills, night sweats, weight loss, chest pain, or shortness of breath.   Objective:   Blood pressure 142/88, pulse 80, weight 271 lb (122.925 kg), SpO2 98.00%.  General: Well Developed, well nourished, and in no acute distress.  Neuro: Alert and oriented x3, extra-ocular muscles intact, sensation grossly intact.  HEENT: Normocephalic, atraumatic, pupils equal round reactive to light, neck supple, no masses, no lymphadenopathy, thyroid nonpalpable.  Skin: Warm and dry, no rashes. Cardiac:  no  lower extremity edema. Respiratory: Not using accessory muscles, speaking in full sentences. Abdominal: NT, soft Gait: Nonantlagic, good balance and coordination Lymphatic: no lymphadenopathy in neck or axillae on palpation, non tender.  Musculoskeletal: Inspection and palpation of the right and left upper extremities including the shoulders elbows and wrist are unremarkable with full range of motion and good muscle strength and tone. Inspection and palpation of the right and left lower extremities including the hips  are unremarkable and nontender with full range of motion and good muscle strength and tone and are symmetric. Knee: Left  Normal to inspection with no erythema or effusion or obvious bony abnormalities. Patient's toe patella is significantly laterally displaced.  Patient nontender to palpation over the lateral joint line.  ROM full in flexion and extension and lower leg rotation.  Ligaments with solid consistent endpoints including ACL, PCL, LCL, MCL.  Negative Mcmurray's, Apley's, and Thessalonian tests.  Positive painful patellar compression.  Patellar glide with significant crepitus.  Patellar and quadriceps tendons unremarkable.  Hamstring and quadriceps strength is normal.  Contralateral knee unremarkable Ankle: Left Trace swelling over the lateral malleolus. On palpation this does feel like potential ganglion cyst Range of motion is full in all directions. Strength is 5/5 in all directions. Stable lateral and medial ligaments; squeeze test and kleiger test unremarkable; Talar dome nontender; No pain at base of 5th MT; No tenderness over cuboid; No tenderness over N spot or navicular prominence No tenderness on posterior aspects of lateral and medial malleolus No sign of peroneal tendon subluxations or tenderness to palpation Negative tarsal tunnel tinel's Able to walk 4 steps. Mild tenderness to palpation over the ATFL  Impression and Recommendations:

## 2012-11-12 NOTE — Assessment & Plan Note (Signed)
Patient's knee pain seems to be doing much better. Encourage her to actually start doing exercises on a regular basis but I think will be the most beneficial. We will hold on doing any other intervention such as a corticosteroid injection until patient starts having worsening pain. Patient does have worsening pain and does not respond well to the injection she would be a candidate for viscous supplementation.

## 2012-11-12 NOTE — Patient Instructions (Addendum)
I am glad your knee is better Keep up the wide shoes and tie them tight.  Do exercises for knee in chair- Keep leg straight and hold up 2 seconds then down, 30 reps 3 sets most days of the week.  Ankle exercises Ice bath 20 minutes 1-2 times daily Come back in 4 weeks if still in pain then will consider brace and potential aspiration of cyst.

## 2012-12-03 ENCOUNTER — Encounter: Payer: Self-pay | Admitting: Gynecology

## 2012-12-03 ENCOUNTER — Encounter: Payer: Self-pay | Admitting: Family Medicine

## 2012-12-07 ENCOUNTER — Ambulatory Visit (INDEPENDENT_AMBULATORY_CARE_PROVIDER_SITE_OTHER): Payer: 59 | Admitting: Gynecology

## 2012-12-07 ENCOUNTER — Encounter: Payer: Self-pay | Admitting: Gynecology

## 2012-12-07 VITALS — BP 128/86 | Ht 62.25 in | Wt 270.0 lb

## 2012-12-07 DIAGNOSIS — A630 Anogenital (venereal) warts: Secondary | ICD-10-CM

## 2012-12-07 DIAGNOSIS — N898 Other specified noninflammatory disorders of vagina: Secondary | ICD-10-CM

## 2012-12-07 LAB — WET PREP FOR TRICH, YEAST, CLUE: Trich, Wet Prep: NONE SEEN

## 2012-12-07 MED ORDER — METRONIDAZOLE 0.75 % VA GEL: VAGINAL | Status: DC

## 2012-12-07 MED ORDER — FLUCONAZOLE 150 MG PO TABS: 150.0000 mg | ORAL_TABLET | Freq: Once | ORAL | Status: DC

## 2012-12-07 NOTE — Patient Instructions (Signed)
Monilial Vaginitis Vaginitis in a soreness, swelling and redness (inflammation) of the vagina and vulva. Monilial vaginitis is not a sexually transmitted infection. CAUSES  Yeast vaginitis is caused by yeast (candida) that is normally found in your vagina. With a yeast infection, the candida has overgrown in number to a point that upsets the chemical balance. SYMPTOMS   White, thick vaginal discharge.  Swelling, itching, redness and irritation of the vagina and possibly the lips of the vagina (vulva).  Burning or painful urination.  Painful intercourse. DIAGNOSIS  Things that may contribute to monilial vaginitis are:  Postmenopausal and virginal states.  Pregnancy.  Infections.  Being tired, sick or stressed, especially if you had monilial vaginitis in the past.  Diabetes. Good control will help lower the chance.  Birth control pills.  Tight fitting garments.  Using bubble bath, feminine sprays, douches or deodorant tampons.  Taking certain medications that kill germs (antibiotics).  Sporadic recurrence can occur if you become ill. TREATMENT  Your caregiver will give you medication.  There are several kinds of anti monilial vaginal creams and suppositories specific for monilial vaginitis. For recurrent yeast infections, use a suppository or cream in the vagina 2 times a week, or as directed.  Anti-monilial or steroid cream for the itching or irritation of the vulva may also be used. Get your caregiver's permission.  Painting the vagina with methylene blue solution may help if the monilial cream does not work.  Eating yogurt may help prevent monilial vaginitis. HOME CARE INSTRUCTIONS   Finish all medication as prescribed.  Do not have sex until treatment is completed or after your caregiver tells you it is okay.  Take warm sitz baths.  Do not douche.  Do not use tampons, especially scented ones.  Wear cotton underwear.  Avoid tight pants and panty  hose.  Tell your sexual partner that you have a yeast infection. They should go to their caregiver if they have symptoms such as mild rash or itching.  Your sexual partner should be treated as well if your infection is difficult to eliminate.  Practice safer sex. Use condoms.  Some vaginal medications cause latex condoms to fail. Vaginal medications that harm condoms are:  Cleocin cream.  Butoconazole (Femstat).  Terconazole (Terazol) vaginal suppository.  Miconazole (Monistat) (may be purchased over the counter). SEEK MEDICAL CARE IF:   You have a temperature by mouth above 102 F (38.9 C).  The infection is getting worse after 2 days of treatment.  The infection is not getting better after 3 days of treatment.  You develop blisters in or around your vagina.  You develop vaginal bleeding, and it is not your menstrual period.  You have pain when you urinate.  You develop intestinal problems.  You have pain with sexual intercourse. Document Released: 10/31/2004 Document Revised: 04/15/2011 Document Reviewed: 07/15/2008 [facility] Patient Information 2014 [place], [place]. Bacterial Vaginosis Bacterial vaginosis (BV) is a vaginal infection where the normal balance of bacteria in the vagina is disrupted. The normal balance is then replaced by an overgrowth of certain bacteria. There are several different kinds of bacteria that can cause BV. BV is the most common vaginal infection in women of childbearing age. CAUSES   The cause of BV is not fully understood. BV develops when there is an increase or imbalance of harmful bacteria.  Some activities or behaviors can upset the normal balance of bacteria in the vagina and put women at increased risk including:  Having a new sex partner or multiple  sex partners.  Douching.  Using an intrauterine device (IUD) for contraception.  It is not clear what role sexual activity plays in the development of BV. However, women that have  never had sexual intercourse are rarely infected with BV. Women do not get BV from toilet seats, bedding, swimming pools or from touching objects around them.  SYMPTOMS   Grey vaginal discharge.  A fish-like odor with discharge, especially after sexual intercourse.  Itching or burning of the vagina and vulva.  Burning or pain with urination.  Some women have no signs or symptoms at all. DIAGNOSIS  Your caregiver must examine the vagina for signs of BV. Your caregiver will perform lab tests and look at the sample of vaginal fluid through a microscope. They will look for bacteria and abnormal cells (clue cells), a pH test higher than 4.5, and a positive amine test all associated with BV.  RISKS AND COMPLICATIONS   Pelvic inflammatory disease (PID).  Infections following gynecology surgery.  Developing HIV.  Developing herpes virus. TREATMENT  Sometimes BV will clear up without treatment. However, all women with symptoms of BV should be treated to avoid complications, especially if gynecology surgery is planned. Female partners generally do not need to be treated. However, BV may spread between female sex partners so treatment is helpful in preventing a recurrence of BV.   BV may be treated with antibiotics. The antibiotics come in either pill or vaginal cream forms. Either can be used with nonpregnant or pregnant women, but the recommended dosages differ. These antibiotics are not harmful to the baby.  BV can recur after treatment. If this happens, a second round of antibiotics will often be prescribed.  Treatment is important for pregnant women. If not treated, BV can cause a premature delivery, especially for a pregnant woman who had a premature birth in the past. All pregnant women who have symptoms of BV should be checked and treated.  For chronic reoccurrence of BV, treatment with a type of prescribed gel vaginally twice a week is helpful. HOME CARE INSTRUCTIONS   Finish all  medication as directed by your caregiver.  Do not have sex until treatment is completed.  Tell your sexual partner that you have a vaginal infection. They should see their caregiver and be treated if they have problems, such as a mild rash or itching.  Practice safe sex. Use condoms. Only have 1 sex partner. PREVENTION  Basic prevention steps can help reduce the risk of upsetting the natural balance of bacteria in the vagina and developing BV:  Do not have sexual intercourse (be abstinent).  Do not douche.  Use all of the medicine prescribed for treatment of BV, even if the signs and symptoms go away.  Tell your sex partner if you have BV. That way, they can be treated, if needed, to prevent reoccurrence. SEEK MEDICAL CARE IF:   Your symptoms are not improving after 3 days of treatment.  You have increased discharge, pain, or fever. MAKE SURE YOU:   Understand these instructions.  Will watch your condition.  Will get help right away if you are not doing well or get worse. FOR MORE INFORMATION  Division of STD Prevention (DSTDP), Centers for Disease Control and Prevention: [employer] American Social Health Association (ASHA): www.ashastd.org  Document Released: 01/21/2005 Document Revised: 04/15/2011 Document Reviewed: 07/14/2008 [facility] Patient Information 2014 [place], [place].

## 2012-12-07 NOTE — Progress Notes (Signed)
The patient is a 52 year old who presented to the office today with concerns of some vulvar lesion that she had noted. Patient is scheduled to return to the office next month for her annual exam. Patient was previously been followed by Dr. Nicholas Lose. Review of records as follows:  Mammogram October 2013 Colonoscopy 2006 Dr. Oscar La Bone density study normal in 2008 Last Pap smear 2012  Patient with past history of vulvar condyloma. 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  Patient has also had in the past molluscum contagiosum in 2013  In 2011 patient had a negative GC and chlamydia culture as well as negative HIV and RPR  Normal mammogram October 2013  Left breast needle core biopsy 2012 benign fibroadenoma  History of hydrosalpinx post fimbriectomy 1998 as well as total salpingectomy 1996. Patient with past history of high-grade CIN I. LEEP cervical conization several years ago   Exam: Physical Exam  Genitourinary:     with the use of a large magnifying lens the external genitalia perineum and perirectal area were inspected as well as the vagina and cervix. The small isolated the rugous like lesion versus molluscum contagiosum was noted upper lateral labia majora region. The area was treated with TCA 50% the patient will return back in one week for followup in the event that it didn't clear. No other abnormalities were noted. Patient had been complaining of slight vaginal discharge and some bacteria and bacterial overgrowth was noted so she will be treated with Diflucan 150 mg one by mouth today along with MetroGel vaginal cream to apply each bedtime for 5-7 days.

## 2012-12-10 ENCOUNTER — Other Ambulatory Visit: Payer: Self-pay

## 2012-12-10 ENCOUNTER — Ambulatory Visit (INDEPENDENT_AMBULATORY_CARE_PROVIDER_SITE_OTHER): Payer: 59 | Admitting: Family Medicine

## 2012-12-10 ENCOUNTER — Other Ambulatory Visit (INDEPENDENT_AMBULATORY_CARE_PROVIDER_SITE_OTHER): Payer: 59

## 2012-12-10 ENCOUNTER — Encounter: Payer: Self-pay | Admitting: Family Medicine

## 2012-12-10 VITALS — BP 126/84 | HR 77

## 2012-12-10 DIAGNOSIS — M25569 Pain in unspecified knee: Secondary | ICD-10-CM

## 2012-12-10 DIAGNOSIS — M1712 Unilateral primary osteoarthritis, left knee: Secondary | ICD-10-CM

## 2012-12-10 DIAGNOSIS — IMO0002 Reserved for concepts with insufficient information to code with codable children: Secondary | ICD-10-CM

## 2012-12-10 DIAGNOSIS — M1710 Unilateral primary osteoarthritis, unspecified knee: Secondary | ICD-10-CM

## 2012-12-10 DIAGNOSIS — M222X2 Patellofemoral disorders, left knee: Secondary | ICD-10-CM

## 2012-12-10 NOTE — Patient Instructions (Signed)
Always good to see you Continue what you are doing. Tylenol 650 mg 3 times a day Tumeric 500mg  2 times a day.  Come back when you need, we will consider an injection when you put up the white flag.  You can also call and we can start PT if needed.

## 2012-12-10 NOTE — Assessment & Plan Note (Signed)
Patient does have osteoarthritis of the left knee. Patient was found to start having her back in 2008 on their or MRI. Ultrasound shows likely progression. Encourage weight loss Discussed over-the-counter medications including vitamin D, fish oil, tumeric.  Encourage patient to continue the brace as need Patient was offered again a steroid injection which she declined. Patient will followup on an as-needed basis. If patient calls in the interim he continues to have pain I would send her to formal physical therapy 2 times a week for 4-6 weeks.

## 2012-12-10 NOTE — Progress Notes (Signed)
Subjective:    CC: Followup left knee pain  HPI: Patient is a very pleasant 52 year old female who is seen for left knee pain. Patient was diagnosed with patellofemoral osteoarthritis. Patient did have a knee effusion at that time that was diagnosed on physical exam as well as ultrasound. Patient was given anti-inflammatories, bracing, and home exercise program. Patient was doing very good at last followup.  Patient states though since the last week she started having worsening pain in his left knee. Patient states it is still much better than the initial visit. Patient is continuing to wear the brace as well as take the medicines on an as-needed basis. Patient denies any worsening of the swelling that she had previously. Patient is notices more of a dull ache at night. Patient has stopped the other over-the-counter medications that we discussed previously. No radiation of pain no numbness and still able to do activities of daily living without any significant discomfort.  Ankle seems to be doing well.  Past medical history, Surgical history, Family history not pertinant except as noted below, Social history, Allergies, and medications have been entered into the medical record, reviewed, and no changes needed.   Review of Systems: No fevers, chills, night sweats, weight loss, chest pain, or shortness of breath.   Objective:   Blood pressure 126/84, pulse 77, SpO2 98.00%.  General: Well Developed, well nourished, and in no acute distress.  Neuro: Alert and oriented x3, extra-ocular muscles intact, sensation grossly intact.  HEENT: Normocephalic, atraumatic, pupils equal round reactive to light, neck supple, no masses, no lymphadenopathy, thyroid nonpalpable.  Skin: Warm and dry, no rashes. Cardiac:  no lower extremity edema. Respiratory: Not using accessory muscles, speaking in full sentences. Abdominal: NT, soft Gait: Nonantlagic, good balance and coordination Lymphatic: no lymphadenopathy  in neck or axillae on palpation, non tender.  Musculoskeletal: Inspection and palpation of the right and left upper extremities including the shoulders elbows and wrist are unremarkable with full range of motion and good muscle strength and tone. Inspection and palpation of the right and left lower extremities including the hips  are unremarkable and nontender with full range of motion and good muscle strength and tone and are symmetric. Knee: Left  Normal to inspection with no erythema or effusion or obvious bony abnormalities. Patient's  patella is significantly laterally displaced.  Patient nontender to palpation over the lateral joint line but extremely tender to palpation over the medial joint line ROM full in flexion and extension and lower leg rotation.  Ligaments with solid consistent endpoints including ACL, PCL, LCL, MCL.  Negative Mcmurray's, Apley's, and Thessalonian tests.  Positive painful patellar compression.  Patellar glide with significant crepitus.  Patellar and quadriceps tendons unremarkable.  Hamstring and quadriceps strength is normal.  Contralateral knee unremarkable  MSK US performed of: Left knee This study was ordered, performed, and interpreted by Terrilee Files D.O.  Knee: All structures visualized. Patient does have significant subcutaneous tissue Anteromedial, anterolateral, posteromedial, and posterolateral menisci unremarkable without tearing, fraying, effusion, or displacement. Patient does have a large bone spur over the medial joint line with hypoechoic changes in this area. Patellar Tendon unremarkable on long and transverse views without effusion. No abnormality of prepatellar bursa. LCL and MCL unremarkable on long and transverse views. No abnormality of origin of medial or lateral head of the gastrocnemius.  IMPRESSION:  Moderate osteoarthritic changes of the knee joint, worse with the medial compartment with a very large bone spur..   Impression and  Recommendations:

## 2012-12-11 ENCOUNTER — Encounter: Payer: Self-pay | Admitting: Internal Medicine

## 2013-01-19 ENCOUNTER — Encounter: Payer: Self-pay | Admitting: Internal Medicine

## 2013-01-19 ENCOUNTER — Encounter: Payer: Self-pay | Admitting: Family Medicine

## 2013-01-25 ENCOUNTER — Other Ambulatory Visit (HOSPITAL_COMMUNITY)
Admission: RE | Admit: 2013-01-25 | Discharge: 2013-01-25 | Disposition: A | Discharge status: Home / Self Care | Payer: 59 | Source: Ambulatory Visit | Attending: Gynecology | Admitting: Gynecology

## 2013-01-25 ENCOUNTER — Encounter: Payer: Self-pay | Admitting: Gynecology

## 2013-01-25 ENCOUNTER — Ambulatory Visit (INDEPENDENT_AMBULATORY_CARE_PROVIDER_SITE_OTHER): Payer: 59 | Admitting: Gynecology

## 2013-01-25 VITALS — BP 124/82 | Ht 62.25 in | Wt 268.0 lb

## 2013-01-25 DIAGNOSIS — Z1151 Encounter for screening for human papillomavirus (HPV): Secondary | ICD-10-CM | POA: Insufficient documentation

## 2013-01-25 DIAGNOSIS — Z124 Encounter for screening for malignant neoplasm of cervix: Secondary | ICD-10-CM | POA: Insufficient documentation

## 2013-01-25 DIAGNOSIS — Z780 Asymptomatic menopausal state: Secondary | ICD-10-CM

## 2013-01-25 DIAGNOSIS — R6882 Decreased libido: Secondary | ICD-10-CM

## 2013-01-25 DIAGNOSIS — Z113 Encounter for screening for infections with a predominantly sexual mode of transmission: Secondary | ICD-10-CM

## 2013-01-25 DIAGNOSIS — Z01419 Encounter for gynecological examination (general) (routine) without abnormal findings: Secondary | ICD-10-CM

## 2013-01-25 DIAGNOSIS — G4700 Insomnia, unspecified: Secondary | ICD-10-CM

## 2013-01-25 DIAGNOSIS — N951 Menopausal and female climacteric states: Secondary | ICD-10-CM

## 2013-01-25 MED ORDER — ESZOPICLONE 2 MG PO TABS: 2.0000 mg | ORAL_TABLET | Freq: Every evening | ORAL | Status: DC | PRN

## 2013-01-25 NOTE — Progress Notes (Addendum)
Suzanne Schaefer 06/21/1960 [id number] Patient is a 52 year old who was seen as a new patient on November 3 concerned about a vulvar lesion. Patient with past history of vulvar condyloma and a a small irregular-like lesion with characteristic features of condyloma was treated with TCA 50% in the area of the upper lateral labia majora on 10/07/2012 which has resolved.  Patient was previously a patient of Dr. Nicholas Lose in her chart was reviewed which demonstrated the following:  Mammogram October 2013/ left breast biopsy 2012 benign fibroadenoma  Colonoscopy 2006 Dr. Arlyce Dice / diverticulosis Bone density study normal in 2008  Last Pap smear 2012  Patient with past history of vulvar condyloma. 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  Patient has also had in the past molluscum contagiosum in 2013  In 2011 patient had a negative GC and chlamydia culture as well as negative HIV and RPR  Normal mammogram October 2013  Left breast needle core biopsy 2012 benign fibroadenoma  History of hydrosalpinx post fimbriectomy 1998 as well as total salpingectomy 1996. Patient with past history of high-grade CIN I. LEEP cervical conization several years ago    Patient was complaining of insomnia sometimes hot flashes and decreased libido. She had been on the Vivelle 0.075 mg twice a week for only one year in an effort to improve her libido and she discontinued it in June of this year. She has had no vaginal bleeding. Dr. Debby Bud is her PCP who has been doing her blood work.  Past medical history,surgical history, family history and social history were all reviewed and documented in the EPIC chart.  Gynecologic History No LMP recorded. Patient is not currently having periods (Reason:  Perimenopausal). Contraception: tubal ligation Last Pap: 2013. Results were: normal Last mammogram: 2014. Results were: normal  Obstetric History OB History  Gravida Para Term Preterm AB SAB TAB Ectopic Multiple Living  2 2        2     # Outcome Date GA Lbr Len/2nd Weight Sex Delivery Anes PTL Lv  2 PAR           1 PAR                ROS: A ROS was performed and pertinent positives and negatives are included in the history.  GENERAL: No fevers or chills. HEENT: No change in vision, no earache, sore throat or sinus congestion. NECK: No pain or stiffness. CARDIOVASCULAR: No chest pain or pressure. No palpitations. PULMONARY: No shortness of breath, cough or wheeze. GASTROINTESTINAL: No abdominal pain, nausea, vomiting or diarrhea, melena or bright red blood per rectum. GENITOURINARY: No urinary frequency, urgency, hesitancy or dysuria. MUSCULOSKELETAL: No joint or muscle pain, no back pain, no recent trauma. DERMATOLOGIC: No rash, no itching, no lesions. ENDOCRINE: No polyuria, polydipsia, no heat or cold intolerance. No recent change in weight. HEMATOLOGICAL: No anemia or easy bruising or bleeding. NEUROLOGIC: No headache, seizures, numbness, tingling or weakness. PSYCHIATRIC: No depression, no loss of interest in normal activity or change in sleep pattern.     Exam: chaperone present  BP 124/82  Ht 5' 2.25" (1.581 m)  Wt 268 lb (121.564 kg)  BMI 48.63 kg/m2  Body mass index is 48.63 kg/(m^2).  General appearance : Well developed  well nourished female. No acute distress HEENT: Neck supple, trachea midline, no carotid bruits, no thyroidmegaly Lungs: Clear to auscultation, no rhonchi or wheezes, or rib retractions  Heart: Regular rate and rhythm, no murmurs or gallops Breast:Examined in sitting and supine position were symmetrical in appearance, no palpable masses or tenderness,  no skin retraction, no nipple inversion, no nipple discharge, no skin discoloration, no axillary or  supraclavicular lymphadenopathy Abdomen: no palpable masses or tenderness, no rebound or guarding Extremities: no edema or skin discoloration or tenderness  Pelvic:  Bartholin, Urethra, Skene Glands: Within normal limits             Vagina: No gross lesions or discharge  Cervix: No gross lesions or discharge  Uterus  anteverted, normal size, shape and consistency, non-tender and mobile  Adnexa  Without masses or tenderness  Anus and perineum  normal   Rectovaginal  normal sphincter tone without palpated masses or tenderness             Hemoccult PCP we'll provide     Assessment/Plan:  52 y.o. female for annual exam who is menopausal with minimal vasomotor symptoms but it is contributing to her insomnia and decreased libido. She will be started on Lunesta 2 mg to take one by mouth when necessary insomnia. She will make an appointment to return to the office to discuss treatments to help with her libido we did give her some suggestions today such as looking out for daily stresses that could be contributing. She was reminded to do her monthly breast exam. Pap smear was done today. She was requesting STD screen so a GC and chlamydia culture was obtained today along with an HIV, RPR, hepatitis B, hepatitis C. She will schedule a bone density study here in office in the next couple of months. We discussed importance of calcium vitamin D and regular exercise for osteoporosis prevention. Because of her decreased libido A. Total testosterone level will be drawn today.  Note: This dictation was prepared with  Dragon/digital dictation along withSmart phrase technology. Any transcriptional errors that result from this process are unintentional.   Ok Edwards MD, 4:27 PM 01/25/2013

## 2013-01-25 NOTE — Patient Instructions (Addendum)
Bone Densitometry Bone densitometry is a special X-ray that measures your bone density and can be used to help predict your risk of bone fractures. This test is used to determine bone mineral content and density to diagnose osteoporosis. Osteoporosis is the loss of bone that may cause the bone to become weak. Osteoporosis commonly occurs in women entering menopause. However, it may be found in men and in people with other diseases. PREPARATION FOR TEST No preparation necessary. WHO SHOULD BE TESTED?  All women older than 64.  Postmenopausal women (50 to 25) with risk factors for osteoporosis.  People with a previous fracture caused by normal activities.  People with a small body frame (less than 127 poundsor a body mass index [BMI] of less than 21).  People who have a parent with a hip fracture or history of osteoporosis.  People who smoke.  People who have rheumatoid arthritis.  Anyone who engages in excessive alcohol use (more than 3 drinks most days).  Women who experience early menopause. WHEN SHOULD YOU BE RETESTED? Current guidelines suggest that you should wait at least 2 years before doing a bone density test again if your first test was normal.Recent studies indicated that women with normal bone density may be able to wait a few years before needing to repeat a bone density test. You should discuss this with your caregiver.  NORMAL FINDINGS   Normal: less than standard deviation below normal (greater than -1).  Osteopenia: 1 to 2.5 standard deviations below normal (-1 to -2.5).  Osteoporosis: greater than 2.5 standard deviations below normal (less than -2.5). Test results are reported as a "T score" and a "Z score."The T score is a number that compares your bone density with the bone density of healthy, young women.The Z score is a number that compares your bone density with the scores of women who are the same age, gender, and race.  Ranges for normal findings may vary  among different laboratories and hospitals. You should always check with your doctor after having lab work or other tests done to discuss the meaning of your test results and whether your values are considered within normal limits. MEANING OF TEST  Your caregiver will go over the test results with you and discuss the importance and meaning of your results, as well as treatment options and the need for additional tests if necessary. OBTAINING THE TEST RESULTS It is your responsibility to obtain your test results. Ask the lab or department performing the test when and how you will get your results. Document Released: 02/13/2004 Document Revised: 04/15/2011 Document Reviewed: 03/07/2010 [facility] Patient Information 2014 [place], [place]. Eszopiclone tablets What is this medicine? ESZOPICLONE (es ZOE pi clone) is used to treat insomnia. This medicine helps you to fall asleep and sleep through the night. This medicine may be used for other purposes; ask your health care provider or pharmacist if you have questions. COMMON BRAND NAME(S): Lunesta What should I tell my health care provider before I take this medicine? They need to know if you have any of these conditions: -depression -history of a drug or alcohol abuse problem -liver disease -lung or breathing disease -suicidal thoughts -an unusual or allergic reaction to eszopiclone, other medicines, foods, dyes, or preservatives -pregnant or trying to get pregnant -breast-feeding How should I use this medicine? Take this medicine by mouth with a glass of water. Follow the directions on the prescription label. It is better to take this medicine on an empty stomach and only when  you are ready for bed. Do not take your medicine more often than directed. If you have been taking this medicine for several weeks and suddenly stop taking it, you may get unpleasant withdrawal symptoms. Your doctor or health care professional may want to gradually reduce the  dose. Do not stop taking this medicine on your own. Always follow your doctor or health care professional's advice. Talk to your pediatrician regarding the use of this medicine in children. Special care may be needed. Overdosage: If you think you have taken too much of this medicine contact a poison control center or emergency room at once. NOTE: This medicine is only for you. Do not share this medicine with others. What if I miss a dose? This does not apply. This medicine should only be taken immediately before going to sleep. Do not take double or extra doses. What may interact with this medicine? -herbal medicines like kava kava, melatonin, St. John's wort and valerian -lorazepam -medicines for fungal infections like ketoconazole, fluconazole, or itraconazole -olanzapine This list may not describe all possible interactions. Give your health care provider a list of all the medicines, herbs, non-prescription drugs, or dietary supplements you use. Also tell them if you smoke, drink alcohol, or use illegal drugs. Some items may interact with your medicine. What should I watch for while using this medicine? Visit your doctor or health care professional for regular checks on your progress. Keep a regular sleep schedule by going to bed at about the same time nightly. Avoid caffeine-containing drinks in the evening hours, as caffeine can cause trouble with falling asleep. Talk to your doctor if you still have trouble sleeping. Do not take this medicine unless you are able to get a full night's sleep before you must be active again. You may not be able to remember things that you do in the hours after you take this medicine. Some people have reported driving, making phone calls, or preparing and eating food while asleep after taking sleep medicine. Take this medicine right before going to sleep. Tell your doctor if you have any problems with your memory. After you stop taking this medicine, you may notice  some trouble falling asleep. This is called rebound insomnia. This problem usually goes away on its own after 1 or 2 nights. You may get drowsy or dizzy. Do not drive, use machinery, or do anything that needs mental alertness until you know how this medicine affects you. Do not stand or sit up quickly, especially if you are an older patient. This reduces the risk of dizzy or fainting spells. Alcohol may interfere with the effect of this medicine. Avoid alcoholic drinks. This medicine may cause a decrease in mental alertness the day after use, even if you feel that you are fully awake. Tell your doctor if you will need to perform activities requiring full alertness, such as driving, the next day after you have taken this medicine. What side effects may I notice from receiving this medicine? Side effects that you should report to your doctor or health care professional as soon as possible: -allergic reactions like skin rash, itching or hives, swelling of the face, lips, or tongue -changes in vision -confusion -depressed mood -feeling faint or lightheaded, falls -hallucinations -problems with balance, speaking, walking -restlessness, excitability, or feelings of agitation -unusual activities while asleep like driving, eating, making phone calls Side effects that usually do not require medical attention (report to your doctor or health care professional if they continue or are bothersome): -  dizziness, or daytime drowsiness, sometimes called a hangover effect -headache This list may not describe all possible side effects. Call your doctor for medical advice about side effects. You may report side effects to FDA at 1-800-FDA-1088. Where should I keep my medicine? Keep out of the reach of children. This medicine can be abused. Keep your medicine in a safe place to protect it from theft. Do not share this medicine with anyone. Selling or giving away this medicine is dangerous and against the law. Store at  room temperature between 15 and 30 degrees C (59 and 86 degrees F). Throw away any unused medicine after the expiration date. NOTE: This sheet is a summary. It may not cover all possible information. If you have questions about this medicine, talk to your doctor, pharmacist, or health care provider.  2014, Elsevier/Gold Standard. (2012-06-18 17:42:58)

## 2013-01-26 ENCOUNTER — Ambulatory Visit (INDEPENDENT_AMBULATORY_CARE_PROVIDER_SITE_OTHER): Payer: 59 | Admitting: Internal Medicine

## 2013-01-26 ENCOUNTER — Encounter: Payer: Self-pay | Admitting: Gynecology

## 2013-01-26 ENCOUNTER — Encounter: Payer: Self-pay | Admitting: Internal Medicine

## 2013-01-26 VITALS — BP 130/90 | HR 77 | Temp 97.1°F | Ht 63.0 in | Wt 236.8 lb

## 2013-01-26 DIAGNOSIS — G4733 Obstructive sleep apnea (adult) (pediatric): Secondary | ICD-10-CM

## 2013-01-26 DIAGNOSIS — Z0000 Encounter for general adult medical examination without abnormal findings: Secondary | ICD-10-CM

## 2013-01-26 DIAGNOSIS — D509 Iron deficiency anemia, unspecified: Secondary | ICD-10-CM

## 2013-01-26 DIAGNOSIS — E669 Obesity, unspecified: Secondary | ICD-10-CM

## 2013-01-26 LAB — TESTOSTERONE: Testosterone: 17 ng/dL (ref 10–70)

## 2013-01-26 LAB — RPR

## 2013-01-26 LAB — HEPATITIS B SURFACE ANTIGEN: Hepatitis B Surface Ag: NEGATIVE

## 2013-01-26 LAB — HEPATITIS C ANTIBODY: HCV Ab: NEGATIVE

## 2013-01-26 MED ORDER — ROPINIROLE HCL 0.25 MG PO TABS: 0.7500 mg | ORAL_TABLET | Freq: Every day | ORAL | Status: DC

## 2013-01-26 MED ORDER — PANTOPRAZOLE SODIUM 40 MG PO TBEC: 40.0000 mg | DELAYED_RELEASE_TABLET | Freq: Every day | ORAL | Status: DC

## 2013-01-26 NOTE — Progress Notes (Signed)
Pre visit review using our clinic review tool, if applicable. No additional management support is needed unless otherwise documented below in the visit note. 

## 2013-01-26 NOTE — Progress Notes (Signed)
Subjective:    Patient ID: Suzanne Schaefer, female    DOB: May 06, 1960, 52 y.o.   MRN: [id number]  HPI Cereniti presents for a general medical wellness. She did see Dr. Lily Peer for routine woman exam Dec 22nd - PAP results pending, she had a breast exam by Dr. Lily Peer. STD labs were also done. She has had a mammogram - normal. She has had eye exam - normal. Currentr with the dentist.   Past Medical History  Diagnosis Date  . Restless leg syndrome   . Obstructive sleep apnea   . Tonsillitis   . Esophagitis   . Allergic rhinitis   . History of sinusitis   . Obesity    Past Surgical History  Procedure Laterality Date  . Cervical biopsy    . Laparoscopic cholecystectomy      '92  . Bilat salpingectomy    . Cesarean section     Family History  Problem Relation Age of Onset  . Asthma Mother   . COPD Mother   . Cancer Mother     PERITONEAL   . Hypertension Father   . Obesity Father   . Glaucoma Father    History   Social History  . Marital Status: Divorced    Spouse Name: N/A    Number of Children: N/A  . Years of Education: N/A   Occupational History  . Not on file.   Social History Main Topics  . Smoking status: Never Smoker   . Smokeless tobacco: Never Used  . Alcohol Use: No  . Drug Use: No  . Sexual Activity: Yes    Partners: Male   Other Topics Concern  . Not on file   Social History Narrative   HSG. Married 22- divorce '89. 2 daughters '86, 88. Work Counsellor. Lives in own home daughters at home    Current Outpatient Prescriptions on File Prior to Visit  Medication Sig Dispense Refill  . Calcium-Magnesium-Vitamin D (CITRACAL CALCIUM+D PO) Take 2 capsules by mouth.      . cholecalciferol (VITAMIN D) 1000 UNITS tablet Take 2,000 Units by mouth daily.      . Flaxseed, Linseed, (FLAXSEED OIL) 1000 MG CAPS Take 3 capsules by mouth 2 (two) times daily.        . Glucosamine-Chondroitin 1500-1200 MG/30ML LIQD Take 1 tablet by mouth 3 (three)  times daily.        . pantoprazole (PROTONIX) 40 MG tablet Take 1 tablet (40 mg total) by mouth daily.  90 tablet  1  . Polyethylene Glycol 3350 (MIRALAX PO) Take by mouth.      Marland Kitchen rOPINIRole (REQUIP) 0.25 MG tablet Take 3 tablets (0.75 mg total) by mouth at bedtime.  270 tablet  1  . Calcium Polycarbophil (FIBERCON PO) Take by mouth.        . eszopiclone (LUNESTA) 2 MG TABS tablet Take 1 tablet (2 mg total) by mouth at bedtime as needed for sleep. Take immediately before bedtime  30 tablet  2  . fluconazole (DIFLUCAN) 150 MG tablet Take 1 tablet (150 mg total) by mouth once.  1 tablet  0  . metroNIDAZOLE (METROGEL) 0.75 % vaginal gel Apply qhs for 1 week  70 g  0   No current facility-administered medications on file prior to visit.      Review of Systems Constitutional:  Negative for fever, chills, activity change and unexpected weight change.  HEENT:  Negative for hearing loss, ear pain, congestion, neck stiffness and  postnasal drip. Negative for sore throat or swallowing problems. Negative for dental complaints.   Eyes: Negative for vision loss or change in visual acuity.  Respiratory: Negative for chest tightness and wheezing. Negative for DOE.   Cardiovascular: Negative for chest pain or palpitations. No decreased exercise tolerance Gastrointestinal: No change in bowel habit. No bloating or gas. No reflux or indigestion Genitourinary: Negative for urgency, frequency, flank pain and difficulty urinating.  Musculoskeletal: Negative for myalgias, back pain and gait problem. Knee pain. Neurological: Negative for dizziness, tremors, weakness and headaches.  Hematological: Negative for adenopathy.  Psychiatric/Behavioral: Negative for behavioral problems and dysphoric mood.       Objective:   Physical Exam Filed Vitals:   01/26/13 1342  BP: 130/90  Pulse: 77  Temp: 97.1 F (36.2 C)   Wt Readings from Last 3 Encounters:  01/26/13 236 lb 12.8 oz (107.412 kg)  01/25/13 268 lb  (121.564 kg)  12/07/12 270 lb (122.471 kg)   Gen'l: well nourished, well developed, obese Woman in no distress HEENT - [place]/AT, EACs/TMs normal, oropharynx with native dentition in good condition, no buccal or palatal lesions, posterior pharynx clear, mucous membranes moist. C&S clear, PERRLA, fundi - normal Neck - supple, no thyromegaly Nodes- negative submental, cervical, supraclavicular regions Chest - no deformity, no CVAT Lungs - clear without rales, wheezes. No increased work of breathing Breast -deferred to Dr. Lily Peer Cardiovascular - regular rate and rhythm, quiet precordium, no murmurs, rubs or gallops, 2+ radial, DP and PT pulses Abdomen - BS+ x 4, obese,no HSM but exam hindered by girth, no guarding or rebound or tenderness Pelvic - deferred to gyn Rectal - deferred to gyn Extremities - no clubbing, cyanosis, edema or deformity.  Neuro - A&O x 3, CN II-XII normal, motor strength normal and equal, DTRs 2+ and symmetrical biceps, radial, and patellar tendons. Cerebellar - no tremor, no rigidity, fluid movement and normal gait. Derm - Head, neck, back, abdomen and extremities without suspicious lesions  Recent Results (from the past 2160 hour(s))  WET PREP FOR TRICH, YEAST, CLUE     Status: Abnormal   Collection Time    12/07/12 12:19 PM      Result Value Range   Yeast Wet Prep HPF POC RARE (*) NONE SEEN   Trich, Wet Prep NONE SEEN  NONE SEEN   Clue Cells Wet Prep HPF POC NONE SEEN  NONE SEEN   WBC, Wet Prep HPF POC RARE  NONE SEEN   Comment: RARE BACTERIA SEEN     (0-2) EPITH. CELLS PER HPF     AMINE NEGATIVE  HEPATITIS B SURFACE ANTIGEN     Status: None   Collection Time    01/25/13  3:31 PM      Result Value Range   Hepatitis B Surface Ag NEGATIVE  NEGATIVE  HEPATITIS C ANTIBODY     Status: None   Collection Time    01/25/13  3:31 PM      Result Value Range   HCV Ab NEGATIVE  NEGATIVE  RPR     Status: None   Collection Time    01/25/13  3:31 PM      Result Value  Range   RPR NON REAC  NON REAC  HIV ANTIBODY (ROUTINE TESTING)     Status: None   Collection Time    01/25/13  3:31 PM      Result Value Range   HIV NON REACTIVE  NON REACTIVE  TESTOSTERONE     Status:  None   Collection Time    01/25/13  3:31 PM      Result Value Range   Testosterone 17  10 - 70 ng/dL   Comment:           Tanner Stage       Female              Female                   I              < 30 ng/dL        < 10 ng/dL                   II             < 150 ng/dL       < 30 ng/dL                   III            100-320 ng/dL     < 35 ng/dL                   IV             200-970 ng/dL     96-04 ng/dL                   V/Adult        300-890 ng/dL     54-09 ng/dL        HEPATIC FUNCTION PANEL     Status: Abnormal   Collection Time    01/27/13  8:16 AM      Result Value Range   Total Bilirubin 0.5  0.3 - 1.2 mg/dL   Bilirubin, Direct 0.1  0.0 - 0.3 mg/dL   Alkaline Phosphatase 98  39 - 117 U/L   AST 43 (*) 0 - 37 U/L   ALT 18  0 - 35 U/L   Total Protein 7.8  6.0 - 8.3 g/dL   Albumin 4.3  3.5 - 5.2 g/dL  BASIC METABOLIC PANEL     Status: Abnormal   Collection Time    01/27/13  8:16 AM      Result Value Range   Sodium 138  135 - 145 mEq/L   Potassium 3.9  3.5 - 5.1 mEq/L   Chloride 104  96 - 112 mEq/L   CO2 26  19 - 32 mEq/L   Glucose, Bld 104 (*) 70 - 99 mg/dL   BUN 16  6 - 23 mg/dL   Creatinine, Ser 0.8  0.4 - 1.2 mg/dL   Calcium 9.3  8.4 - 81.1 mg/dL   GFR 91.47  >82.95 mL/min  TSH     Status: None   Collection Time    01/27/13  8:16 AM      Result Value Range   TSH 1.08  0.35 - 5.50 uIU/mL  LIPID PANEL     Status: Abnormal   Collection Time    01/27/13  8:16 AM      Result Value Range   Cholesterol 201 (*) 0 - 200 mg/dL   Comment: ATP III Classification       Desirable:  < 200 mg/dL               Borderline High:  200 - 239 mg/dL          High:  > =  240 mg/dL   Triglycerides 16.1  0.0 - 149.0 mg/dL   Comment: Normal:  <096 mg/dLBorderline High:  150 -  199 mg/dL   HDL 04.54  >09.81 mg/dL   VLDL 19.1  0.0 - 47.8 mg/dL   Total CHOL/HDL Ratio 3     Comment:                Men          Women1/2 Average Risk     3.4          3.3Average Risk          5.0          4.42X Average Risk          9.6          7.13X Average Risk          15.0          11.0                      HEMOGLOBIN AND HEMATOCRIT, BLOOD     Status: None   Collection Time    01/27/13  8:16 AM      Result Value Range   Hemoglobin 12.6  12.0 - 15.0 g/dL   HCT 29.5  62.1 - 30.8 %  LDL CHOLESTEROL, DIRECT     Status: None   Collection Time    01/27/13  8:16 AM      Result Value Range   Direct LDL 115.2     Comment: Optimal:  <100 mg/dLNear or Above Optimal:  100-129 mg/dLBorderline High:  130-159 mg/dLHigh:  160-189 mg/dLVery High:  >190 mg/dL         Assessment & Plan:

## 2013-01-26 NOTE — Patient Instructions (Signed)
Happy Holidays  Health maintenance - current with gyn, colon and breast cancer screening. Immunization is up to date.   Weight management: Diet management: smart food choices, PORTION SIZE CONTROL, regular exercise. Goal - to loose 1-2 lbs.month. Target weight - 220 lbs. Slow but steady-set a realistic goal as above. Do not loose heart. This is an important issue in regard to the next 20 years.   Routine labs today and results will be posted to MyChart.

## 2013-01-27 ENCOUNTER — Other Ambulatory Visit (INDEPENDENT_AMBULATORY_CARE_PROVIDER_SITE_OTHER): Payer: 59

## 2013-01-27 DIAGNOSIS — Z0000 Encounter for general adult medical examination without abnormal findings: Secondary | ICD-10-CM

## 2013-01-27 DIAGNOSIS — D509 Iron deficiency anemia, unspecified: Secondary | ICD-10-CM

## 2013-01-27 LAB — LIPID PANEL
Cholesterol: 201 mg/dL — ABNORMAL HIGH (ref 0–200)
VLDL: 19.4 mg/dL (ref 0.0–40.0)

## 2013-01-27 LAB — TSH: TSH: 1.08 u[IU]/mL (ref 0.35–5.50)

## 2013-01-27 LAB — HEPATIC FUNCTION PANEL
ALT: 18 U/L (ref 0–35)
Albumin: 4.3 g/dL (ref 3.5–5.2)
Alkaline Phosphatase: 98 U/L (ref 39–117)
Total Protein: 7.8 g/dL (ref 6.0–8.3)

## 2013-01-27 LAB — BASIC METABOLIC PANEL
CO2: 26 mEq/L (ref 19–32)
Calcium: 9.3 mg/dL (ref 8.4–10.5)
Chloride: 104 mEq/L (ref 96–112)
Creatinine, Ser: 0.8 mg/dL (ref 0.4–1.2)
GFR: 78.98 mL/min (ref >60.00)
Sodium: 138 mEq/L (ref 135–145)

## 2013-01-27 LAB — HEMOGLOBIN AND HEMATOCRIT, BLOOD: HCT: 37.7 % (ref 36.0–46.0)

## 2013-01-27 LAB — LDL CHOLESTEROL, DIRECT: Direct LDL: 115.2 mg/dL

## 2013-01-30 NOTE — Assessment & Plan Note (Signed)
No c/o hypersomnolence. Is not using any device.  Plan For symptoms she would be a good candidate for an oral appliance

## 2013-01-30 NOTE — Assessment & Plan Note (Signed)
Interval history is unremarkable. Physical exam, sans breast and pelvic - deferred to gyn, notable for obesity but otherwise ok. Lab reviewed: LDL is ok at 115 and LDL/HDL ration is <2!Marland Kitchen She is current with gyn and colorectal cancer screening. Immunizations are up to date.   In summary A wonderful coworker who is doing ok but really needs to address obesity. She will contact us if she wants referral to bariatric surgery program.

## 2013-01-30 NOTE — Assessment & Plan Note (Addendum)
Body mass index is 41.96 kg/(m^2).  Emphasized the importance of addressing this problem. Tried to give positive attitude and dispel her discouragement about lack of success.  Plan Diet management: smart food choices, PORTION SIZE CONTROL, regular exercise. Goal - to loose 1-2 lbs.month. Target weight - 180lb  She is encouraged to consider Bariatric surgery - lap band.

## 2013-02-01 ENCOUNTER — Telehealth: Payer: Self-pay | Admitting: Internal Medicine

## 2013-02-01 MED ORDER — CYCLOBENZAPRINE HCL 5 MG PO TABS: 5.0000 mg | ORAL_TABLET | Freq: Three times a day (TID) | ORAL | Status: DC

## 2013-02-01 NOTE — Telephone Encounter (Signed)
Pulled a muscle this weekend doing yard work.  Would like to have a muscle relaxer called in to [facility] or an appt.

## 2013-02-01 NOTE — Telephone Encounter (Signed)
Ok for flexeril 5 mg tid x 7 days - 212 tabs.

## 2013-02-01 NOTE — Telephone Encounter (Signed)
rx has been sent 

## 2013-02-03 ENCOUNTER — Telehealth: Payer: Self-pay | Admitting: Internal Medicine

## 2013-02-03 DIAGNOSIS — M8580 Other specified disorders of bone density and structure, unspecified site: Secondary | ICD-10-CM

## 2013-02-03 NOTE — Telephone Encounter (Signed)
Pt would like an order for a Dexa to be done here.

## 2013-02-05 ENCOUNTER — Ambulatory Visit (INDEPENDENT_AMBULATORY_CARE_PROVIDER_SITE_OTHER)
Admission: RE | Admit: 2013-02-05 | Discharge: 2013-02-05 | Disposition: A | Discharge status: Home / Self Care | Payer: 59 | Source: Ambulatory Visit | Attending: Internal Medicine | Admitting: Internal Medicine

## 2013-02-05 DIAGNOSIS — M899 Disorder of bone, unspecified: Secondary | ICD-10-CM

## 2013-02-05 DIAGNOSIS — M949 Disorder of cartilage, unspecified: Secondary | ICD-10-CM

## 2013-02-05 DIAGNOSIS — M8580 Other specified disorders of bone density and structure, unspecified site: Secondary | ICD-10-CM

## 2013-02-05 NOTE — Telephone Encounter (Signed)
Pt is aware and scheduled.

## 2013-02-05 NOTE — Telephone Encounter (Signed)
Order in, ok to schedule

## 2013-02-08 ENCOUNTER — Encounter: Payer: Self-pay | Admitting: Internal Medicine

## 2013-02-11 ENCOUNTER — Ambulatory Visit (INDEPENDENT_AMBULATORY_CARE_PROVIDER_SITE_OTHER): Payer: 59 | Admitting: Gynecology

## 2013-02-11 ENCOUNTER — Encounter: Payer: Self-pay | Admitting: Gynecology

## 2013-02-11 VITALS — BP 130/86

## 2013-02-11 DIAGNOSIS — R6882 Decreased libido: Secondary | ICD-10-CM

## 2013-02-11 DIAGNOSIS — Z8041 Family history of malignant neoplasm of ovary: Secondary | ICD-10-CM | POA: Insufficient documentation

## 2013-02-11 DIAGNOSIS — E663 Overweight: Secondary | ICD-10-CM | POA: Insufficient documentation

## 2013-02-11 DIAGNOSIS — N951 Menopausal and female climacteric states: Secondary | ICD-10-CM

## 2013-02-11 NOTE — Progress Notes (Signed)
   Patient is a 53 year old who was seen in the office as a new patient on 01/25/2013. See previous encounter note for detail past medical history. Patient with past history of condyloma acuminata with. Patient has been dealing with weight issues for years. Patient also has had history in the past of cervical dysplasia. Patient presented to the office today to further discuss some of her menopausal symptoms. She states that her hot flashes or not that frequent. She has been menopausal for 2 years. The first year of her menopause she had been started on estrogen patch by another provider but discontinued it after a year. She stated that she was having irregular bleeding while on the estrogen patch. For the past year she has not been using any form of hormone and denies any vaginal bleeding.  She is not married but has been in a relationship on and off since 1999 with same partner. There appears to be issues in the relationship that she needs to iron out that may be contributing to her libido as well as her self-image because of her weight. She currently weighs 268 pounds (BMI 48.63). She stated that a few years ago she was in the process of proceeding with bariatric surgery to help with her weight but did not follow through.  Patient's mother died last year for peritoneal carcinomatosis primary source not determine suspicious for ovarian in nature. Patient's mother prior to that many years ago had a hysterectomy with ovarian conservation.  The patient recently had a serum testosterone level that was low normal at 17 normal being 10-70). We discussed the following options:  #1. Start oral hormone replacement therapy with methyltestosterone to help with libido and her hot flashes but would need to add progestational agent for 10-12 days of the month to protect her endometrium. Because of her weight there could be a possibility she could gain or weight and this was addressed today. We also discussed the  potential risk of DVT and pulmonary embolism and also the risk of breast cancer. We discussed the women's health initiative study as well. #2 we discussed off label treatment with Viagra starting at a low dose of 10 mg daily to help with arousal and orgasmic response. It's risks benefits and pros and cons were discussed as well. #3 patient was offered and would like to proceed this route which would be to set up an appointment with a general surgeon for bariatric surgery to help with her weight. We discussed healthy diet and regular exercise. She will also need to make changes in her life as to her difficult relationship with her partner which may in itself be contributing to her decreased desire and frequency of sexual activity. #4 she would be offered referral to a psychologist/psychiatrist if she would like.  Time of consultation 30 minutes.

## 2013-02-18 ENCOUNTER — Telehealth: Payer: Self-pay | Admitting: Internal Medicine

## 2013-02-18 NOTE — Telephone Encounter (Signed)
Ok Thx 

## 2013-02-18 NOTE — Telephone Encounter (Signed)
Suzanne Schaefer would like to know if Dr Alain Marion will take her as a patient when Dr. Benson [place].  Suzanne Schaefer works in [employer] here.

## 2013-02-18 NOTE — Telephone Encounter (Signed)
Pt is aware.  

## 2013-02-22 ENCOUNTER — Ambulatory Visit (INDEPENDENT_AMBULATORY_CARE_PROVIDER_SITE_OTHER): Payer: 59 | Admitting: Internal Medicine

## 2013-02-22 ENCOUNTER — Encounter: Payer: Self-pay | Admitting: Internal Medicine

## 2013-02-22 VITALS — BP 140/90 | HR 81 | Temp 97.7°F | Wt 269.0 lb

## 2013-02-22 DIAGNOSIS — M25572 Pain in left ankle and joints of left foot: Secondary | ICD-10-CM

## 2013-02-22 DIAGNOSIS — M545 Low back pain, unspecified: Secondary | ICD-10-CM

## 2013-02-22 DIAGNOSIS — M25579 Pain in unspecified ankle and joints of unspecified foot: Secondary | ICD-10-CM

## 2013-02-22 NOTE — Progress Notes (Signed)
Pre visit review using our clinic review tool, if applicable. No additional management support is needed unless otherwise documented below in the visit note. 

## 2013-02-22 NOTE — Patient Instructions (Signed)
1. Low back pain - there is great tenderness at the sacro-iliac joint on the right suggesting an inflammation at this joint as the muscles in the region. Plan Rub of choice - aspercreme, etc.  OTC NSAID - e.g. Aleve  2. Foot/ankle pain. Question of positional related inflammation vs nerve pain. Plan  Same as above.

## 2013-02-22 NOTE — Progress Notes (Signed)
   Subjective:    Patient ID: Suzanne Schaefer, female    DOB: 01/25/1961, 53 y.o.   MRN: [id number]  HPI Recently seen for low back pain. Now with pain at the lateral low back right. NO radiation of pain to the leg, no weakness or other symptoms. No loss of sensation.  She also has pain the left ankle laterallly just below the lateral malleous.  PMH, FamHx and SocHx reviewed for any changes and relevance. Current Outpatient Prescriptions on File Prior to Visit  Medication Sig Dispense Refill  . Calcium-Magnesium-Vitamin D (CITRACAL CALCIUM+D PO) Take 2 capsules by mouth.      . cholecalciferol (VITAMIN D) 1000 UNITS tablet Take 2,000 Units by mouth daily.      . cyclobenzaprine (FLEXERIL) 5 MG tablet Take 1 tablet (5 mg total) by mouth 3 (three) times daily.  21 tablet  0  . eszopiclone (LUNESTA) 2 MG TABS tablet Take 1 tablet (2 mg total) by mouth at bedtime as needed for sleep. Take immediately before bedtime  30 tablet  2  . Flaxseed, Linseed, (FLAXSEED OIL) 1000 MG CAPS Take 3 capsules by mouth 2 (two) times daily.        . Glucosamine-Chondroitin 1500-1200 MG/30ML LIQD Take 1 tablet by mouth 3 (three) times daily.        . pantoprazole (PROTONIX) 40 MG tablet Take 1 tablet (40 mg total) by mouth daily.  90 tablet  3  . Polyethylene Glycol 3350 (MIRALAX PO) Take by mouth.      Marland Kitchen rOPINIRole (REQUIP) 0.25 MG tablet Take 3 tablets (0.75 mg total) by mouth at bedtime.  270 tablet  3   No current facility-administered medications on file prior to visit.      Review of Systems Constitutional:  Negative for fever, chills, activity change and unexpected weight change.  HEENT:  Negative for hearing loss, ear pain, congestion, neck stiffness and postnasal drip. Negative for sore throat or swallowing problems. Negative for dental complaints.   Eyes: Negative for vision loss or change in visual acuity.  Respiratory: Negative for chest tightness and wheezing. Negative for DOE.   Cardiovascular:  Negative for chest pain or palpitations. No decreased exercise tolerance Gastrointestinal: No change in bowel habit. No bloating or gas. No reflux or indigestion Genitourinary: Negative for urgency, frequency, flank pain and difficulty urinating.  Musculoskeletal: Negative for myalgias, back pain, arthralgias and gait problem.  Neurological: Negative for dizziness, tremors, weakness and headaches.  Hematological: Negative for adenopathy.  Psychiatric/Behavioral: Negative for behavioral problems and dysphoric mood.        Objective:   Physical Exam Filed Vitals:   02/22/13 0830  BP: 140/90  Pulse: 81  Temp: 97.7 F (36.5 C)   Wt Readings from Last 3 Encounters:  02/22/13 269 lb (122.018 kg)  01/26/13 236 lb 12.8 oz (107.412 kg)  01/25/13 268 lb (121.564 kg)   Gen'l- overweight woman in no distress MSK - very tender over the right SI joint. Normal gait. Tender to palpation just below the left lateral malleoulus.        Assessment & Plan:  1. Low back pain - there is great tenderness at the sacro-iliac joint on the right suggesting an inflammation at this joint as the muscles in the region. Plan Rub of choice - aspercreme, etc.  OTC NSAID - e.g. Aleve  2. Foot/ankle pain. Question of positional related inflammation vs nerve pain. Plan  Same as above.

## 2013-04-02 ENCOUNTER — Other Ambulatory Visit (INDEPENDENT_AMBULATORY_CARE_PROVIDER_SITE_OTHER): Payer: 59

## 2013-04-02 ENCOUNTER — Encounter: Payer: Self-pay | Admitting: Family Medicine

## 2013-04-02 ENCOUNTER — Ambulatory Visit (INDEPENDENT_AMBULATORY_CARE_PROVIDER_SITE_OTHER): Payer: 59 | Admitting: Family Medicine

## 2013-04-02 VITALS — BP 134/78 | HR 78 | Temp 97.8°F | Resp 16 | Wt 269.8 lb

## 2013-04-02 DIAGNOSIS — S92309A Fracture of unspecified metatarsal bone(s), unspecified foot, initial encounter for closed fracture: Secondary | ICD-10-CM

## 2013-04-02 DIAGNOSIS — M79671 Pain in right foot: Secondary | ICD-10-CM

## 2013-04-02 DIAGNOSIS — M79609 Pain in unspecified limb: Secondary | ICD-10-CM

## 2013-04-02 DIAGNOSIS — S92353A Displaced fracture of fifth metatarsal bone, unspecified foot, initial encounter for closed fracture: Secondary | ICD-10-CM

## 2013-04-02 NOTE — Patient Instructions (Signed)
It is good to see you Continue the vitamin D Tylenol 5650 nmg three times a day can help Ice bath of foot 15 minutes 2 times a day Wear the post op boot of your daughters next 2 weeks Come back in 3 weeks.

## 2013-04-02 NOTE — Progress Notes (Signed)
  [name] [address], [place] [address] Phone: [phone] Subjective:     CC: Right foot pain  YIF:OYDXAJOINO MIAKODA Suzanne Schaefer is a 53 y.o. female coming in with complaint of right foot pain. Patient states she was attempting to beat the snow off of a bush and rotated her ankle causing her to fall. Patient did not have any ankle pain this started having pain on the lateral aspect. Patient states that over the course of the day it started to hurt more and more. This occurred yesterday. Patient denies any swelling or any discoloration. Patient went to the pain over the fifth metatarsal mostly on the lateral aspect. Patient denies any numbness or tingling. Patient rates the pain 6/10. Has had a difficult to ambulate.     Past medical history, social, surgical and family history all reviewed in electronic medical record.   Review of Systems: No headache, visual changes, nausea, vomiting, diarrhea, constipation, dizziness, abdominal pain, skin rash, fevers, chills, night sweats, weight loss, swollen lymph nodes, body aches, joint swelling, muscle aches, chest pain, shortness of breath, mood changes.   Objective Blood pressure 134/78, pulse 78, temperature 97.8 F (36.6 C), temperature source Oral, resp. rate 16, weight 269 lb 12.8 oz (122.38 kg), SpO2 96.00%.  General: No apparent distress alert and oriented x3 mood and affect normal, dressed appropriately. Obese HEENT: Pupils equal, extraocular movements intact  Respiratory: Patient's speak in full sentences and does not appear short of breath  Cardiovascular: No lower extremity edema, non tender, no erythema  Skin: Warm dry intact with no signs of infection or rash on extremities or on axial skeleton.  Abdomen: Soft nontender  Neuro: Cranial nerves II through XII are intact, neurovascularly intact in all extremities with 2+ DTRs and 2+ pulses.  Lymph: No lymphadenopathy of posterior or anterior  cervical chain or axillae bilaterally.  Gait antalgic gait with external rotation of the right leg MSK:  Non tender with full range of motion and good stability and symmetric strength and tone of shoulders, elbows, wrist, hip, knee and ankles bilaterally.  Right foot exam shows the patient is tender to palpation over the fifth metatarsal just distal to the MP joint. There is no swelling noted. Positive compression pain. Ankle has full range of motion and neurovascularly intact distally.  Limited musculoskeletal ultrasound was performed and interpreted by me today. Patient though does have a interruption in the cortical portion of the bone approximately 0.5 cm from MP joint of the fifth metatarsal. There is increasing Doppler flow and some mild hypoechoic changes. This seems to be mostly nondisplaced.  Impression: Fifth metatarsal fracture.    Impression and Recommendations:     This case required medical decision making of moderate complexity.

## 2013-04-02 NOTE — Assessment & Plan Note (Signed)
Discussed with patient at great length Patient will wear a postop boot while in pain. Patient has one at home she states. Discussed icing: Tylenol for pain relief Patient come back in 3 weeks she will ultrasound area again to make sure that we have good callus formation.

## 2013-04-02 NOTE — Progress Notes (Signed)
Pre visit review using our clinic review tool, if applicable. No additional management support is needed unless otherwise documented below in the visit note. 

## 2013-04-21 ENCOUNTER — Encounter: Payer: Self-pay | Admitting: Internal Medicine

## 2013-04-23 ENCOUNTER — Other Ambulatory Visit (INDEPENDENT_AMBULATORY_CARE_PROVIDER_SITE_OTHER): Payer: 59

## 2013-04-23 ENCOUNTER — Ambulatory Visit (INDEPENDENT_AMBULATORY_CARE_PROVIDER_SITE_OTHER): Payer: 59 | Admitting: Family Medicine

## 2013-04-23 ENCOUNTER — Encounter: Payer: Self-pay | Admitting: Family Medicine

## 2013-04-23 VITALS — BP 142/84 | HR 76

## 2013-04-23 DIAGNOSIS — S92353A Displaced fracture of fifth metatarsal bone, unspecified foot, initial encounter for closed fracture: Secondary | ICD-10-CM

## 2013-04-23 DIAGNOSIS — S92309A Fracture of unspecified metatarsal bone(s), unspecified foot, initial encounter for closed fracture: Secondary | ICD-10-CM

## 2013-04-23 NOTE — Assessment & Plan Note (Signed)
Patient does have a soft callus formation on ultrasound today. Patient can do exercises was given to her today we discussed wearing proper shoes. Patient will continue with the icing regimen and come back again in 3 weeks if still having pain. Otherwise patient to followup at another time and remained would consider custom orthotics secondary to patient's back pain foot breakdown and body habitus.

## 2013-04-23 NOTE — Progress Notes (Signed)
  [name] [address], [place] [address] Phone: [phone] Subjective:     CC: Right foot pain follow up  [id number] Suzanne Schaefer is a 53 y.o. female coming in for followup of her right foot pain. Patient previously seen and had a right fifth metatarsal fracture that was extra-articular. Patient has been able to change back into a shoe it is angulating. Patient denies any numbness. Patient states that she does have soreness at the end of a long day but overall does not have the sharp pain that she was having previously. Patient denies any swelling and states that she does not have any nighttime awakening. No new symptoms.    Past medical history, social, surgical and family history all reviewed in electronic medical record.   Review of Systems: No headache, visual changes, nausea, vomiting, diarrhea, constipation, dizziness, abdominal pain, skin rash, fevers, chills, night sweats, weight loss, swollen lymph nodes, body aches, joint swelling, muscle aches, chest pain, shortness of breath, mood changes.   Objective Blood pressure 142/84, pulse 76, SpO2 99.00%.  General: No apparent distress alert and oriented x3 mood and affect normal, dressed appropriately. Obese HEENT: Pupils equal, extraocular movements intact  Respiratory: Patient's speak in full sentences and does not appear short of breath  Cardiovascular: No lower extremity edema, non tender, no erythema  Skin: Warm dry intact with no signs of infection or rash on extremities or on axial skeleton.  Abdomen: Soft nontender  Neuro: Cranial nerves II through XII are intact, neurovascularly intact in all extremities with 2+ DTRs and 2+ pulses.  Lymph: No lymphadenopathy of posterior or anterior cervical chain or axillae bilaterally.  Gait antalgic gait with external rotation of the right leg MSK:  Non tender with full range of motion and good stability and symmetric strength and tone of  shoulders, elbows, wrist, hip, knee and ankles bilaterally.  Right foot exam shows the patient is tender to palpation over the fifth metatarsal just distal to the MP joint but improved from previous exam. There is no swelling noted. Ankle has full range of motion and neurovascularly intact distally.  Limited musculoskeletal ultrasound was performed and interpreted by me today. Patient though does have a interruption in the cortical portion of the bone approximately 0.5 cm from MP joint of the fifth metatarsal. Patient does have a soft callus formation at this time with significant decrease in hypoechoic changes from previous exam .  Impression: Fifth metatarsal fracture soft callus formation.     Impression and Recommendations:     This case required medical decision making of moderate complexity.  Spent greater than 25 minutes with patient face-to-face and had greater than 50% of counseling including as described above in assessment and plan.

## 2013-04-23 NOTE — Patient Instructions (Signed)
Very good to see you You are doing great Baby the foot for another 3 weeks.  If not perfect in 3 weeks see me again.  If perfect and want to try orthotics please call and make a 30 minute appointment.

## 2013-05-10 ENCOUNTER — Telehealth: Payer: Self-pay | Admitting: *Deleted

## 2013-05-10 NOTE — Telephone Encounter (Signed)
Pt c/o LLQ abd pain x 4 days. No other symptoms. Ov scheduled for 05/13/13 at 11:00.

## 2013-05-13 ENCOUNTER — Other Ambulatory Visit (INDEPENDENT_AMBULATORY_CARE_PROVIDER_SITE_OTHER): Payer: 59

## 2013-05-13 ENCOUNTER — Encounter: Payer: Self-pay | Admitting: Internal Medicine

## 2013-05-13 ENCOUNTER — Ambulatory Visit (INDEPENDENT_AMBULATORY_CARE_PROVIDER_SITE_OTHER): Payer: 59 | Admitting: Internal Medicine

## 2013-05-13 VITALS — BP 132/74 | HR 82 | Temp 98.7°F | Resp 16 | Ht 63.0 in | Wt 275.0 lb

## 2013-05-13 DIAGNOSIS — R1032 Left lower quadrant pain: Secondary | ICD-10-CM

## 2013-05-13 LAB — CBC WITH DIFFERENTIAL/PLATELET
BASOS PCT: 0.5 % (ref 0.0–3.0)
Basophils Absolute: 0 10*3/uL (ref 0.0–0.1)
EOS PCT: 3.2 % (ref 0.0–5.0)
Eosinophils Absolute: 0.2 10*3/uL (ref 0.0–0.7)
HCT: 35.5 % — ABNORMAL LOW (ref 36.0–46.0)
Hemoglobin: 12.1 g/dL (ref 12.0–15.0)
LYMPHS PCT: 33.4 % (ref 12.0–46.0)
Lymphs Abs: 2.1 10*3/uL (ref 0.7–4.0)
MCHC: 33.9 g/dL (ref 30.0–36.0)
MCV: 80.7 fl (ref 78.0–100.0)
Monocytes Absolute: 0.5 10*3/uL (ref 0.1–1.0)
Monocytes Relative: 7.3 % (ref 3.0–12.0)
NEUTROS PCT: 55.6 % (ref 43.0–77.0)
Neutro Abs: 3.5 10*3/uL (ref 1.4–7.7)
Platelets: 302 10*3/uL (ref 150.0–400.0)
RBC: 4.4 Mil/uL (ref 3.87–5.11)
RDW: 15 % — ABNORMAL HIGH (ref 11.5–14.6)
WBC: 6.3 10*3/uL (ref 4.5–10.5)

## 2013-05-13 LAB — HEPATIC FUNCTION PANEL
ALT: 18 U/L (ref 0–35)
AST: 39 U/L — ABNORMAL HIGH (ref 0–37)
Albumin: 3.7 g/dL (ref 3.5–5.2)
Alkaline Phosphatase: 107 U/L (ref 39–117)
BILIRUBIN TOTAL: 0.2 mg/dL — AB (ref 0.3–1.2)
Bilirubin, Direct: 0 mg/dL (ref 0.0–0.3)
TOTAL PROTEIN: 7.6 g/dL (ref 6.0–8.3)

## 2013-05-13 LAB — URINALYSIS
Bilirubin Urine: NEGATIVE
HGB URINE DIPSTICK: NEGATIVE
Ketones, ur: NEGATIVE
Leukocytes, UA: NEGATIVE
NITRITE: NEGATIVE
Specific Gravity, Urine: 1.02 (ref 1.000–1.030)
TOTAL PROTEIN, URINE-UPE24: NEGATIVE
Urine Glucose: NEGATIVE
Urobilinogen, UA: 0.2 (ref 0.0–1.0)
pH: 6 (ref 5.0–8.0)

## 2013-05-13 LAB — BASIC METABOLIC PANEL
BUN: 16 mg/dL (ref 6–23)
CHLORIDE: 102 meq/L (ref 96–112)
CO2: 28 meq/L (ref 19–32)
CREATININE: 0.7 mg/dL (ref 0.4–1.2)
Calcium: 9.4 mg/dL (ref 8.4–10.5)
GFR: 91.85 mL/min (ref >60.00)
Glucose, Bld: 91 mg/dL (ref 70–99)
Potassium: 4.3 mEq/L (ref 3.5–5.1)
Sodium: 138 mEq/L (ref 135–145)

## 2013-05-13 LAB — SEDIMENTATION RATE: Sed Rate: 49 mm/hr — ABNORMAL HIGH (ref 0–22)

## 2013-05-13 MED ORDER — CIPROFLOXACIN HCL 500 MG PO TABS: 500.0000 mg | ORAL_TABLET | Freq: Two times a day (BID) | ORAL | Status: DC

## 2013-05-13 NOTE — Patient Instructions (Signed)
Low residue diet x 10 days 

## 2013-05-13 NOTE — Progress Notes (Signed)
Pre visit review using our clinic review tool, if applicable. No additional management support is needed unless otherwise documented below in the visit note. 

## 2013-05-13 NOTE — Progress Notes (Signed)
   Subjective:    Patient ID: Suzanne Schaefer, female    DOB: 07/21/1960, 53 y.o.   MRN: [id number]  Abdominal Pain This is a new problem. The current episode started in the past 7 days. The problem has been gradually improving. The pain is located in the LLQ. Pertinent negatives include no frequency, headaches or nausea.   BP Readings from Last 3 Encounters:  05/13/13 132/74  04/23/13 142/84  04/02/13 134/78   Wt Readings from Last 3 Encounters:  05/13/13 275 lb (124.739 kg)  04/02/13 269 lb 12.8 oz (122.38 kg)  02/22/13 269 lb (122.018 kg)    Review of Systems  Constitutional: Negative for chills, activity change, appetite change, fatigue and unexpected weight change.  HENT: Negative for congestion, mouth sores and sinus pressure.   Eyes: Negative for visual disturbance.  Respiratory: Negative for cough and chest tightness.   Gastrointestinal: Positive for abdominal pain. Negative for nausea.  Genitourinary: Positive for pelvic pain. Negative for urgency, frequency, vaginal bleeding, vaginal discharge, difficulty urinating and vaginal pain.  Musculoskeletal: Negative for back pain and gait problem.  Skin: Negative for pallor and rash.  Neurological: Negative for dizziness, tremors, weakness, numbness and headaches.  Psychiatric/Behavioral: Negative for confusion and sleep disturbance.       Objective:   Physical Exam  Constitutional: She appears well-developed. No distress.  Obese  NAD  HENT:  Head: Normocephalic.  Right Ear: External ear normal.  Left Ear: External ear normal.  Nose: Nose normal.  Mouth/Throat: Oropharynx is clear and moist.  Eyes: Conjunctivae are normal. Pupils are equal, round, and reactive to light. Right eye exhibits no discharge. Left eye exhibits no discharge.  Neck: Normal range of motion. Neck supple. No JVD present. No tracheal deviation present. No thyromegaly present.  Cardiovascular: Normal rate, regular rhythm and normal heart sounds.     Pulmonary/Chest: No stridor. No respiratory distress. She has no wheezes.  Abdominal: Soft. Bowel sounds are normal. She exhibits no distension and no mass. There is no tenderness. There is no rebound and no guarding.  Musculoskeletal: She exhibits no edema and no tenderness.  Lymphadenopathy:    She has no cervical adenopathy.  Neurological: She displays normal reflexes. No cranial nerve deficit. She exhibits normal muscle tone. Coordination normal.  Skin: No rash noted. No erythema.  Psychiatric: She has a normal mood and affect. Her behavior is normal. Judgment and thought content normal.   Lab Results  Component Value Date   WBC 6.3 05/13/2013   HGB 12.1 05/13/2013   HCT 35.5* 05/13/2013   PLT 302.0 05/13/2013   GLUCOSE 91 05/13/2013   CHOL 201* 01/27/2013   TRIG 97.0 01/27/2013   HDL 71.80 01/27/2013   LDLDIRECT 115.2 01/27/2013   LDLCALC 108* 01/18/2011   ALT 18 05/13/2013   AST 39* 05/13/2013   NA 138 05/13/2013   K 4.3 05/13/2013   CL 102 05/13/2013   CREATININE 0.7 05/13/2013   BUN 16 05/13/2013   CO2 28 05/13/2013   TSH 1.08 01/27/2013   HGBA1C 5.9 01/10/2009          Assessment & Plan:

## 2013-05-17 ENCOUNTER — Telehealth: Payer: Self-pay | Admitting: Internal Medicine

## 2013-05-17 ENCOUNTER — Encounter: Payer: Self-pay | Admitting: Internal Medicine

## 2013-05-17 DIAGNOSIS — R1032 Left lower quadrant pain: Secondary | ICD-10-CM

## 2013-05-17 DIAGNOSIS — R509 Fever, unspecified: Secondary | ICD-10-CM

## 2013-05-17 MED ORDER — AMOXICILLIN-POT CLAVULANATE 875-125 MG PO TABS: 1.0000 | ORAL_TABLET | Freq: Two times a day (BID) | ORAL | Status: DC

## 2013-05-17 NOTE — Telephone Encounter (Signed)
I emailed Augmentin to her pharmacy and ordered a CT scan. I did not have her mychart message in my mailbox for some reason... AP

## 2013-05-17 NOTE — Telephone Encounter (Signed)
Pt was seen Thursday.  Sat. She had fever and abd pain.  After taking 1 cipro she vomited and had diarrhea.  She only took 2 on Sat. And stopped taking it.  What else can she use or do?  A different medication or a CT?

## 2013-05-17 NOTE — Telephone Encounter (Signed)
I'll email Augmentin to pharmacy and order a CT scan.  I did not have this mychart message in my mailbox for some reason... AP

## 2013-05-20 ENCOUNTER — Other Ambulatory Visit: Payer: Self-pay | Admitting: Internal Medicine

## 2013-05-20 ENCOUNTER — Ambulatory Visit (INDEPENDENT_AMBULATORY_CARE_PROVIDER_SITE_OTHER)
Admission: RE | Admit: 2013-05-20 | Discharge: 2013-05-20 | Disposition: A | Discharge status: Home / Self Care | Payer: 59 | Source: Ambulatory Visit | Attending: Internal Medicine | Admitting: Internal Medicine

## 2013-05-20 DIAGNOSIS — R509 Fever, unspecified: Secondary | ICD-10-CM

## 2013-05-20 DIAGNOSIS — N83202 Unspecified ovarian cyst, left side: Secondary | ICD-10-CM

## 2013-05-20 DIAGNOSIS — R1032 Left lower quadrant pain: Secondary | ICD-10-CM

## 2013-05-20 MED ORDER — IOHEXOL 300 MG/ML  SOLN
100.0000 mL | Freq: Once | INTRAMUSCULAR | Status: AC | PRN
  Administered 2013-05-20: 100 mL via INTRAVENOUS

## 2013-05-21 ENCOUNTER — Telehealth: Payer: Self-pay | Admitting: *Deleted

## 2013-05-21 DIAGNOSIS — N83209 Unspecified ovarian cyst, unspecified side: Secondary | ICD-10-CM

## 2013-05-21 NOTE — Telephone Encounter (Signed)
Past patient did get a CA 125 drawn an ultrasound same day as office visit.

## 2013-05-21 NOTE — Telephone Encounter (Signed)
Suzanne Schaefer will inform patient with the below, order placed.

## 2013-05-21 NOTE — Telephone Encounter (Signed)
Message copied by Thamas Jaegers on Fri May 21, 2013  2:10 PM ------      Message from: Sinclair Grooms      Created: Fri May 21, 2013  9:33 AM      Regarding: ultrasound order       Dr Alain Marion is referring patient back to [place] for ovarian cyst.  Can you ask JF if he wants to do an ultrasound the day of the visit.  [place] notes are in epic. Thx ------

## 2013-05-21 NOTE — Telephone Encounter (Signed)
SEE BELOW NOTE

## 2013-05-23 ENCOUNTER — Encounter: Payer: Self-pay | Admitting: Internal Medicine

## 2013-05-23 DIAGNOSIS — R1032 Left lower quadrant pain: Secondary | ICD-10-CM | POA: Insufficient documentation

## 2013-05-23 NOTE — Assessment & Plan Note (Signed)
Continue with current prescription therapy as reflected on the Med list.  

## 2013-06-02 ENCOUNTER — Other Ambulatory Visit: Payer: Self-pay | Admitting: Gynecology

## 2013-06-02 DIAGNOSIS — N83209 Unspecified ovarian cyst, unspecified side: Secondary | ICD-10-CM

## 2013-06-09 ENCOUNTER — Ambulatory Visit (INDEPENDENT_AMBULATORY_CARE_PROVIDER_SITE_OTHER): Payer: 59 | Admitting: Gynecology

## 2013-06-09 ENCOUNTER — Ambulatory Visit (INDEPENDENT_AMBULATORY_CARE_PROVIDER_SITE_OTHER): Payer: 59

## 2013-06-09 ENCOUNTER — Other Ambulatory Visit: Payer: Self-pay | Admitting: Gynecology

## 2013-06-09 ENCOUNTER — Encounter: Payer: Self-pay | Admitting: Gynecology

## 2013-06-09 ENCOUNTER — Telehealth: Payer: Self-pay

## 2013-06-09 VITALS — BP 134/88

## 2013-06-09 DIAGNOSIS — N83202 Unspecified ovarian cyst, left side: Secondary | ICD-10-CM

## 2013-06-09 DIAGNOSIS — N83209 Unspecified ovarian cyst, unspecified side: Secondary | ICD-10-CM

## 2013-06-09 DIAGNOSIS — N888 Other specified noninflammatory disorders of cervix uteri: Secondary | ICD-10-CM

## 2013-06-09 DIAGNOSIS — N949 Unspecified condition associated with female genital organs and menstrual cycle: Secondary | ICD-10-CM

## 2013-06-09 DIAGNOSIS — R102 Pelvic and perineal pain: Secondary | ICD-10-CM

## 2013-06-09 DIAGNOSIS — N72 Inflammatory disease of cervix uteri: Secondary | ICD-10-CM

## 2013-06-09 MED ORDER — MEDROXYPROGESTERONE ACETATE 150 MG/ML IM SUSP
150.0000 mg | Freq: Once | INTRAMUSCULAR | Status: AC
  Administered 2013-06-09: 150 mg via INTRAMUSCULAR

## 2013-06-09 NOTE — Telephone Encounter (Signed)
Emailed this response back to patient.

## 2013-06-09 NOTE — Progress Notes (Signed)
   Suzanne Schaefer presented to the office today for followup ultrasound. The Suzanne Schaefer's PCP had ordered an abdominal pelvic CT scan as a result of abdominal pain and fever back on 05/20/2013. And the following was documented the radiologist:  There is a 3.3 x 2.4 cm mass arising from the left ovary which  cannot be classified as a simple cyst. It may represent a small  hemorrhagic cyst. Differential considerations, however, must include  endometrioma or early ovarian cystic neoplasm. Correlation with  pelvic ultrasound may be advisable to further assess.  Small mesenteric and right lower quadrant lymph nodes are considered  nonspecific. In the appropriate clinical setting, a degree of  mesenteric adenitis is possible. By size criteria, there is no  adenopathy in the abdomen or pelvis.  Appendix appears normal. No bowel obstruction. No abscess. Note that  there is fairly diffuse stool throughout the colon. No evidence of  diverticulitis. Gallbladder absent.   Suzanne Schaefer was asymptomatic today. Suzanne Schaefer currently on no hormone replacement therapy. Previously prescribed did not initiate.  Exam today: Abdomen: Soft nontender no rebound or guarding pelvic exam not done due to the fact that she had an ultrasound today with the following findings:  Uterus measures 6.7 x 5.1 x 3.5 mm with endometrial stripe of 5.3 mm. Right ovary was normal. The left ovary continued presence of a cystic mass avascular pedicle free measuring 2.3 x 1.5 x 1.4 mm average size 1.7 cm decreased in size from previous CT scan. No fluid in the cul-de-sac.  Assessment/plan: Suzanne Schaefer asymptomatic today last month had low, pain in the left lower quadrant was seen by her primary care physician and a CT scan was ordered and a 3 cm cyst was noted. On ultrasound today the cyst appears to have decreased in size by half measuring only 1.7 cm clear in appearance echo free no vascularity. A CA 125 was done today and she will receive Depo-Provera 150  mg IM today and to return to the office in 3 months for followup.  Suzanne Schaefer's mother died last year for peritoneal carcinomatosis primary source not determine suspicious for ovarian in nature. Suzanne Schaefer's mother prior to that many years ago had a hysterectomy with ovarian conservation.    I have provided the Suzanne Schaefer with a ligature formation on laparoscopy as well as ovarian cysts and on the CA 125 testing.

## 2013-06-09 NOTE — Telephone Encounter (Signed)
Because removing the ovaries laparoscopically but is minimally invasive surgery and cortical recovery instead of waiting 6 weeks to recover from a hysterectomy

## 2013-06-09 NOTE — Telephone Encounter (Signed)
Patient sent an email: "Honestly I didn't really remember all the options we talked about in January until you mentioned them today. If we decide to have the ovaries removed why should we leave the uterus. Just wondering."

## 2013-06-09 NOTE — Patient Instructions (Signed)
CA-125 Tumor Marker CA 125 is a tumor marker that is used to help monitor the course of ovarian or endometrial cancer. PREPARATION FOR TEST No preparation is necessary. NORMAL FINDINGS Adults: 0-35 units/mL (0-35 kilounits)/L Ranges for normal findings may vary among different laboratories and hospitals. You should always check with your doctor after having lab work or other tests done to discuss the meaning of your test results and whether your values are considered within normal limits. MEANING OF TEST  Your caregiver will go over the test results with you and discuss the importance and meaning of your results, as well as treatment options and the need for additional tests if necessary. OBTAINING THE TEST RESULTS It is your responsibility to obtain your test results. Ask the lab or department performing the test when and how you will get your results. Document Released: 02/13/2004 Document Revised: 04/15/2011 Document Reviewed: 12/30/2007 [facility] Patient Information 2014 [place], [place]. Diagnostic Laparoscopy Laparoscopy is a surgical procedure. It is used to diagnose and treat diseases inside the belly (abdomen). It is usually a brief, common, and relatively simple procedure. The laparoscopeis a thin, lighted, pencil-sized instrument. It is like a telescope. It is inserted into your abdomen through a small cut (incision). Your caregiver can look at the organs inside your body through this instrument. He or she can see if there is anything abnormal. Laparoscopy can be done either in a hospital or outpatient clinic. You may be given a mild sedative to help you relax before the procedure. Once in the operating room, you will be given a drug to make you sleep (general anesthesia). Laparoscopy usually lasts less than 1 hour. After the procedure, you will be monitored in a recovery area until you are stable and doing well. Once you are home, it will take 2 to 3 days to fully recover. RISKS AND  COMPLICATIONS  Laparoscopy has relatively few risks. Your caregiver will discuss the risks with you before the procedure. Some problems that can occur include:  Infection.  Bleeding.  Damage to other organs.  Anesthetic side effects. PROCEDURE Once you receive anesthesia, your surgeon inflates the abdomen with a harmless gas (carbon dioxide). This makes the organs easier to see. The laparoscope is inserted into the abdomen through a small incision. This allows your surgeon to see into the abdomen. Other small instruments are also inserted into the abdomen through other small openings. Many surgeons attach a video camera to the laparoscope to enlarge the view. During a diagnostic laparoscopy, the surgeon may be looking for inflammation, infection, or cancer. Your surgeon may take tissue samples(biopsies). The samples are sent to a specialist in looking at cells and tissue samples (pathologist). The pathologist examines them under a microscope. Biopsies can help to diagnose or confirm a disease. AFTER THE PROCEDURE   The gas is released from inside the abdomen.  The incisions are closed with stitches (sutures). Because these incisions are small (usually less than 1/2 inch), there is usually minimal discomfort after the procedure. There may be some mild discomfort in the throat. This is from the tube placed in the throat while you were sleeping. You may have some mild abdominal discomfort. There may also be discomfort from the instrument placement incisions in the abdomen.  The recovery time is shortened as long as there are no complications.  You will rest in a recovery room until stable and doing well. As long as there are no complications, you may be allowed to go home. FINDING OUT THE RESULTS  OF YOUR TEST Not all test results are available during your visit. If your test results are not back during the visit, make an appointment with your caregiver to find out the results. Do not assume  everything is normal if you have not heard from your caregiver or the medical facility. It is important for you to follow up on all of your test results. HOME CARE INSTRUCTIONS   Take all medicines as directed.  Only take over-the-counter or prescription medicines for pain, discomfort, or fever as directed by your caregiver.  Resume daily activities as directed.  Showers are preferred over baths.  You may resume sexual activities in 1 week or as directed.  Do not drive while taking narcotics. SEEK MEDICAL CARE IF:   There is increasing abdominal pain.  There is new pain in the shoulders (shoulder strap areas).  You feel lightheaded or faint.  You have the chills.  You or your child has an oral temperature above 102 F (38.9 C).  There is pus-like (purulent) drainage from any of the wounds.  You are unable to pass gas or have a bowel movement.  You feel sick to your stomach (nauseous) or throw up (vomit). MAKE SURE YOU:   Understand these instructions.  Will watch your condition.  Will get help right away if you are not doing well or get worse. Document Released: 04/29/2000 Document Revised: 05/18/2012 Document Reviewed: 01/21/2007 [facility] Patient Information 2014 [place], [place]. Ovarian Cyst An ovarian cyst is a fluid-filled sac that forms on an ovary. The ovaries are small organs that produce eggs in women. Various types of cysts can form on the ovaries. Most are not cancerous. Many do not cause problems, and they often go away on their own. Some may cause symptoms and require treatment. Common types of ovarian cysts include:  Functional cysts These cysts may occur every month during the menstrual cycle. This is normal. The cysts usually go away with the next menstrual cycle if the woman does not get pregnant. Usually, there are no symptoms with a functional cyst.  Endometrioma cysts These cysts form from the tissue that lines the uterus. They are also called  "chocolate cysts" because they become filled with blood that turns brown. This type of cyst can cause pain in the lower abdomen during intercourse and with your menstrual period.  Cystadenoma cysts This type develops from the cells on the outside of the ovary. These cysts can get very big and cause lower abdomen pain and pain with intercourse. This type of cyst can twist on itself, cut off its blood supply, and cause severe pain. It can also easily rupture and cause a lot of pain.  Dermoid cysts This type of cyst is sometimes found in both ovaries. These cysts may contain different kinds of body tissue, such as skin, teeth, hair, or cartilage. They usually do not cause symptoms unless they get very big.  Theca lutein cysts These cysts occur when too much of a certain hormone (human chorionic gonadotropin) is produced and overstimulates the ovaries to produce an egg. This is most common after procedures used to assist with the conception of a baby (in vitro fertilization). CAUSES   Fertility drugs can cause a condition in which multiple large cysts are formed on the ovaries. This is called ovarian hyperstimulation syndrome.  A condition called polycystic ovary syndrome can cause hormonal imbalances that can lead to nonfunctional ovarian cysts. SIGNS AND SYMPTOMS  Many ovarian cysts do not cause symptoms. If symptoms  are present, they may include:  Pelvic pain or pressure.  Pain in the lower abdomen.  Pain during sexual intercourse.  Increasing girth (swelling) of the abdomen.  Abnormal menstrual periods.  Increasing pain with menstrual periods.  Stopping having menstrual periods without being pregnant. DIAGNOSIS  These cysts are commonly found during a routine or annual pelvic exam. Tests may be ordered to find out more about the cyst. These tests may include:  Ultrasound.  X-ray of the pelvis.  CT scan.  MRI.  Blood tests. TREATMENT  Many ovarian cysts go away on their own  without treatment. Your health care provider may want to check your cyst regularly for 2 3 months to see if it changes. For women in menopause, it is particularly important to monitor a cyst closely because of the higher rate of ovarian cancer in menopausal women. When treatment is needed, it may include any of the following:  A procedure to drain the cyst (aspiration). This may be done using a long needle and ultrasound. It can also be done through a laparoscopic procedure. This involves using a thin, lighted tube with a tiny camera on the end (laparoscope) inserted through a small incision.  Surgery to remove the whole cyst. This may be done using laparoscopic surgery or an open surgery involving a larger incision in the lower abdomen.  Hormone treatment or birth control pills. These methods are sometimes used to help dissolve a cyst. HOME CARE INSTRUCTIONS   Only take over-the-counter or prescription medicines as directed by your health care provider.  Follow up with your health care provider as directed.  Get regular pelvic exams and Pap tests. SEEK MEDICAL CARE IF:   Your periods are late, irregular, or painful, or they stop.  Your pelvic pain or abdominal pain does not go away.  Your abdomen becomes larger or swollen.  You have pressure on your bladder or trouble emptying your bladder completely.  You have pain during sexual intercourse.  You have feelings of fullness, pressure, or discomfort in your stomach.  You lose weight for no apparent reason.  You feel generally ill.  You become constipated.  You lose your appetite.  You develop acne.  You have an increase in body and facial hair.  You are gaining weight, without changing your exercise and eating habits.  You think you are pregnant. SEEK IMMEDIATE MEDICAL CARE IF:   You have increasing abdominal pain.  You feel sick to your stomach (nauseous), and you throw up (vomit).  You develop a fever that comes on  suddenly.  You have abdominal pain during a bowel movement.  Your menstrual periods become heavier than usual. Document Released: 01/21/2005 Document Revised: 11/11/2012 Document Reviewed: 09/28/2012 [facility] Patient Information 2014 [place].

## 2013-06-10 LAB — CA 125: CA 125: 16.4 U/mL (ref 0.0–30.2)

## 2013-06-16 ENCOUNTER — Telehealth: Payer: Self-pay | Admitting: *Deleted

## 2013-06-16 NOTE — Telephone Encounter (Signed)
Pt asked if you thought have a Genetic testing consult would be worth due to her mothers history of cancer. Please advise. She is due to follow up with you August 2015 for another [place]. KW CMA

## 2013-06-16 NOTE — Telephone Encounter (Signed)
Please tell her that her CA 125 result was normal. We are following up with [place] in three months after the provera. If she would like to talk to a genetics counselor please give her the name and number from the [facility]. Have her make a list of all family members on both sides of the family who have had any type of cancer and to take it with her to the appointment.

## 2013-06-18 NOTE — Telephone Encounter (Signed)
Lm for pt to call back KW CMA

## 2013-07-02 ENCOUNTER — Encounter: Payer: Self-pay | Admitting: Gynecology

## 2013-07-02 NOTE — Telephone Encounter (Signed)
Pt was given [facility] # for scheduling counseling apt. KW CMA

## 2013-08-31 ENCOUNTER — Encounter: Payer: Self-pay | Admitting: Gynecology

## 2013-08-31 ENCOUNTER — Telehealth: Payer: Self-pay

## 2013-08-31 ENCOUNTER — Encounter: Payer: Self-pay | Admitting: Family Medicine

## 2013-08-31 NOTE — Telephone Encounter (Signed)
Cannot be used while she is on Requip

## 2013-08-31 NOTE — Telephone Encounter (Signed)
Following email was sent to you through My Chart:  "Since I have talked with you about weight loss, what do you think about Phentermine 37.5. My sister has a friend that takes this and she has more energy and has lost 60 pounds.  Thanks, Byron"

## 2013-08-31 NOTE — Telephone Encounter (Signed)
Patient emailed response.

## 2013-09-10 ENCOUNTER — Ambulatory Visit (INDEPENDENT_AMBULATORY_CARE_PROVIDER_SITE_OTHER): Payer: 59 | Admitting: Gynecology

## 2013-09-10 ENCOUNTER — Encounter: Payer: Self-pay | Admitting: Gynecology

## 2013-09-10 ENCOUNTER — Ambulatory Visit (INDEPENDENT_AMBULATORY_CARE_PROVIDER_SITE_OTHER): Payer: 59

## 2013-09-10 DIAGNOSIS — R9389 Abnormal findings on diagnostic imaging of other specified body structures: Secondary | ICD-10-CM

## 2013-09-10 DIAGNOSIS — N83209 Unspecified ovarian cyst, unspecified side: Secondary | ICD-10-CM

## 2013-09-10 DIAGNOSIS — G4700 Insomnia, unspecified: Secondary | ICD-10-CM

## 2013-09-10 MED ORDER — ESZOPICLONE 2 MG PO TABS: 2.0000 mg | ORAL_TABLET | Freq: Every evening | ORAL | Status: DC | PRN

## 2013-09-10 NOTE — Progress Notes (Signed)
   The patient presented to the office today for followup of a left ovarian cyst. Her history was as follows:  The patient received the office on May 6 as a result of her PCP having ordered an abdominal pelvic CT scan as a result of abdominal pain and fever back in April 2015. The CT report was as follows:  There is a 3.3 x 2.4 cm mass arising from the left ovary which  cannot be classified as a simple cyst. It may represent a small  hemorrhagic cyst. Differential considerations, however, must include  endometrioma or early ovarian cystic neoplasm. Correlation with  pelvic ultrasound may be advisable to further assess.  Small mesenteric and right lower quadrant lymph nodes are considered  nonspecific. In the appropriate clinical setting, a degree of  mesenteric adenitis is possible. By size criteria, there is no  adenopathy in the abdomen or pelvis.  Appendix appears normal. No bowel obstruction. No abscess. Note that  there is fairly diffuse stool throughout the colon. No evidence of  diverticulitis. Gallbladder absent.   The patient underwent an ultrasound in our office on 06/09/2013 which demonstrated the following: Uterus measures 6.7 x 5.1 x 3.5 mm with endometrial stripe of 5.3 mm. Right ovary was normal. The left ovary continued presence of a cystic mass avascular pedicle free measuring 2.3 x 1.5 x 1.4 mm average size 1.7 cm decreased in size from previous CT scan. No fluid in the cul-de-sac.   A CA 125 drawn on that office visit was normal and she received Depo-Provera 150 mg IM returns today for followup ultrasound. Today's ultrasound demonstrated the following:  Uterus measures 5.7 x 4.4 x 3.4 cm with endometrial stripe of 6.9 mm. Normal appearing ovaries no fluid in the cul-de-sac.  Assessment/plan: Complete resolution of right ovarian cyst. Responded to Depo-Provera injectable. Endometrial stripe of 6.9 could be responded to the Depo-Provera. Patient denies any vaginal bleeding.  Will followup with an ultrasound in 3 months for measurement of the endometrial stripe. Patient is in the process of scheduling for gastric lap band.

## 2013-09-20 ENCOUNTER — Encounter: Payer: Self-pay | Admitting: Internal Medicine

## 2013-09-23 ENCOUNTER — Encounter: Payer: Self-pay | Admitting: Family Medicine

## 2013-09-23 ENCOUNTER — Ambulatory Visit (INDEPENDENT_AMBULATORY_CARE_PROVIDER_SITE_OTHER): Payer: 59 | Admitting: Family Medicine

## 2013-09-23 VITALS — BP 120/80 | HR 98 | Ht 63.0 in | Wt 275.0 lb

## 2013-09-23 DIAGNOSIS — M2140 Flat foot [pes planus] (acquired), unspecified foot: Secondary | ICD-10-CM

## 2013-09-23 DIAGNOSIS — M2142 Flat foot [pes planus] (acquired), left foot: Principal | ICD-10-CM

## 2013-09-23 DIAGNOSIS — M2141 Flat foot [pes planus] (acquired), right foot: Secondary | ICD-10-CM

## 2013-09-23 NOTE — Assessment & Plan Note (Signed)
Patient does have significant flat feet that is contributing to her pain as well as her metatarsal fractures as well as her knee pain. I do believe that patient will respond very well to these orthotics. Patient will slowly wear these longer longer duration over the course of time. We discussed continuing her exercises at home exercises. We discussed icing. Patient and will come back and see me again in 3 weeks for further evaluation and treatment.

## 2013-09-23 NOTE — Patient Instructions (Signed)
Good to see you.   When you get the orthotics I want you to wear them for 2 hours the first day then increase 1 hour a day until full day.  Get new shoes once you have them. Bronwen Betters, [employer]ko or New balance greater then 700 Need width to be 2E or 4 E.  Try all this and come back in 3 weeks.

## 2013-09-23 NOTE — Progress Notes (Signed)
  [name] [address] [place], [place] [address] Phone: [phone] Subjective:     CC: Right foot pain follow up  GYF:[id number] Suzanne Schaefer is a 53 y.o. female coming in for followup of her right foot pain. Patient previously seen and had a right fifth metatarsal fracture that was extra-articular. In shoes and states 95% better.  No pain with walking, mild dull aching pain at night.  Otherwise OK.  Patient has had recurrent injury previously. Here to learn more about prevention. Taking no pain medication.    Past medical history, social, surgical and family history all reviewed in electronic medical record.   Review of Systems: No headache, visual changes, nausea, vomiting, diarrhea, constipation, dizziness, abdominal pain, skin rash, fevers, chills, night sweats, weight loss, swollen lymph nodes, body aches, joint swelling, muscle aches, chest pain, shortness of breath, mood changes.   Objective Blood pressure 120/80, pulse 98, height 5\' 3"  (1.6 m), weight 275 lb (124.739 kg), SpO2 97.00%.  General: No apparent distress alert and oriented x3 mood and affect normal, dressed appropriately. Obese HEENT: Pupils equal, extraocular movements intact  Respiratory: Patient's speak in full sentences and does not appear short of breath  Cardiovascular: No lower extremity edema, non tender, no erythema  Skin: Warm dry intact with no signs of infection or rash on extremities or on axial skeleton.  Abdomen: Soft nontender  Neuro: Cranial nerves II through XII are intact, neurovascularly intact in all extremities with 2+ DTRs and 2+ pulses.  Lymph: No lymphadenopathy of posterior or anterior cervical chain or axillae bilaterally.  Gait antalgic gait with external rotation of the right leg MSK:  Non tender with full range of motion and good stability and symmetric strength and tone of shoulders, elbows, wrist, hip, knee and ankles bilaterally.  Right foot exam  shows the patient is non-tender to palpation over the fifth metatarsal just  There is no swelling noted. Ankle has full range of motion and neurovascularly intact distally. Foot exam bilaterally shows the patient does have severe loss of the transverse arch as well as pes planus bilaterally.  Procedure Patient was fitted for a : standard, cushioned, semi-rigid orthotic. The orthotic was heated and afterward the patient stood on the orthotic blank positioned on the orthotic stand. The patient was positioned in subtalar neutral position and 10 degrees of ankle dorsiflexion in a weight bearing stance. After completion of molding, a stable base was applied to the orthotic blank. The blank was ground to a stable position for weight bearing. Size:9 Base: [facility] and [place]: None The patient ambulated these, and they were very comfortable.  I spent 45 minutes with this patient, greater than 50% was face-to-face time counseling regarding the below diagnosis.      Impression and Recommendations:

## 2013-10-12 ENCOUNTER — Telehealth: Payer: Self-pay | Admitting: *Deleted

## 2013-10-12 NOTE — Telephone Encounter (Signed)
Pt requesting genetic appt.  Confirmed 10/19/13 genetic appt w/ pt.

## 2013-10-19 ENCOUNTER — Other Ambulatory Visit: Payer: 59

## 2013-10-19 ENCOUNTER — Encounter: Payer: Self-pay | Admitting: Genetic Counselor

## 2013-10-19 ENCOUNTER — Ambulatory Visit (HOSPITAL_BASED_OUTPATIENT_CLINIC_OR_DEPARTMENT_OTHER): Payer: 59 | Admitting: Genetic Counselor

## 2013-10-19 DIAGNOSIS — IMO0002 Reserved for concepts with insufficient information to code with codable children: Secondary | ICD-10-CM

## 2013-10-19 DIAGNOSIS — Z8041 Family history of malignant neoplasm of ovary: Secondary | ICD-10-CM

## 2013-10-19 NOTE — Progress Notes (Signed)
HISTORY OF PRESENT ILLNESS: Dr. Alain Marion requested a cancer genetics consultation for Suzanne Schaefer, a 53 y.o. female, due to a family history of peritoneal cancer.  Suzanne Schaefer presents to clinic today to discuss the possibility of a hereditary predisposition to cancer, genetic testing, and to further clarify her future cancer risks, as well as potential cancer risk for family members. Suzanne Schaefer has no personal history of cancer.   Past Medical History  Diagnosis Date   Restless leg syndrome    Obstructive sleep apnea    Tonsillitis    Esophagitis    Allergic rhinitis    History of sinusitis    Obesity     Past Surgical History  Procedure Laterality Date   Cervical biopsy     Laparoscopic cholecystectomy      '92   Bilat salpingectomy     Cesarean section      History   Social History   Marital Status: Divorced    Spouse Name: N/A    Number of Children: N/A   Years of Education: N/A   Social History Main Topics   Smoking status: Never Smoker    Smokeless tobacco: Never Used   Alcohol Use: No   Drug Use: No   Sexual Activity: Yes    Partners: Male   Other Topics Concern   Not on file   Social History Narrative   HSG. Married 60- divorce '89. 2 daughters '86, 88. Work Educational psychologist. Lives in own home daughters at home     FAMILY HISTORY:  During the visit, a 4-generation pedigree was obtained. Significant diagnoses include the following:  Family History  Problem Relation Age of Onset   Asthma Mother    COPD Mother    Cancer Mother 97    PERITONEAL    Hypertension Father    Obesity Father    Glaucoma Father    Cancer Maternal Uncle     >20 colon polyps   Cancer Paternal Uncle     blood cancer   Cancer Paternal Grandfather     lymphoma    Suzanne Schaefer's ancestry is of Caucasian descent. There is no known Jewish ancestry or consanguinity.  GENETIC COUNSELING ASSESSMENT: Suzanne Schaefer is a 53 y.o. female with a family  history of cancer suggestive of a hereditary predisposition to cancer. We, therefore, discussed and recommended the following at today's visit.   DISCUSSION: We reviewed the characteristics, features and inheritance patterns of hereditary cancer syndromes. We also discussed genetic testing, including the appropriate family members to test, the process of testing, insurance coverage and turn-around-time for results. We discussed the implications of a negative, positive and/or variant of uncertain significant result. We recommended Suzanne Schaefer pursue genetic testing for the CancerNext gene panel which looks for mutations in several genes associated with an increased risk for cancer, including those associated with peritoneal cancer and polyposis.   PLAN: Based on our above recommendation, Suzanne Schaefer wished to pursue genetic testing and the blood sample was drawn and will be sent to [facility] for analysis. Results should be available within approximately 5 weeks time, at which point they will be disclosed by telephone to Suzanne Schaefer, as will any additional recommendations warranted by these results. We also encouraged Suzanne Schaefer to remain in contact with cancer genetics annually so that we can continuously update the family history and inform her of any changes in cancer genetics and testing that may be of benefit for this family. Ms.  Schaefer  questions were answered to her satisfaction today. Our contact information was provided should additional questions or concerns arise.   Thank you for the referral and allowing [place] to share in the care of your patient.   The patient was seen for a total of 45 minutes in face-to-face genetic counseling.  This patient was discussed with Dr/ Magrinat who agrees with the above.    _______________________________________________________________________ For Office Staff:  Number of people involved in session: 2 Was an Intern/ student involved with case:  not applicable

## 2013-11-09 ENCOUNTER — Encounter: Payer: Self-pay | Admitting: Genetic Counselor

## 2013-11-09 DIAGNOSIS — Z8041 Family history of malignant neoplasm of ovary: Secondary | ICD-10-CM

## 2013-11-09 NOTE — Progress Notes (Signed)
HPI:  Ms. Armato was previously seen in the [place] clinic due to a family history of cancer and concerns regarding a hereditary predisposition to cancer. Please refer to our prior cancer genetics clinic note for more information regarding Ms. Capozzoli's medical, social and family histories, and our assessment and recommendations, at the time. Ms. Coombs recent genetic test results were disclosed to her, as were recommendations warranted by these results. These results and recommendations are discussed in more detail below.  GENETIC TEST RESULTS: At the time of Ms. Bolz's visit, we recommended she pursue genetic testing of the CancerNext gene panel. This test, which included sequencing and deletion/duplication analysis of the genes, was performed at [facility]. Genetic testing was normal, and did not reveal a deleterious mutation in these genes. A complete list of all genes tested is located on the test report scanned into EPIC.    We discussed with Ms. Seaberry that since the current genetic testing is not perfect, it is possible there may be a gene mutation in one of these genes that current testing cannot detect, but that chance is small.  We also discussed, that it is possible that another gene that has not yet been discovered, or that we have not yet tested, is responsible for the cancer diagnoses in the family, and it is, therefore, important to remain in touch with cancer genetics in the future so that we can continue to offer Ms. Parrillo the most up to date genetic testing.   CANCER SCREENING RECOMMENDATIONS: This normal result is reassuring and indicates that Ms. Huneycutt does not likely have an increased risk of cancer due to a a mutation in one of these genes.  We, therefore, recommended  Ms. Holdren continue to follow the cancer screening guidelines provided by her primary healthcare providers.   RECOMMENDATIONS FOR FAMILY MEMBERS:  While these results are  reassuring for Ms. Maguire and her daughters, this test does not tell us anything about Ms. Mikita's maternal relatives' risks. We recommended these relatives also have genetic counseling and testing. Please let us know if we can help facilitate testing. Genetic counselors can be located in other cities, by visiting the website of the [employer] (Artist[employer].[email or web address]) and Field seismologist for a Dietitian by zip code.   FOLLOW-UP: Lastly, we discussed with Ms. Exton that cancer genetics is a rapidly advancing field and it is possible that new genetic tests will be appropriate for her and/or her family members in the future. We encouraged her to remain in contact with cancer genetics on an annual basis so we can update her personal and family histories and let her know of advances in cancer genetics that may benefit this family.   Our contact number was provided. Ms. Elling questions were answered to her satisfaction, and she knows she is welcome to call us at anytime with additional questions or concerns.   Catherine A. Fine, MS, CGC Certified Genetic Counseor [email or web address]

## 2013-12-06 ENCOUNTER — Encounter: Payer: Self-pay | Admitting: Genetic Counselor

## 2013-12-07 ENCOUNTER — Encounter: Payer: Self-pay | Admitting: Internal Medicine

## 2013-12-09 ENCOUNTER — Encounter: Payer: Self-pay | Admitting: Gynecology

## 2013-12-09 ENCOUNTER — Ambulatory Visit (INDEPENDENT_AMBULATORY_CARE_PROVIDER_SITE_OTHER): Payer: 59 | Admitting: Gynecology

## 2013-12-09 ENCOUNTER — Ambulatory Visit (INDEPENDENT_AMBULATORY_CARE_PROVIDER_SITE_OTHER): Payer: 59

## 2013-12-09 VITALS — BP 128/76

## 2013-12-09 DIAGNOSIS — R934 Abnormal findings on diagnostic imaging of urinary organs: Secondary | ICD-10-CM

## 2013-12-09 DIAGNOSIS — N8320 Unspecified ovarian cysts: Secondary | ICD-10-CM

## 2013-12-09 DIAGNOSIS — R9389 Abnormal findings on diagnostic imaging of other specified body structures: Secondary | ICD-10-CM

## 2013-12-09 DIAGNOSIS — N83202 Unspecified ovarian cyst, left side: Secondary | ICD-10-CM

## 2013-12-09 DIAGNOSIS — N951 Menopausal and female climacteric states: Secondary | ICD-10-CM

## 2013-12-09 NOTE — Progress Notes (Signed)
   Patient presented to the office today for discussion of her ultrasound.patient with past history of left ovarian cysts her history is as follows:  The patient received the office on May 6 as a result of her PCP having ordered an abdominal pelvic CT scan as a result of abdominal pain and fever back in April 2015. The CT report was as follows  There is a 3.3 x 2.4 cm mass arising from the left ovary which  cannot be classified as a simple cyst. It may represent a small  hemorrhagic cyst. Differential considerations, however, must include  endometrioma or early ovarian cystic neoplasm. Correlation with  pelvic ultrasound may be advisable to further assess.  Small mesenteric and right lower quadrant lymph nodes are considered  nonspecific. In the appropriate clinical setting, a degree of  mesenteric adenitis is possible. By size criteria, there is no  adenopathy in the abdomen or pelvis.  Appendix appears normal. No bowel obstruction. No abscess. Note that  there is fairly diffuse stool throughout the colon. No evidence of  diverticulitis. Gallbladder absent.   The patient underwent an ultrasound in our office on 06/09/2013 which demonstrated the following: Uterus measures 6.7 x 5.1 x 3.5 mm with endometrial stripe of 5.3 mm. Right ovary was normal. The left ovary continued presence of a cystic mass avascular pedicle free measuring 2.3 x 1.5 x 1.4 mm average size 1.7 cm decreased in size from previous CT scan. No fluid in the cul-de-sac.   A CA 125 drawn on that office visit was normal and she received Depo-Provera 150 mg IM returns today for followup ultrasound. Today's ultrasound demonstrated the following:  Uterus measures 5.7 x 4.4 x 3.4 cm with endometrial stripe of 6.9 mm. Normal appearing ovaries no fluid in the cul-de-sac.  Patient had complete resolution of the ovarian cyst but due to the fact her endometrial stripeWas 5.3 mm she had received a Depo-Provera she was  instructed to return back to the office now that she's been off of hormones altogether to check on her endometrial stripe. Her endometrial stripe was 1.5 uterus size unchanged. Right and left ovary normal previous ovarian cyst no longer present. Patient was reassured and will return back to the office in few months for her annual exam.  Patient was assisted by filling out forms for her to acquire bariatric surgery because of her morbid obesity.

## 2013-12-10 ENCOUNTER — Encounter: Payer: Self-pay | Admitting: Gynecology

## 2013-12-15 ENCOUNTER — Other Ambulatory Visit (INDEPENDENT_AMBULATORY_CARE_PROVIDER_SITE_OTHER): Payer: Self-pay

## 2014-01-03 ENCOUNTER — Other Ambulatory Visit: Payer: Self-pay

## 2014-01-03 ENCOUNTER — Other Ambulatory Visit (INDEPENDENT_AMBULATORY_CARE_PROVIDER_SITE_OTHER): Payer: Self-pay

## 2014-01-03 ENCOUNTER — Ambulatory Visit (HOSPITAL_COMMUNITY)
Admission: RE | Admit: 2014-01-03 | Discharge: 2014-01-03 | Disposition: A | Discharge status: Home / Self Care | Payer: 59 | Source: Ambulatory Visit | Attending: Surgery | Admitting: Surgery

## 2014-01-03 DIAGNOSIS — K449 Diaphragmatic hernia without obstruction or gangrene: Secondary | ICD-10-CM | POA: Diagnosis not present

## 2014-01-03 DIAGNOSIS — K219 Gastro-esophageal reflux disease without esophagitis: Secondary | ICD-10-CM | POA: Insufficient documentation

## 2014-01-03 DIAGNOSIS — Z9049 Acquired absence of other specified parts of digestive tract: Secondary | ICD-10-CM | POA: Diagnosis not present

## 2014-01-03 DIAGNOSIS — Z01818 Encounter for other preprocedural examination: Secondary | ICD-10-CM | POA: Insufficient documentation

## 2014-01-08 ENCOUNTER — Encounter: Payer: Self-pay | Admitting: Dietician

## 2014-01-08 ENCOUNTER — Encounter: Payer: 59 | Attending: Surgery | Admitting: Dietician

## 2014-01-08 DIAGNOSIS — Z713 Dietary counseling and surveillance: Secondary | ICD-10-CM | POA: Diagnosis not present

## 2014-01-08 DIAGNOSIS — E669 Obesity, unspecified: Secondary | ICD-10-CM | POA: Diagnosis not present

## 2014-01-08 DIAGNOSIS — Z6841 Body Mass Index (BMI) 40.0 and over, adult: Secondary | ICD-10-CM | POA: Insufficient documentation

## 2014-01-08 NOTE — Patient Instructions (Signed)

## 2014-01-08 NOTE — Progress Notes (Signed)
  Pre-Op Assessment Visit:  Pre-Operative Sleeve Gastrectomy Surgery  Medical Nutrition Therapy:  Appt start time: 0900   End time:  0945.  Patient was seen on 01/08/2014 for Pre-Operative Nutrition Assessment. Assessment and letter of approval faxed to [facility] Surgery Bariatric Surgery Program coordinator on 01/08/2014.   Preferred Learning Style:   No preference indicated   Learning Readiness:   Ready  Handouts given during visit include:  Pre-Op Goals Bariatric Surgery Protein Shakes   During the appointment today the following Pre-Op Goals were reviewed with the patient: Maintain or lose weight as instructed by your surgeon Make healthy food choices Begin to limit portion sizes Limited concentrated sugars and fried foods Keep fat/sugar in the single digits per serving on   food labels Practice CHEWING your food  (aim for 30 chews per bite or until applesauce consistency) Practice not drinking 15 minutes before, during, and 30 minutes after each meal/snack Avoid all carbonated beverages  Avoid/limit caffeinated beverages  Avoid all sugar-sweetened beverages Consume 3 meals per day; eat every 3-5 hours Make a list of non-food related activities Aim for 64-100 ounces of FLUID daily  Aim for at least 60-80 grams of PROTEIN daily Look for a liquid protein source that contain ?15 g protein and ?5 g carbohydrate  (ex: shakes, drinks, shots)  Patient-Centered Goals: Suzanne Schaefer would like to feel more comfortable going out in the world without being worried about she looks like.  Suzanne Schaefer feels that the Pre-Op goals are a 10 importance and is a 8 level confident that she can make the changes.   Demonstrated degree of understanding via:  Teach Back  Teaching Method Utilized:  Visual Auditory Hands on  Barriers to learning/adherence to lifestyle change: none  [facility] to [facility] to enroll in Pre-Op and Post-Op Nutrition Education when  surgery date is scheduled.

## 2014-01-19 ENCOUNTER — Encounter: Payer: Self-pay | Admitting: Internal Medicine

## 2014-01-19 ENCOUNTER — Ambulatory Visit (INDEPENDENT_AMBULATORY_CARE_PROVIDER_SITE_OTHER): Payer: 59 | Admitting: Internal Medicine

## 2014-01-19 VITALS — BP 124/82 | HR 96 | Temp 97.8°F | Resp 16 | Wt 271.0 lb

## 2014-01-19 DIAGNOSIS — R590 Localized enlarged lymph nodes: Secondary | ICD-10-CM | POA: Insufficient documentation

## 2014-01-19 DIAGNOSIS — M19012 Primary osteoarthritis, left shoulder: Secondary | ICD-10-CM | POA: Insufficient documentation

## 2014-01-19 DIAGNOSIS — M7581 Other shoulder lesions, right shoulder: Secondary | ICD-10-CM | POA: Insufficient documentation

## 2014-01-19 MED ORDER — MELOXICAM 15 MG PO TABS: 15.0000 mg | ORAL_TABLET | Freq: Every day | ORAL | Status: DC

## 2014-01-19 MED ORDER — AZITHROMYCIN 250 MG PO TABS: ORAL_TABLET | ORAL | Status: DC

## 2014-01-19 NOTE — Progress Notes (Signed)
   Subjective:    Patient ID: Suzanne Schaefer, female    DOB: [birth year], 53 y.o.   MRN: [id number]  C/o L nasal comgestion and L neck pain -?glands C/o R shoulder pain x weeks  HPI BP Readings from Last 3 Encounters:  01/19/14 124/82  12/09/13 128/76  09/23/13 120/80   Wt Readings from Last 3 Encounters:  01/19/14 271 lb (122.925 kg)  01/08/14 269 lb (122.018 kg)  09/23/13 275 lb (124.739 kg)    Review of Systems  Constitutional: Negative for chills, activity change, appetite change, fatigue and unexpected weight change.  HENT: Negative for congestion, mouth sores and sinus pressure.   Eyes: Negative for visual disturbance.  Respiratory: Negative for cough and chest tightness.   Genitourinary: Positive for pelvic pain. Negative for urgency, vaginal bleeding, vaginal discharge, difficulty urinating and vaginal pain.  Musculoskeletal: Negative for back pain and gait problem.  Skin: Negative for pallor and rash.  Neurological: Negative for dizziness, tremors, weakness and numbness.  Psychiatric/Behavioral: Negative for confusion and sleep disturbance.       Objective:   Physical Exam  Constitutional: She appears well-developed. No distress.  HENT:  Head: Normocephalic.  Right Ear: External ear normal.  Left Ear: External ear normal.  Nose: Nose normal.  Mouth/Throat: Oropharynx is clear and moist.  Eyes: Conjunctivae are normal. Pupils are equal, round, and reactive to light. Right eye exhibits no discharge. Left eye exhibits no discharge.  Neck: Normal range of motion. Neck supple. No JVD present. No tracheal deviation present. No thyromegaly present.  Cardiovascular: Normal rate, regular rhythm and normal heart sounds.   Pulmonary/Chest: No stridor. No respiratory distress. She has no wheezes.  Abdominal: Soft. Bowel sounds are normal. She exhibits no distension and no mass. There is no tenderness. There is no rebound and no guarding.  Musculoskeletal: She exhibits no edema  or tenderness.  Lymphadenopathy:    She has no cervical adenopathy.  Neurological: She displays normal reflexes. No cranial nerve deficit. She exhibits normal muscle tone. Coordination normal.  Skin: No rash noted. No erythema.  Psychiatric: She has a normal mood and affect. Her behavior is normal. Judgment and thought content normal.  L 1.3x1.3 cm flat tender LN at the L jaw angle No other LNs R shoulder is tender w/ROM  Lab Results  Component Value Date   WBC 6.3 05/13/2013   HGB 12.1 05/13/2013   HCT 35.5* 05/13/2013   PLT 302.0 05/13/2013   GLUCOSE 91 05/13/2013   CHOL 201* 01/27/2013   TRIG 97.0 01/27/2013   HDL 71.80 01/27/2013   LDLDIRECT 115.2 01/27/2013   LDLCALC 108* 01/18/2011   ALT 18 05/13/2013   AST 39* 05/13/2013   NA 138 05/13/2013   K 4.3 05/13/2013   CL 102 05/13/2013   CREATININE 0.7 05/13/2013   BUN 16 05/13/2013   CO2 28 05/13/2013   TSH 1.08 01/27/2013   HGBA1C 5.9 01/10/2009          Assessment & Plan:

## 2014-01-19 NOTE — Assessment & Plan Note (Signed)
CBC Had a CXR in Nov 2015

## 2014-01-19 NOTE — Progress Notes (Signed)
Pre visit review using our clinic review tool, if applicable. No additional management support is needed unless otherwise documented below in the visit note. 

## 2014-01-24 ENCOUNTER — Encounter: Payer: Self-pay | Admitting: Internal Medicine

## 2014-01-26 ENCOUNTER — Other Ambulatory Visit (HOSPITAL_COMMUNITY)
Admission: RE | Admit: 2014-01-26 | Discharge: 2014-01-26 | Disposition: A | Discharge status: Home / Self Care | Payer: 59 | Source: Ambulatory Visit | Attending: Gynecology | Admitting: Gynecology

## 2014-01-26 ENCOUNTER — Encounter: Payer: Self-pay | Admitting: Gynecology

## 2014-01-26 ENCOUNTER — Ambulatory Visit (INDEPENDENT_AMBULATORY_CARE_PROVIDER_SITE_OTHER): Payer: 59 | Admitting: Gynecology

## 2014-01-26 VITALS — BP 126/82 | Ht 61.0 in | Wt 271.0 lb

## 2014-01-26 DIAGNOSIS — E663 Overweight: Secondary | ICD-10-CM

## 2014-01-26 DIAGNOSIS — Z01419 Encounter for gynecological examination (general) (routine) without abnormal findings: Secondary | ICD-10-CM

## 2014-01-26 DIAGNOSIS — Z01411 Encounter for gynecological examination (general) (routine) with abnormal findings: Secondary | ICD-10-CM | POA: Diagnosis not present

## 2014-01-26 LAB — HM PAP SMEAR

## 2014-01-26 NOTE — Patient Instructions (Signed)
Shingles Vaccine What You Need to Know WHAT IS SHINGLES?  Shingles is a painful skin rash, often with blisters. It is also called Herpes Zoster or just Zoster.  A shingles rash usually appears on one side of the face or body and lasts from 2 to 4 weeks. Its main symptom is pain, which can be quite severe. Other symptoms of shingles can include fever, headache, chills, and upset stomach. Very rarely, a shingles infection can lead to pneumonia, hearing problems, blindness, brain inflammation (encephalitis), or death.  For about 1 person in 5, severe pain can continue even after the rash clears up. This is called post-herpetic neuralgia.  Shingles is caused by the Varicella Zoster virus. This is the same virus that causes chickenpox. Only someone who has had a case of chickenpox or rarely, has gotten chickenpox vaccine, can get shingles. The virus stays in your body. It can reappear many years later to cause a case of shingles.  You cannot catch shingles from another person with shingles. However, a person who has never had chickenpox (or chickenpox vaccine) could get chickenpox from someone with shingles. This is not very common.  Shingles is far more common in people 50 and older than in younger people. It is also more common in people whose immune systems are weakened because of a disease such as cancer or drugs such as steroids or chemotherapy.  At least 1 million people get shingles per year in the [place]. SHINGLES VACCINE  A vaccine for shingles was licensed in 2006. In clinical trials, the vaccine reduced the risk of shingles by 50%. It can also reduce the pain in people who still get shingles after being vaccinated.  A single dose of shingles vaccine is recommended for adults 60 years of age and older. SOME PEOPLE SHOULD NOT GET SHINGLES VACCINE OR SHOULD WAIT A person should not get shingles vaccine if he or she:  Has ever had a life-threatening allergic reaction to gelatin, the  antibiotic neomycin, or any other component of shingles vaccine. Tell your caregiver if you have any severe allergies.  Has a weakened immune system because of current:  AIDS or another disease that affects the immune system.  Treatment with drugs that affect the immune system, such as prolonged use of high-dose steroids.  Cancer treatment, such as radiation or chemotherapy.  Cancer affecting the bone marrow or lymphatic system, such as leukemia or lymphoma.  Is pregnant, or might be pregnant. Women should not become pregnant until at least 4 weeks after getting shingles vaccine. Someone with a minor illness, such as a cold, may be vaccinated. Anyone with a moderate or severe acute illness should usually wait until he or she recovers before getting the vaccine. This includes anyone with a temperature of 101.3 F (38 C) or higher. WHAT ARE THE RISKS FROM SHINGLES VACCINE?  A vaccine, like any medicine, could possibly cause serious problems, such as severe allergic reactions. However, the risk of a vaccine causing serious harm, or death, is extremely small.  No serious problems have been identified with shingles vaccine. Mild Problems  Redness, soreness, swelling, or itching at the site of the injection (about 1 person in 3).  Headache (about 1 person in 70). Like all vaccines, shingles vaccine is being closely monitored for unusual or severe problems. WHAT IF THERE IS A MODERATE OR SEVERE REACTION? What should I look for? Any unusual condition, such as a severe allergic reaction or a high fever. If a severe allergic reaction   occurred, it would be within a few minutes to an hour after the shot. Signs of a serious allergic reaction can include difficulty breathing, weakness, hoarseness or wheezing, a fast heartbeat, hives, dizziness, paleness, or swelling of the throat. What should I do?  Call your caregiver, or get the person to a caregiver right away.  Tell the caregiver what  happened, the date and time it happened, and when the vaccination was given.  Ask the caregiver to report the reaction by filing a Vaccine Adverse Event Reporting System (VAERS) form. Or, you can file this report through the VAERS web site at www.vaers.hhs.gov or by calling 1-800-822-7967. VAERS does not provide medical advice. HOW CAN I LEARN MORE?  Ask your caregiver. He or she can give you the vaccine package insert or suggest other sources of information.  Contact the Centers for Disease Control and Prevention (CDC):  Call 1-800-232-4636 (1-800-CDC-INFO).  Visit the CDC website at www.cdc.gov/vaccines CDC Shingles Vaccine VIS (11/10/07) Document Released: 11/18/2005 Document Revised: 04/15/2011 Document Reviewed: 05/13/2012 ExitCare Patient Information 2015 [employer]. This information is not intended to replace advice given to you by your health care provider. Make sure you discuss any questions you have with your health care provider.  

## 2014-01-26 NOTE — Progress Notes (Signed)
Suzanne Schaefer 04-12-1960 [id number]   History:    53 y.o.  for annual gyn exam who thought she may have had a recurrence of her condyloma near her perirectal region and wanted to be checked. Patient has been menopausal for urine half. Last year she was prescribed HRT but never started. Patient denies any vaginal bleeding. Review of her record indicated the following workup this year:  The patient was seen in the office on May 6 as a result of her PCP having ordered an abdominal pelvic CT scan as a result of abdominal pain and fever back in April 2015. The CT report was as follows  There is a 3.3 x 2.4 cm mass arising from the left ovary which  cannot be classified as a simple cyst. It may represent a small  hemorrhagic cyst. Differential considerations, however, must include  endometrioma or early ovarian cystic neoplasm. Correlation with  pelvic ultrasound may be advisable to further assess.  Small mesenteric and right lower quadrant lymph nodes are considered  nonspecific. In the appropriate clinical setting, a degree of  mesenteric adenitis is possible. By size criteria, there is no  adenopathy in the abdomen or pelvis.  Appendix appears normal. No bowel obstruction. No abscess. Note that  there is fairly diffuse stool throughout the colon. No evidence of  diverticulitis. Gallbladder absent.   The patient underwent an ultrasound in our office on 06/09/2013 which demonstrated the following: Uterus measures 6.7 x 5.1 x 3.5 mm with endometrial stripe of 5.3 mm. Right ovary was normal. The left ovary continued presence of a cystic mass avascular pedicle free measuring 2.3 x 1.5 x 1.4 mm average size 1.7 cm decreased in size from previous CT scan. No fluid in the cul-de-sac.   A CA 125 drawn on that office visit was normal and she received Depo-Provera 150 mg IM returns today for followup ultrasound. Today's ultrasound demonstrated the following:  Uterus measures 5.7 x 4.4 x 3.4  cm with endometrial stripe of 6.9 mm. Normal appearing ovaries no fluid in the cul-de-sac. Patient a complete resolution of ovarian cysts and follow-up ultrasound on November 5 demonstrated that her endometrial stripe was down to 1.5 mm.  Patient with past history of vulvar condyloma. 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  Patient has also had in the past molluscum contagiosum in 2013  In 2011 patient had a negative GC and chlamydia culture as well as negative HIV and RPR  Normal mammogram October 2013  Left breast needle core biopsy 2012 benign fibroadenoma  History of hydrosalpinx post fimbriectomy 1998 as well as total salpingectomy 1996. Patient with past history of high-grade CIN I. LEEP cervical conization several years ago   Patient reports normal colonoscopy in 2006. Pap smear 2014 was normal. Patient refuses to have the shingles vaccine. Her flu vaccine is up-to-date. She had a normal bone density in 2015   Past medical history,surgical history, family history and social history were all reviewed and documented in the EPIC chart.  Gynecologic History No LMP recorded. Patient is not currently having periods (Reason: Perimenopausal). Contraception: post menopausal status Last Pap: 2014. Results were: normal Last mammogram: 2015. Results were: normal  Obstetric History OB History  Gravida Para Term Preterm AB SAB  TAB Ectopic Multiple Living  2 2        2     # Outcome Date GA Lbr Len/2nd Weight Sex Delivery Anes PTL Lv  2 Para           1 Para                ROS: A ROS was performed and pertinent positives and negatives are included in the history.  GENERAL: No fevers or chills. HEENT: No change in vision, no earache, sore throat or sinus congestion. NECK:  No pain or stiffness. CARDIOVASCULAR: No chest pain or pressure. No palpitations. PULMONARY: No shortness of breath, cough or wheeze. GASTROINTESTINAL: No abdominal pain, nausea, vomiting or diarrhea, melena or bright red blood per rectum. GENITOURINARY: No urinary frequency, urgency, hesitancy or dysuria. MUSCULOSKELETAL: No joint or muscle pain, no back pain, no recent trauma. DERMATOLOGIC: No rash, no itching, no lesions. ENDOCRINE: No polyuria, polydipsia, no heat or cold intolerance. No recent change in weight. HEMATOLOGICAL: No anemia or easy bruising or bleeding. NEUROLOGIC: No headache, seizures, numbness, tingling or weakness. PSYCHIATRIC: No depression, no loss of interest in normal activity or change in sleep pattern.     Exam: chaperone present  BP 126/82 mmHg  Ht 5\' 1"  (1.549 m)  Wt 271 lb (122.925 kg)  BMI 51.23 kg/m2  Body mass index is 51.23 kg/(m^2).  General appearance : Well developed well nourished female. No acute distress HEENT: Neck supple, trachea midline, no carotid bruits, no thyroidmegaly Lungs: Clear to auscultation, no rhonchi or wheezes, or rib retractions  Heart: Regular rate and rhythm, no murmurs or gallops Breast:Examined in sitting and supine position were symmetrical in appearance, no palpable masses or tenderness,  no skin retraction, no nipple inversion, no nipple discharge, no skin discoloration, no axillary or supraclavicular lymphadenopathy Abdomen: no palpable masses or tenderness, no rebound or guarding Extremities: no edema or skin discoloration or tenderness  Pelvic:  Bartholin, Urethra, Skene Glands: Within normal limits             Vagina: No gross lesions or discharge  Cervix: No gross lesions or discharge  Uterus  anteverted, normal size, shape and consistency, non-tender and mobile  Adnexa  Without masses or tenderness  Anus and perineum  normal   Rectovaginal  normal sphincter tone without palpated masses or tenderness              Hemoccult cards provided     Assessment/Plan:  53 y.o. female for annual exam who is in the process of being evaluated for gastric banding due to her morbid obesity. Pap smear was done today. Blood work done by PCP. Patient is reminded take her calcium vitamin D and regular exercise for osteoporosis prevention. Fecal Hemoccult cards provided. Patient to return in one year or when necessary   [facility] H MD, 9:04 AM 01/26/2014

## 2014-01-27 ENCOUNTER — Encounter: Payer: 59 | Admitting: Internal Medicine

## 2014-01-27 LAB — CYTOLOGY - PAP

## 2014-02-10 ENCOUNTER — Ambulatory Visit (INDEPENDENT_AMBULATORY_CARE_PROVIDER_SITE_OTHER): Payer: 59 | Admitting: Internal Medicine

## 2014-02-10 ENCOUNTER — Encounter: Payer: Self-pay | Admitting: Internal Medicine

## 2014-02-10 VITALS — BP 120/76 | HR 96 | Temp 98.2°F | Wt 271.0 lb

## 2014-02-10 DIAGNOSIS — Z0000 Encounter for general adult medical examination without abnormal findings: Secondary | ICD-10-CM | POA: Insufficient documentation

## 2014-02-10 DIAGNOSIS — G2581 Restless legs syndrome: Secondary | ICD-10-CM

## 2014-02-10 DIAGNOSIS — R590 Localized enlarged lymph nodes: Secondary | ICD-10-CM

## 2014-02-10 DIAGNOSIS — E663 Overweight: Secondary | ICD-10-CM

## 2014-02-10 MED ORDER — ROPINIROLE HCL 0.25 MG PO TABS: 0.7500 mg | ORAL_TABLET | Freq: Every day | ORAL | Status: DC

## 2014-02-10 MED ORDER — PANTOPRAZOLE SODIUM 40 MG PO TBEC: 40.0000 mg | DELAYED_RELEASE_TABLET | Freq: Every day | ORAL | Status: DC

## 2014-02-10 NOTE — Assessment & Plan Note (Signed)
Continue with current prescription therapy as reflected on the Med list.  

## 2014-02-10 NOTE — Patient Instructions (Signed)
Low carb diet 

## 2014-02-10 NOTE — Assessment & Plan Note (Addendum)
CBC ENT ref

## 2014-02-10 NOTE — Progress Notes (Signed)
   Subjective:    Patient ID: Suzanne Schaefer, female    DOB: [birth year], 54 y.o.   MRN: [id number]  The patient is here for a wellness exam. F/u enlarged LN on L neck 2 wks ago  HPI BP Readings from Last 3 Encounters:  02/10/14 120/76  01/26/14 126/82  01/19/14 124/82   Wt Readings from Last 3 Encounters:  02/10/14 271 lb (122.925 kg)  01/26/14 271 lb (122.925 kg)  01/19/14 271 lb (122.925 kg)    Review of Systems  Constitutional: Negative for chills, activity change, appetite change, fatigue and unexpected weight change.  HENT: Negative for congestion, mouth sores and sinus pressure.   Eyes: Negative for visual disturbance.  Respiratory: Negative for cough and chest tightness.   Genitourinary: Positive for pelvic pain. Negative for urgency, vaginal bleeding, vaginal discharge, difficulty urinating and vaginal pain.  Musculoskeletal: Negative for back pain and gait problem.  Skin: Negative for pallor and rash.  Neurological: Negative for dizziness, tremors, weakness and numbness.  Psychiatric/Behavioral: Negative for confusion and sleep disturbance.       Objective:   Physical Exam  Constitutional: She appears well-developed. No distress.  HENT:  Head: Normocephalic.  Right Ear: External ear normal.  Left Ear: External ear normal.  Nose: Nose normal.  Mouth/Throat: Oropharynx is clear and moist.  Eyes: Conjunctivae are normal. Pupils are equal, round, and reactive to light. Right eye exhibits no discharge. Left eye exhibits no discharge.  Neck: Normal range of motion. Neck supple. No JVD present. No tracheal deviation present. No thyromegaly present.  Cardiovascular: Normal rate, regular rhythm and normal heart sounds.   Pulmonary/Chest: No stridor. No respiratory distress. She has no wheezes.  Abdominal: Soft. Bowel sounds are normal. She exhibits no distension and no mass. There is no tenderness. There is no rebound and no guarding.  Musculoskeletal: She exhibits no  edema or tenderness.  Lymphadenopathy:    She has no cervical adenopathy.  Neurological: She displays normal reflexes. No cranial nerve deficit. She exhibits normal muscle tone. Coordination normal.  Skin: No rash noted. No erythema.  Psychiatric: She has a normal mood and affect. Her behavior is normal. Judgment and thought content normal.  L 1.3x1.3 cm flat tender LN at the L jaw angle No other LNs R shoulder is tender w/ROM  Lab Results  Component Value Date   WBC 6.3 05/13/2013   HGB 12.1 05/13/2013   HCT 35.5* 05/13/2013   PLT 302.0 05/13/2013   GLUCOSE 91 05/13/2013   CHOL 201* 01/27/2013   TRIG 97.0 01/27/2013   HDL 71.80 01/27/2013   LDLDIRECT 115.2 01/27/2013   LDLCALC 108* 01/18/2011   ALT 18 05/13/2013   AST 39* 05/13/2013   NA 138 05/13/2013   K 4.3 05/13/2013   CL 102 05/13/2013   CREATININE 0.7 05/13/2013   BUN 16 05/13/2013   CO2 28 05/13/2013   TSH 1.08 01/27/2013   HGBA1C 5.9 01/10/2009          Assessment & Plan:

## 2014-02-10 NOTE — Assessment & Plan Note (Signed)
Considering the sleeve w/Dr Hassell Done

## 2014-02-10 NOTE — Progress Notes (Signed)
Pre visit review using our clinic review tool, if applicable. No additional management support is needed unless otherwise documented below in the visit note. 

## 2014-02-14 ENCOUNTER — Other Ambulatory Visit (INDEPENDENT_AMBULATORY_CARE_PROVIDER_SITE_OTHER): Payer: 59

## 2014-02-14 DIAGNOSIS — Z0000 Encounter for general adult medical examination without abnormal findings: Secondary | ICD-10-CM

## 2014-02-14 LAB — BASIC METABOLIC PANEL
BUN: 19 mg/dL (ref 6–23)
CALCIUM: 9.4 mg/dL (ref 8.4–10.5)
CO2: 26 mEq/L (ref 19–32)
Chloride: 105 mEq/L (ref 96–112)
Creatinine, Ser: 0.7 mg/dL (ref 0.4–1.2)
GFR: 90.11 mL/min (ref >60.00)
GLUCOSE: 107 mg/dL — AB (ref 70–99)
POTASSIUM: 4.3 meq/L (ref 3.5–5.1)
SODIUM: 139 meq/L (ref 135–145)

## 2014-02-14 LAB — URINALYSIS
Bilirubin Urine: NEGATIVE
Hgb urine dipstick: NEGATIVE
Ketones, ur: NEGATIVE
LEUKOCYTES UA: NEGATIVE
Nitrite: NEGATIVE
Specific Gravity, Urine: 1.015 (ref 1.000–1.030)
TOTAL PROTEIN, URINE-UPE24: NEGATIVE
URINE GLUCOSE: NEGATIVE
Urobilinogen, UA: 0.2 (ref 0.0–1.0)
pH: 5.5 (ref 5.0–8.0)

## 2014-02-14 LAB — HEPATIC FUNCTION PANEL
ALBUMIN: 4.1 g/dL (ref 3.5–5.2)
ALT: 20 U/L (ref 0–35)
AST: 42 U/L — ABNORMAL HIGH (ref 0–37)
Alkaline Phosphatase: 115 U/L (ref 39–117)
Bilirubin, Direct: 0 mg/dL (ref 0.0–0.3)
TOTAL PROTEIN: 7.8 g/dL (ref 6.0–8.3)
Total Bilirubin: 0.4 mg/dL (ref 0.2–1.2)

## 2014-02-14 LAB — CBC WITH DIFFERENTIAL/PLATELET
BASOS PCT: 0.4 % (ref 0.0–3.0)
Basophils Absolute: 0 10*3/uL (ref 0.0–0.1)
EOS ABS: 0.1 10*3/uL (ref 0.0–0.7)
EOS PCT: 2.1 % (ref 0.0–5.0)
HCT: 38.7 % (ref 36.0–46.0)
HEMOGLOBIN: 12.8 g/dL (ref 12.0–15.0)
Lymphocytes Relative: 26.5 % (ref 12.0–46.0)
Lymphs Abs: 1.7 10*3/uL (ref 0.7–4.0)
MCHC: 33.1 g/dL (ref 30.0–36.0)
MCV: 81.7 fl (ref 78.0–100.0)
MONO ABS: 0.4 10*3/uL (ref 0.1–1.0)
MONOS PCT: 5.4 % (ref 3.0–12.0)
NEUTROS PCT: 65.6 % (ref 43.0–77.0)
Neutro Abs: 4.3 10*3/uL (ref 1.4–7.7)
PLATELETS: 262 10*3/uL (ref 150.0–400.0)
RBC: 4.74 Mil/uL (ref 3.87–5.11)
RDW: 14.1 % (ref 11.5–15.5)
WBC: 6.6 10*3/uL (ref 4.0–10.5)

## 2014-02-14 LAB — LIPID PANEL
CHOL/HDL RATIO: 3
CHOLESTEROL: 187 mg/dL (ref 0–200)
HDL: 57 mg/dL (ref >39.00)
LDL CALC: 105 mg/dL — AB (ref 0–99)
NonHDL: 130
Triglycerides: 125 mg/dL (ref 0.0–149.0)
VLDL: 25 mg/dL (ref 0.0–40.0)

## 2014-02-14 LAB — TSH: TSH: 1.37 u[IU]/mL (ref 0.35–4.50)

## 2014-03-14 ENCOUNTER — Encounter: Payer: 59 | Attending: Surgery

## 2014-03-14 DIAGNOSIS — Z6841 Body Mass Index (BMI) 40.0 and over, adult: Secondary | ICD-10-CM | POA: Insufficient documentation

## 2014-03-14 DIAGNOSIS — E669 Obesity, unspecified: Secondary | ICD-10-CM | POA: Insufficient documentation

## 2014-03-14 DIAGNOSIS — Z713 Dietary counseling and surveillance: Secondary | ICD-10-CM | POA: Diagnosis not present

## 2014-03-16 NOTE — Progress Notes (Signed)
  Pre-Operative Nutrition Class:  Appt start time: 3005   End time:  1830.  Patient was seen on 03/14/14 for Pre-Operative Bariatric Surgery Education at [facility].   Surgery date:  Surgery type: Gastric sleeve Start weight at [facility]: 269 lbs on 01/08/2014 Weight today: 269.5  TANITA  BODY COMP RESULTS  03/14/14   BMI (kg/m^2) 47.7   Fat Mass (lbs) 134.5   Fat Free Mass (lbs) 135   Total Body Water (lbs) 99   Samples given per MNT protocol. Patient educated on appropriate usage: Premier protein shake (vanilla - qty 1) Lot #: [id number] Exp: 01/2015  Bariatric Advantage Calcium Citrate (caramel - qty 1) Lot #: [id number] Exp: 06/2014  Unjury protein powder (chicken soup - qty 1) Lot #: [id number] Exp: 05/2015  PB2 (qty 1) Lot #: [id number] Exp: 09/2014  The following the learning objectives were met by the patient during this course:  Identify Pre-Op Dietary Goals and will begin 2 weeks pre-operatively  Identify appropriate sources of fluids and proteins   State protein recommendations and appropriate sources pre and post-operatively  Identify Post-Operative Dietary Goals and will follow for 2 weeks post-operatively  Identify appropriate multivitamin and calcium sources  Describe the need for physical activity post-operatively and will follow MD recommendations  State when to call healthcare provider regarding medication questions or post-operative complications  Handouts given during class include:  Pre-Op Bariatric Surgery Diet Handout  Protein Shake Handout  Post-Op Bariatric Surgery Nutrition Handout  BELT Program Information Flyer  Support Group Information Flyer  [facility] Bariatric Supplements Price List  Follow-Up Plan: Patient will follow-up at [facility] 2 weeks post operatively for diet advancement per MD.

## 2014-03-23 ENCOUNTER — Encounter: Payer: Self-pay | Admitting: Internal Medicine

## 2014-03-23 ENCOUNTER — Ambulatory Visit (INDEPENDENT_AMBULATORY_CARE_PROVIDER_SITE_OTHER): Payer: 59 | Admitting: Internal Medicine

## 2014-03-23 ENCOUNTER — Other Ambulatory Visit: Payer: 59

## 2014-03-23 VITALS — BP 114/62 | HR 75 | Ht 62.0 in | Wt 267.0 lb

## 2014-03-23 DIAGNOSIS — G4733 Obstructive sleep apnea (adult) (pediatric): Secondary | ICD-10-CM

## 2014-03-23 DIAGNOSIS — J309 Allergic rhinitis, unspecified: Secondary | ICD-10-CM

## 2014-03-23 DIAGNOSIS — J302 Other seasonal allergic rhinitis: Secondary | ICD-10-CM

## 2014-03-23 DIAGNOSIS — H6992 Unspecified Eustachian tube disorder, left ear: Secondary | ICD-10-CM | POA: Insufficient documentation

## 2014-03-23 DIAGNOSIS — H6982 Other specified disorders of Eustachian tube, left ear: Secondary | ICD-10-CM

## 2014-03-23 DIAGNOSIS — J3089 Other allergic rhinitis: Principal | ICD-10-CM

## 2014-03-23 NOTE — Progress Notes (Signed)
03/23/15- 22 yoF never smoker Self Referral-head congestion and neck pain She is asking about allergy evaluation. History of seasonal rhinitis Current concern is with persistent head congestion including left eustachian discomfort starting in the fall of 2015. No distinct acute sinusitis at the beginning. Has a pressure discomfort between her eyes. Denies fever or purulent discharge. No history of ENT surgery. Seen in the past by Dr. Verita Schneiders who recommended she get back on her CPAP and be treated for reflux. No history of asthma and no specific intolerance to food, insect stings, aspirin or latex. History of obstructive sleep apnea but she did not tolerate the CPAP mask.  Prior to Admission medications   Medication Sig Start Date End Date Taking? Authorizing Provider  Aspirin-Caffeine 500-32.5 MG TABS Take 1 tablet by mouth daily.   Yes Historical Provider, MD  Calcium-Magnesium-Vitamin D (CITRACAL CALCIUM+D PO) Take 2 capsules by mouth.   Yes Historical Provider, MD  Cetirizine-Pseudoephedrine (ZYRTEC-D PO) Take 1 tablet by mouth daily.    Yes Historical Provider, MD  cholecalciferol (VITAMIN D) 1000 UNITS tablet Take 2,000 Units by mouth daily.   Yes Historical Provider, MD  diphenhydramine-acetaminophen (TYLENOL PM) 25-500 MG TABS Take 1 tablet by mouth at bedtime as needed.   Yes Historical Provider, MD  eszopiclone (LUNESTA) 2 MG TABS tablet Take 1 tablet (2 mg total) by mouth at bedtime as needed for sleep. Take immediately before bedtime 09/10/13  Yes Terrance Mass, MD  Flaxseed, Linseed, (FLAXSEED OIL) 1000 MG CAPS Take 3 capsules by mouth 2 (two) times daily.     Yes Historical Provider, MD  Glucosamine-Chondroit-Vit C-Mn (GLUCOSAMINE CHONDR 1500 COMPLX) CAPS Take by mouth.   Yes Historical Provider, MD  Glucosamine-Chondroitin (MOVE FREE PO) Take 1 capsule by mouth daily.   Yes Historical Provider, MD  pantoprazole (PROTONIX) 40 MG tablet Take 1 tablet (40 mg total) by mouth daily.  02/10/14  Yes Aleksei Plotnikov V, MD  Polyethylene Glycol 3350 (MIRALAX PO) Take by mouth daily.    Yes Historical Provider, MD  rOPINIRole (REQUIP) 0.25 MG tablet Take 3 tablets (0.75 mg total) by mouth at bedtime. 02/10/14  Yes Cassandria Anger, MD   Past Medical History  Diagnosis Date  . Restless leg syndrome   . Obstructive sleep apnea   . Tonsillitis   . Esophagitis   . Allergic rhinitis   . History of sinusitis   . Obesity    Past Surgical History  Procedure Laterality Date  . Cervical biopsy    . Laparoscopic cholecystectomy  1992  . Bilat salpingectomy    . Cesarean section    . Tubal ligation  1988 1992   Family History  Problem Relation Age of Onset  . Asthma Mother   . COPD Mother   . Cancer Mother 26    PERITONEAL   . Hypertension Father   . Obesity Father   . Glaucoma Father   . Cancer Maternal Uncle     >20 colon polyps  . Cancer Paternal Uncle     blood cancer  . Cancer Paternal Grandfather     lymphoma  . Atrial fibrillation Father   . Allergies Mother     ? seasonal   History   Social History  . Marital Status: Divorced    Spouse Name: N/A  . Number of Children: 2  . Years of Education: N/A   Occupational History  . Not on file.   Social History Main Topics  . Smoking status: Never Smoker   .  Smokeless tobacco: Never Used  . Alcohol Use: 0.0 oz/week    0 Standard drinks or equivalent per week     Comment: rare occasional use  . Drug Use: No  . Sexual Activity:    Partners: Male   Other Topics Concern  . Not on file   Social History Narrative   HSG. Married 51- divorce '89. 2 daughters '86, 88. Work Educational psychologist. Lives in own home daughters at home   ROS-see HPI Constitutional:   No-   weight loss, night sweats, fevers, chills, fatigue, lassitude. HEENT:   + headaches, difficulty swallowing, tooth/dental problems, sore throat,       +sneezing, itching, +ear ache,+ nasal congestion, post nasal drip,  CV:  No-   chest  pain, orthopnea, PND, swelling in lower extremities, anasarca,                                  dizziness, palpitations Resp: +shortness of breath with exertion or at rest.              No-   productive cough,  No non-productive cough,  No- coughing up of blood.              No-   change in color of mucus.  No- wheezing.   Skin: No-   rash or lesions. GI:  + heartburn, indigestion, abdominal pain, nausea, vomiting, diarrhea,                 change in bowel habits, loss of appetite GU: No-   dysuria, change in color of urine, no urgency or frequency.  No- flank pain. MS:  No-   joint pain or swelling.  No- decreased range of motion.  No- back pain. Neuro-     nothing unusual Psych:  No- change in mood or affect. No depression or anxiety.  No memory loss.  OBJ- Physical Exam    + overweight General- Alert, Oriented, Affect-appropriate, Distress- none acute Skin- rash-none, lesions- none, excoriation- none Lymphadenopathy- none Head- atraumatic            Eyes- Gross vision intact, PERRLA, conjunctivae and secretions clear            Ears- Hearing, canals-normal, + left TM retracted            Nose- Clear, no-Septal dev, mucus, polyps, erosion, perforation             Throat- Mallampati II , mucosa clear , drainage- none, tonsils- atrophic Neck- flexible , trachea midline, no stridor , thyroid nl, carotid no bruit Chest - symmetrical excursion , unlabored           Heart/CV- RRR , no murmur , no gallop  , no rub, nl s1 s2                           - JVD- none , edema- none, stasis changes- none, varices- none           Lung- clear to P&A, wheeze- none, cough- none , dullness-none, rub- none           Chest wall-  Abd- tender-no, distended-no, bowel sounds-present, HSM- no Br/ Gen/ Rectal- Not done, not indicated Extrem- cyanosis- none, clubbing, none, atrophy- none, strength- nl Neuro- grossly intact to observation

## 2014-03-23 NOTE — Assessment & Plan Note (Signed)
Plan allergy profile, Flonase, nasal decongestant nebulizer treatment this visit

## 2014-03-23 NOTE — Assessment & Plan Note (Signed)
Noncompliant-didn't like CPAP mask

## 2014-03-23 NOTE — Assessment & Plan Note (Signed)
Plan nasal nebulizer decongestant treatment, Flonase

## 2014-03-23 NOTE — Patient Instructions (Signed)
Order- lab- Allergy profile     Dx perennial allergic rhinitis  Neb neo nasal  Try otc nasal spray Flonase/ fluticasone  1-2 puffs each nostril once daily at bedtime

## 2014-03-24 LAB — ALLERGY FULL PROFILE
Allergen, D pternoyssinus,d7: <0.1 kU/L
Allergen,Goose feathers, e70: <0.1 kU/L
Alternaria Alternata: <0.1 kU/L
Aspergillus fumigatus, m3: <0.1 kU/L
Bahia Grass: <0.1 kU/L
Box Elder IgE: <0.1 kU/L
Cat Dander: <0.1 kU/L
Common Ragweed: <0.1 kU/L
Dog Dander: <0.1 kU/L
Elm IgE: <0.1 kU/L
Fescue: <0.1 kU/L
G005 Rye, Perennial: <0.1 kU/L
G009 Red Top: <0.1 kU/L
Goldenrod: <0.1 kU/L
Helminthosporium halodes: <0.1 kU/L
House Dust Hollister: <0.1 kU/L
IgE (Immunoglobulin E), Serum: 3 kU/L (ref <115)
Stemphylium Botryosum: <0.1 kU/L

## 2014-03-30 NOTE — Progress Notes (Signed)
Please put orders in Epic surgery 04-05-14 pre op 04-01-14 Thanks

## 2014-03-31 ENCOUNTER — Encounter (HOSPITAL_COMMUNITY): Payer: Self-pay

## 2014-03-31 ENCOUNTER — Ambulatory Visit (INDEPENDENT_AMBULATORY_CARE_PROVIDER_SITE_OTHER): Payer: Self-pay | Admitting: Surgery

## 2014-03-31 NOTE — H&P (Signed)
Suzanne Schaefer. Weld 11/30/2013 3:35 PM Location: [facility] Patient #: [id number] DOB: 01-09-1961 Divorced / Language: [name] / Race: White Female  History of Present Illness Suzanne Key B. Hassell Done MD; 11/30/2013 4:23 PM) Patient words: consult for gastric surgery/sleeve.  The patient is a 54 year old female who presents for a bariatric surgery evaluation. Suzanne Schaefer has been contemplating bariatric surgery since 2006. She carries a BMI of 48 and a weight of 272 with a height of 5 feet 3 inches. She is followed by Dr. Alain Marion although her initial endorsement for bariatric surgery came from Dr. Adella Hare. She was tried numerous weight loss and physical activities and is lost up to 60 pounds at the time only to regain the weight. She is interested in a sleeve gastrectomy. I discussed this procedure with her in detail including palpitations no limited to sleeve lead bleedings. Has which is interested in. She may have a small hiatal hernia she does take a generic Prilosec. Her previous endoscopy showed a  small hiatal hernia. she's had a previous sleep study and used aCPAP machine for awhile.  Her UGI showed a small hiatal hernia with reflux.     Other Problems Suzanne Schaefer; 11/30/2013 3:35 PM) Anxiety Disorder Back Pain Gastroesophageal Reflux Disease General anesthesia - complications Sleep Apnea  Past Surgical History Suzanne Schaefer; 11/30/2013 3:35 PM) Anal Fissure Repair Breast Biopsy Left. Cesarean Section - Multiple Gallbladder Surgery - Laparoscopic Hysterectomy (not due to cancer) - Partial  Diagnostic Studies History Suzanne Schaefer; 11/30/2013 3:35 PM) Colonoscopy 5-10 years ago Mammogram within last year Pap Smear 1-5 years ago  Allergies Suzanne Schaefer; 11/30/2013 3:38 PM) No Known Drug Allergies10/27/2015  Medication History Suzanne Schaefer; 11/30/2013 3:39 PM) Eszopiclone (2MG  Tablet, Oral) Active. ROPINIRole HCl (0.25MG  Tablet,  Oral) Active. Pantoprazole Sodium (40MG  Tablet DR, Oral) Active. Calcium-Magnesium (500-250MG  Tablet, Oral) Active. ZyrTEC-D Allergy & Congestion (5-120MG  Tablet ER 12HR, Oral) Active. D3-1000 (1000UNIT Tablet, Oral) Active. MiraLax (Oral) Active. Flaxseed Oil Active.  Social History Suzanne Schaefer; 11/30/2013 3:35 PM) Alcohol use Occasional alcohol use. Caffeine use Carbonated beverages. No drug use Tobacco use Never smoker.  Family History Suzanne Schaefer; 11/30/2013 3:35 PM) Arthritis Father, Mother. Cancer Mother. Colon Polyps Mother. Hypertension Father. Respiratory Condition Mother. Thyroid problems Mother.  Pregnancy / Birth History Suzanne Schaefer; 11/30/2013 3:35 PM) Age at menarche 73 years. Age of menopause 46-50 Contraceptive History Oral contraceptives. Gravida 2 Irregular periods Maternal age 20-25 Para 2  Review of Systems Suzanne Schaefer; 11/30/2013 3:35 PM) General Present- Fatigue. Not Present- Appetite Loss, Chills, Fever, Night Sweats, Weight Gain and Weight Loss. Skin Not Present- Change in Wart/Mole, Dryness, Hives, Jaundice, New Lesions, Non-Healing Wounds, Rash and Ulcer. HEENT Present- Hoarseness, Seasonal Allergies, Sore Throat and Wears glasses/contact lenses. Not Present- Earache, Hearing Loss, Nose Bleed, Oral Ulcers, Ringing in the Ears, Sinus Pain, Visual Disturbances and Yellow Eyes. Respiratory Present- Snoring. Not Present- Bloody sputum, Chronic Cough, Difficulty Breathing and Wheezing. Breast Not Present- Breast Mass, Breast Pain, Nipple Discharge and Skin Changes. Cardiovascular Present- Leg Cramps and Swelling of Extremities. Not Present- Chest Pain, Difficulty Breathing Lying Down, Palpitations, Rapid Heart Rate and Shortness of Breath. Gastrointestinal Present- Indigestion. Not Present- Abdominal Pain, Bloating, Bloody Stool, Change in Bowel Habits, Chronic diarrhea, Constipation, Difficulty Swallowing,  Excessive gas, Gets full quickly at meals, Hemorrhoids, Nausea, Rectal Pain and Vomiting. Female Genitourinary Not Present- Frequency, Nocturia, Painful Urination, Pelvic Pain and Urgency. Musculoskeletal Present- Back Pain, Joint Pain and Joint Stiffness. Not Present- Muscle Pain, Muscle Weakness  and Swelling of Extremities. Neurological Present- Numbness. Not Present- Decreased Memory, Fainting, Headaches, Seizures, Tingling, Tremor, Trouble walking and Weakness. Psychiatric Present- Anxiety and Frequent crying. Not Present- Bipolar, Change in Sleep Pattern, Depression and Fearful. Hematology Present- Easy Bruising. Not Present- Excessive bleeding, Gland problems, HIV and Persistent Infections.   Vitals Suzanne Schaefer; 11/30/2013 3:44 PM) 11/30/2013 3:44 PM Weight: 272 lb Height: 63in Body Surface Area: 2.34 m Body Mass Index: 48.18 kg/m Temp.: 56F(Oral)  Pulse: 68 (Regular)  Resp.: 16 (Unlabored)  BP: 126/72 (Sitting, Left Arm, Standard)    Physical Exam (Suzanne Schaefer B. Hassell Done MD; 11/30/2013 4:25 PM) The physical exam findings are as follows: Note:HEENT unremarkable Neck supple chest clear to auscultation Heart sinus rhythm without murmurs or gallops Abdomen obese with multiplmytrocar scars from her laparoscopic cholecystectomy and also from cesarean sections. Extremities obesity and the proximal thighs. Full range of motion. Neuro alert and oriented x3. Motor for function and sensory function are grossly intact.    Assessment & Plan Suzanne Key B. Hassell Done MD; 11/30/2013 4:27 PM) MORBID OBESITY (278.01  E66.01) Impression: BMI 48; appropriate candidate for sleeve gastrectomy. Will proceed with sleeve gastrectomy and assess for hiatus hernia at the same time.   [name], MD, [facility] [phone] beeper [phone]  03/31/2014 12:58 PM

## 2014-03-31 NOTE — Patient Instructions (Addendum)
04-01-14 EKG/CXR 11'15 Epic.20 MANVI GUILLIAMS  04/01/2014   Your procedure is scheduled on:  3-1 -2016 Tuesday  Enter through [facility]  Entrance and follow signs to [facility]. Arrive at      0800  AM .  Call this number if you have problems the morning of surgery: [phone]  Or Presurgical Testing [phone].   For Living Will and/or Health Care Power Attorney Forms: please provide copy for your medical record,may bring AM of surgery(Forms should be already notarized -we do not provide this service).(04-01-14 Yes will provide AM of or scan into echart prior to arrival).  Remember: Follow any bowel prep instructions per MD office. For Cpap use: Bring mask and tubing only.   Do not eat food/ or drink: After Midnight.     Take these medicines the morning of surgery with A SIP OF WATER: Zyrtec. Pantoprazole. Use/bring Flonase spray.   Do not wear jewelry, make-up or nail polish.  Do not wear deodorant, lotions, powders, or perfumes.   Do not shave legs and under arms- 48 hours(2 days) prior to first CHG shower.(Shaving face and neck okay.)  Do not bring valuables to the hospital.(Hospital is not responsible for lost valuables).  Contacts, dentures or removable bridgework, body piercing, hair pins may not be worn into surgery.  Leave suitcase in the car. After surgery it may be brought to your room.  For patients admitted to the hospital, checkout time is 11:00 AM the day of discharge.(Restricted visitors-Any Persons displaying flu-like symptoms or illness).    Patients discharged the day of surgery will not be allowed to drive home. Must have responsible person with you x 24 hours once discharged.  Name and phone number of your driver: Inanna Telford [phone] cell     Please read over the following fact sheets that you were given:  CHG(Chlorhexidine Gluconate 4% Surgical Soap) use.           [facility] - Preparing for Surgery Before surgery, you can play  an important role.  Because skin is not sterile, your skin needs to be as free of germs as possible.  You can reduce the number of germs on your skin by washing with CHG (chlorahexidine gluconate) soap before surgery.  CHG is an antiseptic cleaner which kills germs and bonds with the skin to continue killing germs even after washing. Please DO NOT use if you have an allergy to CHG or antibacterial soaps.  If your skin becomes reddened/irritated stop using the CHG and inform your nurse when you arrive at Short Stay. Do not shave (including legs and underarms) for at least 48 hours prior to the first CHG shower.  You may shave your face/neck. Please follow these instructions carefully:  1.  Shower with CHG Soap the night before surgery and the  morning of Surgery.  2.  If you choose to wash your hair, wash your hair first as usual with your  normal  shampoo.  3.  After you shampoo, rinse your hair and body thoroughly to remove the  shampoo.                           4.  Use CHG as you would any other liquid soap.  You can apply chg directly  to the skin and wash                       Gently  with a scrungie or clean washcloth.  5.  Apply the CHG Soap to your body ONLY FROM THE NECK DOWN.   Do not use on face/ open                           Wound or open sores. Avoid contact with eyes, ears mouth and genitals (private parts).                       Wash face,  Genitals (private parts) with your normal soap.             6.  Wash thoroughly, paying special attention to the area where your surgery  will be performed.  7.  Thoroughly rinse your body with warm water from the neck down.  8.  DO NOT shower/wash with your normal soap after using and rinsing off  the CHG Soap.                9.  Pat yourself dry with a clean towel.            10.  Wear clean pajamas.            11.  Place clean sheets on your bed the night of your first shower and do not  sleep with pets. Day of Surgery : Do not apply any  lotions/deodorants the morning of surgery.  Please wear clean clothes to the hospital/surgery center.  FAILURE TO FOLLOW THESE INSTRUCTIONS MAY RESULT IN THE CANCELLATION OF YOUR SURGERY PATIENT SIGNATURE_________________________________  NURSE SIGNATURE__________________________________  ________________________________________________________________________

## 2014-04-01 ENCOUNTER — Encounter (HOSPITAL_COMMUNITY): Payer: Self-pay

## 2014-04-01 ENCOUNTER — Encounter (HOSPITAL_COMMUNITY)
Admission: RE | Admit: 2014-04-01 | Discharge: 2014-04-01 | Disposition: A | Discharge status: Home / Self Care | Payer: 59 | Source: Ambulatory Visit | Attending: Surgery | Admitting: Surgery

## 2014-04-01 DIAGNOSIS — Z01818 Encounter for other preprocedural examination: Secondary | ICD-10-CM | POA: Diagnosis present

## 2014-04-01 HISTORY — DX: Adverse effect of unspecified anesthetic, initial encounter: T41.45XA

## 2014-04-01 HISTORY — DX: Nausea with vomiting, unspecified: R11.2

## 2014-04-01 HISTORY — DX: Unspecified osteoarthritis, unspecified site: M19.90

## 2014-04-01 HISTORY — DX: Other complications of anesthesia, initial encounter: T88.59XA

## 2014-04-01 HISTORY — DX: Gastro-esophageal reflux disease without esophagitis: K21.9

## 2014-04-01 HISTORY — DX: Other specified postprocedural states: Z98.890

## 2014-04-01 LAB — COMPREHENSIVE METABOLIC PANEL
ALT: 19 U/L (ref 0–35)
AST: 53 U/L — AB (ref 0–37)
Albumin: 4.4 g/dL (ref 3.5–5.2)
Alkaline Phosphatase: 119 U/L — ABNORMAL HIGH (ref 39–117)
Anion gap: 9 (ref 5–15)
BUN: 24 mg/dL — ABNORMAL HIGH (ref 6–23)
CALCIUM: 9.8 mg/dL (ref 8.4–10.5)
CO2: 27 mmol/L (ref 19–32)
Chloride: 103 mmol/L (ref 96–112)
Creatinine, Ser: 0.68 mg/dL (ref 0.50–1.10)
GFR calc Af Amer: >90 mL/min (ref >90)
GFR calc non Af Amer: >90 mL/min (ref >90)
Glucose, Bld: 94 mg/dL (ref 70–99)
Potassium: 4.3 mmol/L (ref 3.5–5.1)
Sodium: 139 mmol/L (ref 135–145)
TOTAL PROTEIN: 8.2 g/dL (ref 6.0–8.3)
Total Bilirubin: 0.6 mg/dL (ref 0.3–1.2)

## 2014-04-01 LAB — CBC WITH DIFFERENTIAL/PLATELET
Basophils Absolute: 0 10*3/uL (ref 0.0–0.1)
Basophils Relative: 0 % (ref 0–1)
EOS ABS: 0.1 10*3/uL (ref 0.0–0.7)
EOS PCT: 1 % (ref 0–5)
HCT: 39.3 % (ref 36.0–46.0)
HEMOGLOBIN: 12.8 g/dL (ref 12.0–15.0)
LYMPHS PCT: 23 % (ref 12–46)
Lymphs Abs: 1.9 10*3/uL (ref 0.7–4.0)
MCH: 27.1 pg (ref 26.0–34.0)
MCHC: 32.6 g/dL (ref 30.0–36.0)
MCV: 83.3 fL (ref 78.0–100.0)
MONO ABS: 0.6 10*3/uL (ref 0.1–1.0)
MONOS PCT: 7 % (ref 3–12)
NEUTROS ABS: 5.8 10*3/uL (ref 1.7–7.7)
NEUTROS PCT: 69 % (ref 43–77)
Platelets: 251 10*3/uL (ref 150–400)
RBC: 4.72 MIL/uL (ref 3.87–5.11)
RDW: 14.2 % (ref 11.5–15.5)
WBC: 8.3 10*3/uL (ref 4.0–10.5)

## 2014-04-01 NOTE — Progress Notes (Signed)
CMP done 04/01/14 faxed via EPIC to Dr Hassell Done.

## 2014-04-01 NOTE — Pre-Procedure Instructions (Signed)
04-01-14 EKG/ CXR Epic.

## 2014-04-05 ENCOUNTER — Inpatient Hospital Stay (HOSPITAL_COMMUNITY): Payer: 59 | Admitting: Certified Registered Nurse Anesthetist

## 2014-04-05 ENCOUNTER — Encounter (HOSPITAL_COMMUNITY)
Admission: RE | Disposition: A | Discharge status: Home / Self Care | Payer: Self-pay | Source: Ambulatory Visit | Attending: Surgery

## 2014-04-05 ENCOUNTER — Inpatient Hospital Stay (HOSPITAL_COMMUNITY)
Admission: RE | Admit: 2014-04-05 | Discharge: 2014-04-07 | DRG: 621 | Disposition: A | Discharge status: Home / Self Care | Payer: 59 | Source: Ambulatory Visit | Attending: Surgery | Admitting: Surgery

## 2014-04-05 ENCOUNTER — Encounter (HOSPITAL_COMMUNITY): Payer: Self-pay | Admitting: *Deleted

## 2014-04-05 DIAGNOSIS — K449 Diaphragmatic hernia without obstruction or gangrene: Secondary | ICD-10-CM | POA: Diagnosis present

## 2014-04-05 DIAGNOSIS — F419 Anxiety disorder, unspecified: Secondary | ICD-10-CM | POA: Diagnosis present

## 2014-04-05 DIAGNOSIS — Z9884 Bariatric surgery status: Secondary | ICD-10-CM

## 2014-04-05 DIAGNOSIS — Z90710 Acquired absence of both cervix and uterus: Secondary | ICD-10-CM

## 2014-04-05 DIAGNOSIS — Z6841 Body Mass Index (BMI) 40.0 and over, adult: Secondary | ICD-10-CM | POA: Diagnosis not present

## 2014-04-05 DIAGNOSIS — G4730 Sleep apnea, unspecified: Secondary | ICD-10-CM | POA: Diagnosis present

## 2014-04-05 DIAGNOSIS — K219 Gastro-esophageal reflux disease without esophagitis: Secondary | ICD-10-CM | POA: Diagnosis present

## 2014-04-05 HISTORY — PX: UPPER GI ENDOSCOPY: SHX6162

## 2014-04-05 HISTORY — PX: LAPAROSCOPIC GASTRIC SLEEVE RESECTION: SHX5895

## 2014-04-05 LAB — CBC
HCT: 37.3 % (ref 36.0–46.0)
HEMOGLOBIN: 12.1 g/dL (ref 12.0–15.0)
MCH: 27.5 pg (ref 26.0–34.0)
MCHC: 32.4 g/dL (ref 30.0–36.0)
MCV: 84.8 fL (ref 78.0–100.0)
Platelets: 235 10*3/uL (ref 150–400)
RBC: 4.4 MIL/uL (ref 3.87–5.11)
RDW: 14.5 % (ref 11.5–15.5)
WBC: 15.4 10*3/uL — ABNORMAL HIGH (ref 4.0–10.5)

## 2014-04-05 LAB — CREATININE, SERUM
Creatinine, Ser: 0.71 mg/dL (ref 0.50–1.10)
GFR calc Af Amer: >90 mL/min (ref >90)

## 2014-04-05 LAB — HEMOGLOBIN AND HEMATOCRIT, BLOOD
HCT: 37 % (ref 36.0–46.0)
HEMOGLOBIN: 12.1 g/dL (ref 12.0–15.0)

## 2014-04-05 LAB — PREGNANCY, URINE: Preg Test, Ur: NEGATIVE

## 2014-04-05 SURGERY — GASTRECTOMY, SLEEVE, LAPAROSCOPIC
Anesthesia: General

## 2014-04-05 MED ORDER — BUPIVACAINE LIPOSOME 1.3 % IJ SUSP
20.0000 mL | Freq: Once | INTRAMUSCULAR | Status: AC
  Administered 2014-04-05: 20 mL
  Filled 2014-04-05: qty 20

## 2014-04-05 MED ORDER — CETYLPYRIDINIUM CHLORIDE 0.05 % MT LIQD
7.0000 mL | Freq: Two times a day (BID) | OROMUCOSAL | Status: DC
  Administered 2014-04-05 – 2014-04-06 (×2): 7 mL via OROMUCOSAL

## 2014-04-05 MED ORDER — CHLORHEXIDINE GLUCONATE 0.12 % MT SOLN
15.0000 mL | Freq: Two times a day (BID) | OROMUCOSAL | Status: DC
  Administered 2014-04-05 – 2014-04-06 (×3): 15 mL via OROMUCOSAL
  Filled 2014-04-05 (×5): qty 15

## 2014-04-05 MED ORDER — UNJURY CHOCOLATE CLASSIC POWDER: 2.0000 [oz_av] | Freq: Four times a day (QID) | ORAL | Status: DC

## 2014-04-05 MED ORDER — ROCURONIUM BROMIDE 100 MG/10ML IV SOLN
INTRAVENOUS | Status: DC | PRN
  Administered 2014-04-05: 40 mg via INTRAVENOUS
  Administered 2014-04-05: 5 mg via INTRAVENOUS
  Administered 2014-04-05: 10 mg via INTRAVENOUS

## 2014-04-05 MED ORDER — FENTANYL CITRATE 0.05 MG/ML IJ SOLN
INTRAMUSCULAR | Status: AC
Start: 2014-04-05 — End: 2014-04-05
  Filled 2014-04-05: qty 2

## 2014-04-05 MED ORDER — PANTOPRAZOLE SODIUM 40 MG IV SOLR
40.0000 mg | Freq: Every day | INTRAVENOUS | Status: DC
  Administered 2014-04-05 – 2014-04-06 (×2): 40 mg via INTRAVENOUS
  Filled 2014-04-05 (×3): qty 40

## 2014-04-05 MED ORDER — DEXAMETHASONE SODIUM PHOSPHATE 10 MG/ML IJ SOLN
INTRAMUSCULAR | Status: AC
  Filled 2014-04-05: qty 1

## 2014-04-05 MED ORDER — ROCURONIUM BROMIDE 100 MG/10ML IV SOLN
INTRAVENOUS | Status: AC
  Filled 2014-04-05: qty 1

## 2014-04-05 MED ORDER — ONDANSETRON HCL 4 MG/2ML IJ SOLN
4.0000 mg | INTRAMUSCULAR | Status: DC | PRN
Start: 2014-04-05 — End: 2014-04-07
  Administered 2014-04-05 – 2014-04-06 (×2): 4 mg via INTRAVENOUS
  Filled 2014-04-05 (×2): qty 2

## 2014-04-05 MED ORDER — FENTANYL CITRATE 0.05 MG/ML IJ SOLN
INTRAMUSCULAR | Status: DC | PRN
  Administered 2014-04-05: 50 ug via INTRAVENOUS
  Administered 2014-04-05: 100 ug via INTRAVENOUS
  Administered 2014-04-05 (×2): 50 ug via INTRAVENOUS

## 2014-04-05 MED ORDER — MIDAZOLAM HCL 5 MG/5ML IJ SOLN
INTRAMUSCULAR | Status: DC | PRN
  Administered 2014-04-05: 2 mg via INTRAVENOUS

## 2014-04-05 MED ORDER — DEXTROSE 5 % IV SOLN
2.0000 g | INTRAVENOUS | Status: AC
  Administered 2014-04-05: 2 g via INTRAVENOUS

## 2014-04-05 MED ORDER — PROPOFOL 10 MG/ML IV BOLUS
INTRAVENOUS | Status: DC | PRN
  Administered 2014-04-05: 200 mg via INTRAVENOUS

## 2014-04-05 MED ORDER — GLYCOPYRROLATE 0.2 MG/ML IJ SOLN
INTRAMUSCULAR | Status: DC | PRN
  Administered 2014-04-05: 0.6 mg via INTRAVENOUS

## 2014-04-05 MED ORDER — CHLORHEXIDINE GLUCONATE CLOTH 2 % EX PADS: 6.0000 | MEDICATED_PAD | Freq: Once | CUTANEOUS | Status: DC

## 2014-04-05 MED ORDER — HYDROMORPHONE HCL 1 MG/ML IJ SOLN
INTRAMUSCULAR | Status: AC
  Filled 2014-04-05: qty 1

## 2014-04-05 MED ORDER — UNJURY CHICKEN SOUP POWDER: 2.0000 [oz_av] | Freq: Four times a day (QID) | ORAL | Status: DC

## 2014-04-05 MED ORDER — HEPARIN SODIUM (PORCINE) 5000 UNIT/ML IJ SOLN
5000.0000 [IU] | INTRAMUSCULAR | Status: AC
  Administered 2014-04-05: 5000 [IU] via SUBCUTANEOUS
  Filled 2014-04-05: qty 1

## 2014-04-05 MED ORDER — NEOSTIGMINE METHYLSULFATE 10 MG/10ML IV SOLN
INTRAVENOUS | Status: DC | PRN
  Administered 2014-04-05: 5 mg via INTRAVENOUS

## 2014-04-05 MED ORDER — ACETAMINOPHEN 160 MG/5ML PO SOLN: 325.0000 mg | ORAL | Status: DC | PRN

## 2014-04-05 MED ORDER — ACETAMINOPHEN 160 MG/5ML PO SOLN
650.0000 mg | ORAL | Status: DC | PRN
  Administered 2014-04-06 – 2014-04-07 (×2): 650 mg via ORAL
  Filled 2014-04-05 (×2): qty 20.3

## 2014-04-05 MED ORDER — KCL IN DEXTROSE-NACL 20-5-0.45 MEQ/L-%-% IV SOLN
INTRAVENOUS | Status: DC
  Administered 2014-04-05: 17:00:00 via INTRAVENOUS
  Administered 2014-04-06: 100 mL via INTRAVENOUS
  Administered 2014-04-06: 11:00:00 via INTRAVENOUS
  Administered 2014-04-06: 1000 mL via INTRAVENOUS
  Administered 2014-04-07: 100 mL via INTRAVENOUS
  Filled 2014-04-05 (×6): qty 1000

## 2014-04-05 MED ORDER — OXYCODONE HCL 5 MG/5ML PO SOLN
5.0000 mg | ORAL | Status: DC | PRN
  Administered 2014-04-06: 5 mg via ORAL
  Filled 2014-04-05: qty 5

## 2014-04-05 MED ORDER — PROPOFOL 10 MG/ML IV BOLUS
INTRAVENOUS | Status: AC
  Filled 2014-04-05: qty 20

## 2014-04-05 MED ORDER — ONDANSETRON HCL 4 MG/2ML IJ SOLN
INTRAMUSCULAR | Status: DC | PRN
  Administered 2014-04-05: 4 mg via INTRAVENOUS

## 2014-04-05 MED ORDER — FENTANYL CITRATE 0.05 MG/ML IJ SOLN
INTRAMUSCULAR | Status: AC
  Filled 2014-04-05: qty 5

## 2014-04-05 MED ORDER — ONDANSETRON HCL 4 MG/2ML IJ SOLN
INTRAMUSCULAR | Status: AC
  Filled 2014-04-05: qty 2

## 2014-04-05 MED ORDER — SUCCINYLCHOLINE CHLORIDE 20 MG/ML IJ SOLN
INTRAMUSCULAR | Status: DC | PRN
  Administered 2014-04-05: 140 mg via INTRAVENOUS

## 2014-04-05 MED ORDER — LACTATED RINGERS IV SOLN
INTRAVENOUS | Status: DC
  Administered 2014-04-05: 11:00:00 via INTRAVENOUS
  Administered 2014-04-05: 1000 mL via INTRAVENOUS
  Administered 2014-04-05: 12:00:00 via INTRAVENOUS

## 2014-04-05 MED ORDER — LIDOCAINE HCL (CARDIAC) 20 MG/ML IV SOLN
INTRAVENOUS | Status: AC
  Filled 2014-04-05: qty 5

## 2014-04-05 MED ORDER — DEXAMETHASONE SODIUM PHOSPHATE 10 MG/ML IJ SOLN
INTRAMUSCULAR | Status: DC | PRN
  Administered 2014-04-05: 10 mg via INTRAVENOUS

## 2014-04-05 MED ORDER — HYDROMORPHONE HCL 1 MG/ML IJ SOLN
0.2500 mg | INTRAMUSCULAR | Status: DC | PRN
  Administered 2014-04-05 (×2): 0.25 mg via INTRAVENOUS

## 2014-04-05 MED ORDER — SODIUM CHLORIDE 0.9 % IJ SOLN
INTRAMUSCULAR | Status: AC
  Filled 2014-04-05: qty 10

## 2014-04-05 MED ORDER — MIDAZOLAM HCL 2 MG/2ML IJ SOLN
INTRAMUSCULAR | Status: AC
  Filled 2014-04-05: qty 2

## 2014-04-05 MED ORDER — 0.9 % SODIUM CHLORIDE (POUR BTL) OPTIME
TOPICAL | Status: DC | PRN
  Administered 2014-04-05: 1000 mL

## 2014-04-05 MED ORDER — LACTATED RINGERS IV SOLN: INTRAVENOUS | Status: DC

## 2014-04-05 MED ORDER — LIDOCAINE HCL (CARDIAC) 20 MG/ML IV SOLN
INTRAVENOUS | Status: DC | PRN
  Administered 2014-04-05: 100 mg via INTRAVENOUS

## 2014-04-05 MED ORDER — SODIUM CHLORIDE 0.9 % IJ SOLN
INTRAMUSCULAR | Status: DC | PRN
  Administered 2014-04-05: 10 mL

## 2014-04-05 MED ORDER — UNJURY VANILLA POWDER
2.0000 [oz_av] | Freq: Four times a day (QID) | ORAL | Status: DC
  Administered 2014-04-07: 2 [oz_av] via ORAL

## 2014-04-05 MED ORDER — DEXTROSE 5 % IV SOLN
INTRAVENOUS | Status: AC
  Filled 2014-04-05: qty 2

## 2014-04-05 MED ORDER — HEPARIN SODIUM (PORCINE) 5000 UNIT/ML IJ SOLN
5000.0000 [IU] | Freq: Three times a day (TID) | INTRAMUSCULAR | Status: DC
  Administered 2014-04-05 – 2014-04-07 (×5): 5000 [IU] via SUBCUTANEOUS
  Filled 2014-04-05 (×8): qty 1

## 2014-04-05 MED ORDER — LACTATED RINGERS IR SOLN
Status: DC | PRN
  Administered 2014-04-05: 1

## 2014-04-05 MED ORDER — MORPHINE SULFATE 2 MG/ML IJ SOLN
2.0000 mg | INTRAMUSCULAR | Status: DC | PRN
  Administered 2014-04-05 (×2): 6 mg via INTRAVENOUS
  Administered 2014-04-05: 4 mg via INTRAVENOUS
  Administered 2014-04-06 (×2): 6 mg via INTRAVENOUS
  Filled 2014-04-05 (×2): qty 3
  Filled 2014-04-05: qty 2
  Filled 2014-04-05 (×2): qty 3

## 2014-04-05 SURGICAL SUPPLY — 65 items
APL SRG 32X5 SNPLK LF DISP (MISCELLANEOUS)
APPLICATOR COTTON TIP 6IN STRL (MISCELLANEOUS) ×1 IMPLANT
APPLIER CLIP 5 13 M/L LIGAMAX5 (MISCELLANEOUS)
APPLIER CLIP ROT 10 11.4 M/L (STAPLE)
APPLIER CLIP ROT 13.4 12 LRG (CLIP)
APR CLP LRG 13.4X12 ROT 20 MLT (CLIP)
APR CLP MED LRG 11.4X10 (STAPLE)
APR CLP MED LRG 5 ANG JAW (MISCELLANEOUS)
BLADE SURG 15 STRL LF DISP TIS (BLADE) ×2 IMPLANT
BLADE SURG 15 STRL SS (BLADE) ×3
CABLE HIGH FREQUENCY MONO STRZ (ELECTRODE) ×1 IMPLANT
CLIP APPLIE 5 13 M/L LIGAMAX5 (MISCELLANEOUS) IMPLANT
CLIP APPLIE ROT 10 11.4 M/L (STAPLE) IMPLANT
CLIP APPLIE ROT 13.4 12 LRG (CLIP) IMPLANT
DEVICE SUT QUICK LOAD TK 5 (STAPLE) ×4 IMPLANT
DEVICE SUT TI-KNOT TK 5X26 (MISCELLANEOUS) ×1 IMPLANT
DEVICE SUTURE ENDOST 10MM (ENDOMECHANICALS) ×1 IMPLANT
DEVICE TROCAR PUNCTURE CLOSURE (ENDOMECHANICALS) ×3 IMPLANT
DISSECTOR BLUNT TIP ENDO 5MM (MISCELLANEOUS) ×3 IMPLANT
DRAPE CAMERA CLOSED 9X96 (DRAPES) ×3 IMPLANT
ELECT REM PT RETURN 9FT ADLT (ELECTROSURGICAL) ×3
ELECTRODE REM PT RTRN 9FT ADLT (ELECTROSURGICAL) ×2 IMPLANT
GAUZE PACKING IODOFORM 1/4X15 (GAUZE/BANDAGES/DRESSINGS) ×1 IMPLANT
GAUZE SPONGE 4X4 12PLY STRL (GAUZE/BANDAGES/DRESSINGS) IMPLANT
GLOVE BIOGEL M 8.0 STRL (GLOVE) ×3 IMPLANT
GOWN STRL REUS W/TWL XL LVL3 (GOWN DISPOSABLE) ×12 IMPLANT
HANDLE STAPLE EGIA 4 XL (STAPLE) ×3 IMPLANT
HOVERMATT SINGLE USE (MISCELLANEOUS) ×3 IMPLANT
KIT BASIN OR (CUSTOM PROCEDURE TRAY) ×3 IMPLANT
LIQUID BAND (GAUZE/BANDAGES/DRESSINGS) ×1 IMPLANT
NDL SPNL 22GX3.5 QUINCKE BK (NEEDLE) ×2 IMPLANT
NEEDLE SPNL 22GX3.5 QUINCKE BK (NEEDLE) ×3 IMPLANT
PACK UNIVERSAL I (CUSTOM PROCEDURE TRAY) ×3 IMPLANT
PEN SKIN MARKING BROAD (MISCELLANEOUS) ×3 IMPLANT
RELOAD STAPLE 45 PURP MED/THCK (STAPLE) IMPLANT
RELOAD TRI 45 ART MED THCK BLK (STAPLE) ×3 IMPLANT
RELOAD TRI 45 ART MED THCK PUR (STAPLE) ×3 IMPLANT
RELOAD TRI 60 ART MED THCK BLK (STAPLE) ×3 IMPLANT
RELOAD TRI 60 ART MED THCK PUR (STAPLE) ×5 IMPLANT
SCISSORS LAP 5X45 EPIX DISP (ENDOMECHANICALS) ×1 IMPLANT
SCRUB PCMX 4 OZ (MISCELLANEOUS) ×5 IMPLANT
SEALANT SURGICAL APPL DUAL CAN (MISCELLANEOUS) IMPLANT
SET IRRIG TUBING LAPAROSCOPIC (IRRIGATION / IRRIGATOR) ×3 IMPLANT
SHEARS CURVED HARMONIC AC 45CM (MISCELLANEOUS) ×3 IMPLANT
SLEEVE ADV FIXATION 5X100MM (TROCAR) ×6 IMPLANT
SLEEVE GASTRECTOMY 36FR VISIGI (MISCELLANEOUS) ×3 IMPLANT
SOLUTION ANTI FOG 6CC (MISCELLANEOUS) ×3 IMPLANT
SPONGE LAP 18X18 X RAY DECT (DISPOSABLE) ×3 IMPLANT
STAPLER VISISTAT 35W (STAPLE) ×3 IMPLANT
SUT SURGIDAC NAB ES-9 0 48 120 (SUTURE) ×4 IMPLANT
SUT VIC AB 4-0 SH 18 (SUTURE) ×3 IMPLANT
SUT VICRYL 0 TIES 12 18 (SUTURE) ×3 IMPLANT
SYR 20CC LL (SYRINGE) ×3 IMPLANT
SYR 50ML LL SCALE MARK (SYRINGE) ×3 IMPLANT
TOWEL OR 17X26 10 PK STRL BLUE (TOWEL DISPOSABLE) ×6 IMPLANT
TOWEL OR NON WOVEN STRL DISP B (DISPOSABLE) ×3 IMPLANT
TRAY FOLEY CATH 14FRSI W/METER (CATHETERS) IMPLANT
TROCAR ADV FIXATION 12X100MM (TROCAR) ×3 IMPLANT
TROCAR ADV FIXATION 5X100MM (TROCAR) ×3 IMPLANT
TROCAR BLADELESS 15MM (ENDOMECHANICALS) ×3 IMPLANT
TROCAR BLADELESS OPT 5 100 (ENDOMECHANICALS) ×3 IMPLANT
TUBE CALIBRATION LAPBAND (TUBING) IMPLANT
TUBING CONNECTING 10 (TUBING) ×3 IMPLANT
TUBING ENDO SMARTCAP (MISCELLANEOUS) ×3 IMPLANT
TUBING FILTER THERMOFLATOR (ELECTROSURGICAL) ×3 IMPLANT

## 2014-04-05 NOTE — Anesthesia Postprocedure Evaluation (Signed)
  Anesthesia Post-op Note  Patient: Suzanne Schaefer  Procedure(s) Performed: Procedure(s) (LRB): LAPAROSCOPIC GASTRIC SLEEVE RESECTION with hiatel hernia repair (N/A) UPPER GI ENDOSCOPY  Patient Location: PACU  Anesthesia Type: General  Level of Consciousness: awake and alert   Airway and Oxygen Therapy: Patient Spontanous Breathing  Post-op Pain: mild  Post-op Assessment: Post-op Vital signs reviewed, Patient's Cardiovascular Status Stable, Respiratory Function Stable, Patent Airway and No signs of Nausea or vomiting  Last Vitals:  Filed Vitals:   04/05/14 1300  BP: 143/82  Pulse:   Temp:   Resp:     Post-op Vital Signs: stable   Complications: No apparent anesthesia complications

## 2014-04-05 NOTE — H&P (View-Only) (Signed)
Suzanne Schaefer. Suzanne Schaefer 11/30/2013 3:35 PM Location: [facility] Patient #: [id number] DOB: [birth year] Divorced / Language: Suzanne Schaefer / Race: White Female  History of Present Illness Suzanne Key B. Hassell Done MD; 11/30/2013 4:23 PM) Patient words: consult for gastric surgery/sleeve.  The patient is a 54 year old female who presents for a bariatric surgery evaluation. Suzanne Schaefer has been contemplating bariatric surgery since 2006. She carries a BMI of 48 and a weight of 272 with a height of 5 feet 3 inches. She is followed by Dr. Alain Schaefer although her initial endorsement for bariatric surgery came from Dr. Adella Schaefer. She was tried numerous weight loss and physical activities and is lost up to 60 pounds at the time only to regain the weight. She is interested in a sleeve gastrectomy. I discussed this procedure with her in detail including palpitations no limited to sleeve lead bleedings. Has which is interested in. She may have a small hiatal hernia she does take a generic Prilosec. Her previous endoscopy showed a  small hiatal hernia. she's had a previous sleep study and used aCPAP machine for awhile.  Her UGI showed a small hiatal hernia with reflux.     Other Problems Suzanne Schaefer; 11/30/2013 3:35 PM) Anxiety Disorder Back Pain Gastroesophageal Reflux Disease General anesthesia - complications Sleep Apnea  Past Surgical History Suzanne Schaefer; 11/30/2013 3:35 PM) Anal Fissure Repair Breast Biopsy Left. Cesarean Section - Multiple Gallbladder Surgery - Laparoscopic Hysterectomy (not due to cancer) - Partial  Diagnostic Studies History Suzanne Schaefer; 11/30/2013 3:35 PM) Colonoscopy 5-10 years ago Mammogram within last year Pap Smear 1-5 years ago  Allergies Suzanne Schaefer; 11/30/2013 3:38 PM) No Known Drug Allergies10/27/2015  Medication History Suzanne Schaefer; 11/30/2013 3:39 PM) Eszopiclone (2MG  Tablet, Oral) Active. ROPINIRole HCl (0.25MG  Tablet,  Oral) Active. Pantoprazole Sodium (40MG  Tablet DR, Oral) Active. Calcium-Magnesium (500-250MG  Tablet, Oral) Active. ZyrTEC-D Allergy & Congestion (5-120MG  Tablet ER 12HR, Oral) Active. D3-1000 (1000UNIT Tablet, Oral) Active. MiraLax (Oral) Active. Flaxseed Oil Active.  Social History Suzanne Schaefer; 11/30/2013 3:35 PM) Alcohol use Occasional alcohol use. Caffeine use Carbonated beverages. No drug use Tobacco use Never smoker.  Family History Suzanne Schaefer; 11/30/2013 3:35 PM) Arthritis Father, Mother. Cancer Mother. Colon Polyps Mother. Hypertension Father. Respiratory Condition Mother. Thyroid problems Mother.  Pregnancy / Birth History Suzanne Schaefer; 11/30/2013 3:35 PM) Age at menarche 3 years. Age of menopause [age] Contraceptive History Oral contraceptives. Gravida 2 Irregular periods Maternal age 20-25 Para 2  Review of Systems Suzanne Schaefer; 11/30/2013 3:35 PM) General Present- Fatigue. Not Present- Appetite Loss, Chills, Fever, Night Sweats, Weight Gain and Weight Loss. Skin Not Present- Change in Wart/Mole, Dryness, Hives, Jaundice, New Lesions, Non-Healing Wounds, Rash and Ulcer. HEENT Present- Hoarseness, Seasonal Allergies, Sore Throat and Wears glasses/contact lenses. Not Present- Earache, Hearing Loss, Nose Bleed, Oral Ulcers, Ringing in the Ears, Sinus Pain, Visual Disturbances and Yellow Eyes. Respiratory Present- Snoring. Not Present- Bloody sputum, Chronic Cough, Difficulty Breathing and Wheezing. Breast Not Present- Breast Mass, Breast Pain, Nipple Discharge and Skin Changes. Cardiovascular Present- Leg Cramps and Swelling of Extremities. Not Present- Chest Pain, Difficulty Breathing Lying Down, Palpitations, Rapid Heart Rate and Shortness of Breath. Gastrointestinal Present- Indigestion. Not Present- Abdominal Pain, Bloating, Bloody Stool, Change in Bowel Habits, Chronic diarrhea, Constipation, Difficulty Swallowing,  Excessive gas, Gets full quickly at meals, Hemorrhoids, Nausea, Rectal Pain and Vomiting. Female Genitourinary Not Present- Frequency, Nocturia, Painful Urination, Pelvic Pain and Urgency. Musculoskeletal Present- Back Pain, Joint Pain and Joint Stiffness. Not Present- Muscle Pain, Muscle Weakness  and Swelling of Extremities. Neurological Present- Numbness. Not Present- Decreased Memory, Fainting, Headaches, Seizures, Tingling, Tremor, Trouble walking and Weakness. Psychiatric Present- Anxiety and Frequent crying. Not Present- Bipolar, Change in Sleep Pattern, Depression and Fearful. Hematology Present- Easy Bruising. Not Present- Excessive bleeding, Gland problems, HIV and Persistent Infections.   Vitals Suzanne Schaefer [place]; 11/30/2013 3:44 PM) 11/30/2013 3:44 PM Weight: 272 lb Height: 63in Body Surface Area: 2.34 m Body Mass Index: 48.18 kg/m Temp.: 10F(Oral)  Pulse: 68 (Regular)  Resp.: 16 (Unlabored)  BP: 126/72 (Sitting, Left Arm, Standard)    Physical Exam (Suzanne Schaefer B. Hassell Done MD; 11/30/2013 4:25 PM) The physical exam findings are as follows: Note:HEENT unremarkable Neck supple chest clear to auscultation Heart sinus rhythm without murmurs or gallops Abdomen obese with multiplmytrocar scars from her laparoscopic cholecystectomy and also from cesarean sections. Extremities obesity and the proximal thighs. Full range of motion. Neuro alert and oriented x3. Motor for function and sensory function are grossly intact.    Assessment & Plan Suzanne Key B. Hassell Done MD; 11/30/2013 4:27 PM) MORBID OBESITY (278.01  E66.01) Impression: BMI 48; appropriate candidate for sleeve gastrectomy. Will proceed with sleeve gastrectomy and assess for hiatus hernia at the same time.   [name], MD, [facility] [phone] beeper [phone]  03/31/2014 12:58 PM

## 2014-04-05 NOTE — Transfer of Care (Signed)
Immediate Anesthesia Transfer of Care Note  Patient: Suzanne Schaefer  Procedure(s) Performed: Procedure(s) (LRB): LAPAROSCOPIC GASTRIC SLEEVE RESECTION with hiatel hernia repair (N/A) UPPER GI ENDOSCOPY  Patient Location: PACU  Anesthesia Type: General  Level of Consciousness: sedated, patient cooperative and responds to stimulation  Airway & Oxygen Therapy: Patient Spontanous Breathing and Patient connected to face mask oxgen  Post-op Assessment: Report given to PACU RN and Post -op Vital signs reviewed and stable  Post vital signs: Reviewed and stable  Complications: No apparent anesthesia complications

## 2014-04-05 NOTE — Anesthesia Preprocedure Evaluation (Addendum)
Anesthesia Evaluation  Patient identified by MRN, date of birth, ID band Patient awake    Reviewed: Allergy & Precautions, H&P , NPO status , Patient's Chart, lab work & pertinent test results  History of Anesthesia Complications (+) PONV  Airway Mallampati: II  TM Distance: >3 FB Neck ROM: full    Dental no notable dental hx. (+) Teeth Intact, Dental Advisory Given   Pulmonary sleep apnea ,  breath sounds clear to auscultation  Pulmonary exam normal       Cardiovascular Exercise Tolerance: Good negative cardio ROS  Rhythm:regular Rate:Normal     Neuro/Psych negative neurological ROS  negative psych ROS   GI/Hepatic negative GI ROS, Neg liver ROS, GERD-  Medicated and Controlled,  Endo/Other  negative endocrine ROSMorbid obesity  Renal/GU negative Renal ROS  negative genitourinary   Musculoskeletal   Abdominal (+) + obese,   Peds  Hematology negative hematology ROS (+)   Anesthesia Other Findings   Reproductive/Obstetrics negative OB ROS                             Anesthesia Physical Anesthesia Plan  ASA: III  Anesthesia Plan: General   Post-op Pain Management:    Induction: Intravenous  Airway Management Planned: Oral ETT  Additional Equipment:   Intra-op Plan:   Post-operative Plan: Extubation in OR  Informed Consent: I have reviewed the patients History and Physical, chart, labs and discussed the procedure including the risks, benefits and alternatives for the proposed anesthesia with the patient or authorized representative who has indicated his/her understanding and acceptance.   Dental Advisory Given  Plan Discussed with: CRNA and Surgeon  Anesthesia Plan Comments:         Anesthesia Quick Evaluation

## 2014-04-05 NOTE — Op Note (Signed)
Name:  Suzanne Schaefer MRN: [id number] Date of Surgery: 04/05/2014  Preop Diagnosis:  Morbid Obesity  Postop Diagnosis:  Morbid Obesity (Weight - 272, BMI - 48.2) , S/P Gastric Sleeve  Procedure:  Upper endoscopy  (Intraoperative)  Surgeon:  Alphonsa Overall, M.D.  Anesthesia:  GET  Indications for procedure: [name] is a 54 y.o. female whose primary care physician is Mogensen Kehr, MD and has completed a Gastric Sleeve today by Dr. Hassell Done.  I am doing an intraoperative upper endoscopy to evaluate the gastric pouch.  Operative Note: The patient is under general anesthesia.  Dr. Hassell Done is laparoscoping the patient while I do an upper endoscopy to evaluate the stomach pouch.  With the patient intubated, I passed the Pentax upper endoscope without difficulty down the esophagus.  The esophago-gastric junction was at 38 cm.    The mucosa of the stomach looked viable and the staple line was intact without bleeding.  I advanced to the pylorus, but did not go through it.  While I insufflated the stomach pouch with air, Dr. Hassell Done  flooded the upper abdomen with saline to put the gastric pouch under saline.  There was no bubbling or evidence of a leak.   There was no evidence of narrowing of the pouch and the gastric sleeve looked tubular.  There was serosal tear/burn on the stomach at the distal end of the staple line.  This was not full thickness by endoscopy.  Dr. Hassell Done placed a vicryl suture in this area.  The scope was then withdrawn.  The esophagus was unremarkable and the patient tolerated the endoscopy without difficulty.  Alphonsa Overall, MD, [facility] Pager: [phone] Office phone:  [phone]

## 2014-04-05 NOTE — Anesthesia Procedure Notes (Signed)
Procedure Name: Intubation Date/Time: 04/05/2014 10:21 AM Performed by: Maxwell Caul Pre-anesthesia Checklist: Patient identified, Emergency Drugs available, Suction available and Patient being monitored Patient Re-evaluated:Patient Re-evaluated prior to inductionOxygen Delivery Method: Circle System Utilized Preoxygenation: Pre-oxygenation with 100% oxygen Intubation Type: IV induction Ventilation: Mask ventilation without difficulty Laryngoscope Size: Mac and 4 Grade View: Grade I Tube type: Oral Tube size: 7.5 mm Number of attempts: 1 Airway Equipment and Method: Stylet and Oral airway Placement Confirmation: ETT inserted through vocal cords under direct vision,  positive ETCO2 and breath sounds checked- equal and bilateral Secured at: 21 cm Tube secured with: Tape Dental Injury: Teeth and Oropharynx as per pre-operative assessment

## 2014-04-05 NOTE — Op Note (Signed)
Surgeon: Kaylyn Lim, MD, FACS  Asst:  Alphonsa Overall, MD, FACS  Anes:  General endotracheal  Procedure: Laparoscopic sleeve gastrectomy and upper endoscopy; 3 suture repair of hiatus hernia posteriorally  Diagnosis: Morbid obesity  Complications: none  EBL:   15 cc  Description of Procedure:  The patient was take to OR 1 and given general anesthesia.  The abdomen was prepped with PCMX and draped sterilely.  A timeout was performed.  Access to the abdomen was achieved with 5 mm Optiview through the left upper quadrant.  Following insufflation, the state of the abdomen was found to be free of adhesions.  A very prominent visible dimple was observed and the sliding hiatus hernia was confirmed with a positive balloon test.  The crura were dissected out posteriorally and approximated with 3 sutures of Surgidek and tyknots.  The ViSiGi 36Fr tube was inserted to deflate the stomach and was pulled back into the esophagus.    The pylorus was identified and we measured 5 cm back and marked the antrum.  At that point we began dissection to take down the greater curvature of the stomach using the Harmonic scalpel.  This dissection was taken all the way up to the left crus.  Posterior attachments of the stomach were also taken down.    The ViSiGi tube was then passed into the antrum and suction applied so that it was snug along the lessor curvature.  The "crow's foot" or incisura was identified.  The sleeve gastrectomy was begun using the [employer] stapler beginning with a 4.5 mm black load with TRS and then 6 cm black loads and purple loads all with TRS.  When the sleeve was complete the tube was taken off suction and insufflated briefly.  The tube was withdrawn.  Upper endoscopy was then performed by Dr. Lucia Gaskins and no bleeding or leaks were noted.  A serorsal tear at the first staple line was oversewn with a figure of 8 of 2-0 vicryl.     The specimen was extracted through the 15 trocar site.   Wounds were infiltrated with Exparel  and closed with 4.-0 vicryl.  The specimen extraction site seemed contaminated by the open gastric sleeve specimen and this was closed with Endoclose and  0 vicryl and then this was packed with 1/4 in iodophor gauze.    [name], [employer], [facility], [place]

## 2014-04-05 NOTE — Interval H&P Note (Signed)
History and Physical Interval Note:  04/05/2014 9:51 AM  Suzanne Schaefer  has presented today for surgery, with the diagnosis of MORBID OBESITY  The various methods of treatment have been discussed with the patient and family. After consideration of risks, benefits and other options for treatment, the patient has consented to  Procedure(s): LAPAROSCOPIC GASTRIC SLEEVE RESECTION (N/A) as a surgical intervention .  The patient's history has been reviewed, patient examined, no change in status, stable for surgery.  I have reviewed the patient's chart and labs.  Questions were answered to the patient's satisfaction.     Marketta Valadez B

## 2014-04-06 ENCOUNTER — Inpatient Hospital Stay (HOSPITAL_COMMUNITY): Payer: 59

## 2014-04-06 ENCOUNTER — Encounter (HOSPITAL_COMMUNITY): Payer: Self-pay | Admitting: Surgery

## 2014-04-06 LAB — CBC WITH DIFFERENTIAL/PLATELET
BASOS ABS: 0 10*3/uL (ref 0.0–0.1)
Basophils Relative: 0 % (ref 0–1)
EOS ABS: 0 10*3/uL (ref 0.0–0.7)
EOS PCT: 0 % (ref 0–5)
HCT: 34.8 % — ABNORMAL LOW (ref 36.0–46.0)
Hemoglobin: 11.2 g/dL — ABNORMAL LOW (ref 12.0–15.0)
Lymphocytes Relative: 8 % — ABNORMAL LOW (ref 12–46)
Lymphs Abs: 1.1 10*3/uL (ref 0.7–4.0)
MCH: 27.4 pg (ref 26.0–34.0)
MCHC: 32.2 g/dL (ref 30.0–36.0)
MCV: 85.1 fL (ref 78.0–100.0)
MONO ABS: 0.9 10*3/uL (ref 0.1–1.0)
MONOS PCT: 7 % (ref 3–12)
NEUTROS ABS: 10.8 10*3/uL — AB (ref 1.7–7.7)
Neutrophils Relative %: 85 % — ABNORMAL HIGH (ref 43–77)
Platelets: 214 10*3/uL (ref 150–400)
RBC: 4.09 MIL/uL (ref 3.87–5.11)
RDW: 14.8 % (ref 11.5–15.5)
WBC: 12.8 10*3/uL — ABNORMAL HIGH (ref 4.0–10.5)

## 2014-04-06 LAB — HEMOGLOBIN AND HEMATOCRIT, BLOOD
HCT: 34.6 % — ABNORMAL LOW (ref 36.0–46.0)
Hemoglobin: 11.1 g/dL — ABNORMAL LOW (ref 12.0–15.0)

## 2014-04-06 MED ORDER — IOHEXOL 300 MG/ML  SOLN
50.0000 mL | Freq: Once | INTRAMUSCULAR | Status: AC | PRN
  Administered 2014-04-06: 50 mL via ORAL

## 2014-04-06 NOTE — Care Management Note (Signed)
    Page 1 of 1   04/06/2014     10:06:06 AM CARE MANAGEMENT NOTE 04/06/2014  Patient:  Suzanne Schaefer, Suzanne Schaefer   Account Number:  [id number]  Date Initiated:  04/06/2014  Documentation initiated by:  Sunday Spillers  Subjective/Objective Assessment:   54 yo female admitted s/p sleeve gastrectomy. PTA lived at home alone.     Action/Plan:   Home when stable   Anticipated DC Date:  04/06/2014   Anticipated DC Plan:  [place]  CM consult      Choice offered to / List presented to:             Status of service:  Completed, signed off Medicare Important Message given?   (If response is "NO", the following Medicare IM given date fields will be blank) Date Medicare IM given:   Medicare IM given by:   Date Additional Medicare IM given:   Additional Medicare IM given by:    Discharge Disposition:  HOME/SELF CARE  Per UR Regulation:  Reviewed for med. necessity/level of care/duration of stay  If discussed at [place] of Stay Meetings, dates discussed:    Comments:

## 2014-04-06 NOTE — Progress Notes (Signed)
General Surgery Note  LOS: 1 day  POD -  1 Day Post-Op  Assessment/Plan: 1.  LAPAROSCOPIC GASTRIC SLEEVE RESECTION with hiatel hernia repair, UPPER GI ENDOSCOPY - 04/05/2014 - M. Hassell Done  For morbid obesity  For swallow today  2.  Anxiety disorder 3.  Sleep apnea 4.  DVT prophylaxis - SQ Heparin   Active Problems:   S/P laparoscopic sleeve gastrectomy  Subjective:  Doing well.  She had a question about a "deteriorating stomach" - but I think she misunderstood what was said.  No nausea. Objective:   Filed Vitals:   04/06/14 0525  BP: 119/51  Pulse: 84  Temp: 98.8 F (37.1 C)  Resp: 16     Intake/Output from previous day:  03/01 0701 - 03/02 0700 In: 3283.3 [I.V.:3283.3] Out: 1300 [Urine:1300]  Intake/Output this shift:      Physical Exam:   General: WN obese WF who is alert and oriented.    HEENT: Normal. Pupils equal. .   Lungs: Clear   Abdomen: Soft   Wound: Clean.  Dressing in right abdomen.      Lab Results:    Recent Labs  04/05/14 1400 04/05/14 1401 04/06/14 0500  WBC 15.4*  --  12.8*  HGB 12.1 12.1 11.2*  HCT 37.3 37.0 34.8*  PLT 235  --  214    BMET   Recent Labs  04/05/14 1400  CREATININE 0.71    PT/INR  No results for input(s): LABPROT, INR in the last 72 hours.  ABG  No results for input(s): PHART, HCO3 in the last 72 hours.  Invalid input(s): PCO2, PO2   Studies/Results:  No results found.   Anti-infectives:   Anti-infectives    Start     Dose/Rate Route Frequency Ordered Stop   04/05/14 0819  cefOXitin (MEFOXIN) 2 g in dextrose 5 % 50 mL IVPB     2 g 100 mL/hr over 30 Minutes Intravenous On call to O.R. 04/05/14 0819 04/05/14 1030      Alphonsa Overall, MD, FACS Pager: [facility] Office: [phone] 04/06/2014

## 2014-04-06 NOTE — Plan of Care (Signed)
Problem: Food- and Nutrition-Related Knowledge Deficit (NB-1.1) Goal: Nutrition education Formal process to instruct or train a patient/client in a skill or to impart knowledge to help patients/clients voluntarily manage or modify food choices and eating behavior to maintain or improve health. Outcome: Completed/Met Date Met:  04/06/14 Nutrition Education Note  Received consult for diet education per DROP protocol.   Discussed 2 week post op diet with pt. Emphasized that liquids must be non carbonated, non caffeinated, and sugar free. Fluid goals discussed. Pt to follow up with outpatient bariatric RD for further diet progression after 2 weeks. Multivitamins and minerals also reviewed. Teach back method used, pt expressed understanding, expect good compliance.   Diet: First 2 Weeks  You will see the nutritionist about two (2) weeks after your surgery. The nutritionist will increase the types of foods you can eat if you are handling liquids well:  If you have severe vomiting or nausea and cannot handle clear liquids lasting longer than 1 day, call your surgeon  Protein Shake  Drink at least 2 ounces of shake 5-6 times per day  Each serving of protein shakes (usually 8 - 12 ounces) should have a minimum of:  15 grams of protein  And no more than 5 grams of carbohydrate  Goal for protein each day:  Men = 80 grams per day  Women = 60 grams per day  Protein powder may be added to fluids such as non-fat milk or Lactaid milk or Soy milk (limit to 35 grams added protein powder per serving)   Hydration  Slowly increase the amount of water and other clear liquids as tolerated (See Acceptable Fluids)  Slowly increase the amount of protein shake as tolerated  Sip fluids slowly and throughout the day  May use sugar substitutes in small amounts (no more than 6 - 8 packets per day; i.e. Splenda)   Fluid Goal  The first goal is to drink at least 8 ounces of protein shake/drink per day (or as directed  by the nutritionist); some examples of protein shakes are [employer], [employer], EAS Edge HP, and [place]jury. See handout from pre-op Bariatric Education Class:  Slowly increase the amount of protein shake you drink as tolerated  You may find it easier to slowly sip shakes throughout the day  It is important to get your proteins in first  Your fluid goal is to drink 64 - 100 ounces of fluid daily  It may take a few weeks to build up to this  32 oz (or more) should be clear liquids  And  32 oz (or more) should be full liquids (see below for examples)  Liquids should not contain sugar, caffeine, or carbonation   Clear Liquids:  Water or Sugar-free flavored water (i.e. Fruit H2O, Propel)  Decaffeinated coffee or tea (sugar-free)  Crystal Lite, Wyler's Lite, Minute Maid Lite  Sugar-free Jell-O  Bouillon or broth  Sugar-free Popsicle: *Less than 20 calories each; Limit 1 per day   Full Liquids:  Protein Shakes/Drinks + 2 choices per day of other full liquids  Full liquids must be:  No More Than 12 grams of Carbs per serving  No More Than 3 grams of Fat per serving  Strained low-fat cream soup  Non-Fat milk  Fat-free Lactaid Milk  Sugar-free yogurt (Dannon Lite & Fit, [place] yogurt)     [place], [place], [place]

## 2014-04-06 NOTE — Progress Notes (Signed)
Patient alert and oriented, Post op day 1.  Provided support and encouragement.  Encouraged pulmonary toilet, ambulation and small sips of liquids.  All questions answered.  Will continue to monitor. 

## 2014-04-07 LAB — CBC WITH DIFFERENTIAL/PLATELET
Basophils Absolute: 0 10*3/uL (ref 0.0–0.1)
Basophils Relative: 0 % (ref 0–1)
EOS ABS: 0 10*3/uL (ref 0.0–0.7)
EOS PCT: 0 % (ref 0–5)
HCT: 34.4 % — ABNORMAL LOW (ref 36.0–46.0)
Hemoglobin: 10.7 g/dL — ABNORMAL LOW (ref 12.0–15.0)
Lymphocytes Relative: 14 % (ref 12–46)
Lymphs Abs: 1.3 10*3/uL (ref 0.7–4.0)
MCH: 26.4 pg (ref 26.0–34.0)
MCHC: 31.1 g/dL (ref 30.0–36.0)
MCV: 84.9 fL (ref 78.0–100.0)
Monocytes Absolute: 0.7 10*3/uL (ref 0.1–1.0)
Monocytes Relative: 8 % (ref 3–12)
NEUTROS PCT: 78 % — AB (ref 43–77)
Neutro Abs: 7.6 10*3/uL (ref 1.7–7.7)
PLATELETS: 169 10*3/uL (ref 150–400)
RBC: 4.05 MIL/uL (ref 3.87–5.11)
RDW: 15.1 % (ref 11.5–15.5)
WBC: 9.7 10*3/uL (ref 4.0–10.5)

## 2014-04-07 NOTE — Progress Notes (Signed)
Patient alert and oriented, pain is controlled. Patient is tolerating fluids, plan to advance to protein shake today.  Reviewed Gastric sleeve discharge instructions with patient and patient is able to articulate understanding.  Provided information on BELT program, Support Group and [facility] outpatient pharmacy. All questions answered, will continue to monitor.  

## 2014-04-07 NOTE — Discharge Instructions (Signed)
[facility] [place] [facility] - DISCHARGE INSTRUCTIONS TO PATIENT  Return to work on:  Will return after seeing Dr. Hassell Done  Activity:  Driving - May drive in 4 or 5 days, if doing well   Lifting - Take it easy for 2 weeks, then no limits  Wound Care:   May shower  Diet:  Bariatric diet - protein drinks  Follow up appointment:  You have an appt with Dr. Hassell Done.  Call Dr. Earlie Server office [facility]) at [phone] for any questions.  Medications and dosages:  Resume your home medications.  You have a prescription for:  Roxicet elixir  Call Dr. Hassell Done or his office  [phone]) if you have:  Temperature greater than 100.4,  Persistent nausea and vomiting,  Severe uncontrolled pain,  Redness, tenderness, or signs of infection (pain, swelling, redness, odor or green/yellow discharge around the site),  Difficulty breathing, headache or visual disturbances,  Any other questions or concerns you may have after discharge.  In an emergency, call 911 or go to an Emergency Department at a nearby hospital.       GASTRIC BYPASS/SLEEVE  Home Care Instructions   These instructions are to help you care for yourself when you go home.  Call: If you have any problems. Call [phone] and ask for the surgeon on call If you need immediate assistance come to the ER at [facility]. Tell the ER staff you are a new post-op gastric bypass or gastric sleeve patient  Signs and symptoms to report: Severe  vomiting or nausea If you cannot handle clear liquids for longer than 1 day, call your surgeon Abdominal pain which does not get better after taking your pain medication Fever greater than 100.4  F and chills Heart rate over 100 beats a minute Trouble breathing Chest pain Redness,  swelling, drainage, or foul odor at incision (surgical) sites If your incisions open or pull apart Swelling or pain in calf (lower leg) Diarrhea (Loose bowel movements that happen often), frequent  watery, uncontrolled bowel movements Constipation, (no bowel movements for 3 days) if this happens: Take Milk of Magnesia, 2 tablespoons by mouth, 3 times a day for 2 days if needed Stop taking Milk of Magnesia once you have had a bowel movement Call your doctor if constipation continues Or Take Miralax  (instead of Milk of Magnesia) following the label instructions Stop taking Miralax once you have had a bowel movement Call your doctor if constipation continues Anything you think is abnormal for you   Normal side effects after surgery: Unable to sleep at night or unable to concentrate Irritability Being tearful (crying) or depressed  These are common complaints, possibly related to your anesthesia, stress of surgery, and change in lifestyle, that usually go away a few weeks after surgery. If these feelings continue, call your medical doctor.  Wound Care: You may have surgical glue, steri-strips, or staples over your incisions after surgery Surgical glue: Looks like clear film over your incisions and will wear off a little at a time Steri-strips: Adhesive strips of tape over your incisions. You may notice a yellowish color on skin under the steri-strips. This is used to make the steri-strips stick better. Do not pull the steri-strips off - let them fall off Staples: Staples may be removed before you leave the hospital If you go home with staples, call [facility] for an appointment with your surgeons nurse to have staples removed 10 days after surgery, [phone] Showering: You may shower two (2)  days after your surgery unless your surgeon tells you differently Wash gently around incisions with warm soapy water, rinse well, and gently pat dry If you have a drain (tube from your incision), you may need someone to hold this while you shower No tub baths until staples are removed and incisions are healed   Medications: Medications should be liquid or crushed if larger than  the size of a dime Extended release pills (medication that releases a little bit at a time through the  day) should not be crushed Depending on the size and number of medications you take, you may need to space (take a few throughout the day)/change the time you take your medications so that you do not over-fill your pouch (smaller stomach) Make sure you follow-up with you primary care physician to make medication changes needed during rapid weight loss and life -style changes If you have diabetes, follow up with your doctor that orders your diabetes medication(s) within one week after surgery and check your blood sugar regularly  Do not drive while taking narcotics (pain medications)  Do not take acetaminophen (Tylenol) and Roxicet or Lortab Elixir at the same time since these pain medications contain acetaminophen   Diet:  First 2 Weeks You will see the nutritionist about two (2) weeks after your surgery. The nutritionist will increase the types of foods you can eat if you are handling liquids well: If you have severe vomiting or nausea and cannot handle clear liquids lasting longer than 1 day call your surgeon Protein Shake Drink at least 2 ounces of shake 5-6 times per day Each serving of protein shakes (usually 8-12 ounces) should have a minimum of: 15 grams of protein And no more than 5 grams of carbohydrate Goal for protein each day: Men = 80 grams per day Women = 60 grams per day    Protein powder may be added to fluids such as non-fat milk or Lactaid milk or Soy milk (limit to 35 grams added protein powder per serving)  Hydration Slowly increase the amount of water and other clear liquids as tolerated (See Acceptable Fluids) Slowly increase the amount of protein shake as tolerated Sip fluids slowly and throughout the day May use sugar substitutes in small amounts (no more than 6-8 packets per day; i.e. Splenda)  Fluid Goal The first goal is to drink at least 8 ounces of  protein shake/drink per day (or as directed by the nutritionist); some examples of protein shakes are [employer], [employer], EAS Edge HP, and Unjury. - See handout from pre-op Bariatric Education Class: Slowly increase the amount of protein shake you drink as tolerated You may find it easier to slowly sip shakes throughout the day It is important to get your proteins in first Your fluid goal is to drink 64-100 ounces of fluid daily It may take a few weeks to build up to this  32 oz. (or more) should be clear liquids And 32 oz. (or more) should be full liquids (see below for examples) Liquids should not contain sugar, caffeine, or carbonation  Clear Liquids: Water of Sugar-free flavored water (i.e. Fruit HO, Propel) Decaffeinated coffee or tea (sugar-free) Crystal lite, Wylers Lite, Minute Maid Lite Sugar-free Jell-O Bouillon or broth Sugar-free Popsicle:    - Less than 20 calories each; Limit 1 per day  Full Liquids:                   Protein Shakes/Drinks + 2 choices per day of  other full liquids Full liquids must be: No More Than 12 grams of Carbs per serving No More Than 3 grams of Fat per serving Strained low-fat cream soup Non-Fat milk Fat-free Lactaid Milk Sugar-free yogurt (Dannon Lite & Fit, Greek yogurt)    Vitamins and Minerals Start 1 day after surgery unless otherwise directed by your surgeon 2 Chewable Multivitamin / Multimineral Supplement with iron (i.e. Centrum for Adults) Vitamin B-12, 350-500 micrograms sub-lingual (place tablet under the tongue) each day Chewable Calcium Citrate with Vitamin D-3 (Example: 3 Chewable Calcium  Plus 600 with Vitamin D-3) Take 500 mg three (3) times a day for a total of 1500 mg each day Do not take all 3 doses of calcium at one time as it may cause constipation, and you can only absorb 500 mg at a time Do not mix multivitamins containing iron with calcium supplements;  take 2 hours apart Do not substitute Tums  (calcium carbonate) for your calcium Menstruating women and those at risk for anemia ( a blood disease that causes weakness) may need extra iron Talk to your doctor to see if you need more iron If you need extra iron: Total daily Iron recommendation (including Vitamins) is 50 to 100 mg Iron/day Do not stop taking or change any vitamins or minerals until you talk to your nutritionist or surgeon Your nutritionist and/or surgeon must approve all vitamin and mineral supplements   Activity and Exercise: It is important to continue walking at home. Limit your physical activity as instructed by your doctor. During this time, use these guidelines: Do not lift anything greater than ten  (10) pounds for at least two (2) weeks Do not go back to work or drive until Engineer, production says you can You may have sex when you feel comfortable It is VERY important for female patients to use a reliable birth control method; fertility often increase after surgery Do not get pregnant for at least 18 months Start exercising as soon as your doctor tells you that you can Make sure your doctor approves any physical activity Start with a simple walking program Walk 5-15 minutes each day, 7 days per week Slowly increase until you are walking 30-45 minutes per day Consider joining our [place] program. [phone] or email [email or web address]   Special Instructions Things to remember: Free counseling is available for you and your family through collaboration between [facility] and [place]. Please call [phone] and leave a message Use your CPAP when sleeping if this applies to you Consider buying a medical alert bracelet that says you had lap-band surgery     You will likely have your first fill (fluid added to your band) 6 - 8 weeks after surgery [facility] has a free Bariatric Surgery Support Group that meets monthly, the 3rd Thursday, [place]. You can see classes online at  [email or web address] It is very important to keep all follow up appointments with your surgeon, nutritionist, primary care physician, and behavioral health practitioner After the first year, please follow up with your bariatric surgeon and nutritionist at least once a year in order to maintain best weight loss results                    [place] [facility]:  [place]: [phone]  Bariatric Nurse Coordinator: [phone]  Gastric Bypass/Sleeve Home Care Instructions  Rev. 03/2012                                                         Reviewed and Endorsed                                                    by [facility] Patient Education Committee, Jan, 2014

## 2014-04-07 NOTE — Discharge Summary (Signed)
Physician Discharge Summary  Patient ID:  Suzanne Schaefer  MRN: [id number]  DOB/AGE: 1960/09/08 54 y.o.  Admit date: 04/05/2014 Discharge date: 04/07/2014  Discharge Diagnoses:  1.  Morbid Obesity (Weight - 272, BMI - 48.2) , S/P Gastric Sleeve 2. Anxiety disorder 3. Sleep apnea   Active Problems:   S/P laparoscopic sleeve gastrectomy   Operation: Procedure(s): LAPAROSCOPIC GASTRIC SLEEVE RESECTION with hiatel hernia repair, UPPER GI ENDOSCOPY on 04/05/2014 - M. [facility]  Discharged Condition: good  Hospital Course: Suzanne Schaefer is an 54 y.o. female whose primary care physician is Weiskopf Kehr, MD and who was admitted 04/05/2014 with a chief complaint of morbid obesity.   She was brought to the operating room on 04/05/2014 and underwent  LAPAROSCOPIC GASTRIC SLEEVE RESECTION with hiatel hernia repair, UPPER GI ENDOSCOPY.  Her swallow on 3/2 looked good. She complained of a "fever" the night of 3/2.  Her temp reached 99.8 - but I would not count that as a fever.  Her WBC is 9,700 today.  She will take her protein drinks.  And she is ready to go home.  The discharge instructions were reviewed with the patient.  Consults: None  Significant Diagnostic Studies: Results for orders placed or performed during the hospital encounter of 04/05/14  Pregnancy, urine STAT morning of surgery  Result Value Ref Range   Preg Test, Ur NEGATIVE NEGATIVE  Hemoglobin and hematocrit, blood  Result Value Ref Range   Hemoglobin 12.1 12.0 - 15.0 g/dL   HCT 37.0 36.0 - 46.0 %  CBC  Result Value Ref Range   WBC 15.4 (H) 4.0 - 10.5 K/uL   RBC 4.40 3.87 - 5.11 MIL/uL   Hemoglobin 12.1 12.0 - 15.0 g/dL   HCT 37.3 36.0 - 46.0 %   MCV 84.8 78.0 - 100.0 fL   MCH 27.5 26.0 - 34.0 pg   MCHC 32.4 30.0 - 36.0 g/dL   RDW 14.5 11.5 - 15.5 %   Platelets 235 150 - 400 K/uL  Creatinine, serum  Result Value Ref Range   Creatinine, Ser 0.71 0.50 - 1.10 mg/dL   GFR calc non Af Amer >90 >90 mL/min   GFR calc Af  Amer >90 >90 mL/min  CBC WITH DIFFERENTIAL  Result Value Ref Range   WBC 12.8 (H) 4.0 - 10.5 K/uL   RBC 4.09 3.87 - 5.11 MIL/uL   Hemoglobin 11.2 (L) 12.0 - 15.0 g/dL   HCT 34.8 (L) 36.0 - 46.0 %   MCV 85.1 78.0 - 100.0 fL   MCH 27.4 26.0 - 34.0 pg   MCHC 32.2 30.0 - 36.0 g/dL   RDW 14.8 11.5 - 15.5 %   Platelets 214 150 - 400 K/uL   Neutrophils Relative % 85 (H) 43 - 77 %   Neutro Abs 10.8 (H) 1.7 - 7.7 K/uL   Lymphocytes Relative 8 (L) 12 - 46 %   Lymphs Abs 1.1 0.7 - 4.0 K/uL   Monocytes Relative 7 3 - 12 %   Monocytes Absolute 0.9 0.1 - 1.0 K/uL   Eosinophils Relative 0 0 - 5 %   Eosinophils Absolute 0.0 0.0 - 0.7 K/uL   Basophils Relative 0 0 - 1 %   Basophils Absolute 0.0 0.0 - 0.1 K/uL  Hemoglobin and hematocrit, blood  Result Value Ref Range   Hemoglobin 11.1 (L) 12.0 - 15.0 g/dL   HCT 34.6 (L) 36.0 - 46.0 %  CBC with Differential  Result Value Ref Range   WBC 9.7  4.0 - 10.5 K/uL   RBC 4.05 3.87 - 5.11 MIL/uL   Hemoglobin 10.7 (L) 12.0 - 15.0 g/dL   HCT 34.4 (L) 36.0 - 46.0 %   MCV 84.9 78.0 - 100.0 fL   MCH 26.4 26.0 - 34.0 pg   MCHC 31.1 30.0 - 36.0 g/dL   RDW 15.1 11.5 - 15.5 %   Platelets 169 150 - 400 K/uL   Neutrophils Relative % 78 (H) 43 - 77 %   Neutro Abs 7.6 1.7 - 7.7 K/uL   Lymphocytes Relative 14 12 - 46 %   Lymphs Abs 1.3 0.7 - 4.0 K/uL   Monocytes Relative 8 3 - 12 %   Monocytes Absolute 0.7 0.1 - 1.0 K/uL   Eosinophils Relative 0 0 - 5 %   Eosinophils Absolute 0.0 0.0 - 0.7 K/uL   Basophils Relative 0 0 - 1 %   Basophils Absolute 0.0 0.0 - 0.1 K/uL    Dg Ugi W/water Sol Cm  04/06/2014   CLINICAL DATA:  Postop gastric sleeve yesterday.  EXAM: WATER SOLUBLE UPPER GI SERIES  TECHNIQUE: Single-column upper GI series was performed using water soluble contrast.  CONTRAST:  10mL OMNIPAQUE IOHEXOL 300 MG/ML  SOLN  COMPARISON:  None.  FLUOROSCOPY TIME:  Radiation Exposure Index (as provided by the fluoroscopic device):  If the device does not provide  the exposure index:  Fluoroscopy Time (in minutes and seconds):  0 minutes 25 seconds  Number of Acquired Images:  2  FINDINGS: Scout view of the abdomen shows postoperative changes of sleeve gastrectomy in the left upper quadrant. Cholecystectomy clips. Mild gaseous distention of bowel in the lower abdomen.  Patient drank 50 cc water soluble contrast. Contrast flowed readily into and through the stomach with postoperative changes of sleeve gastrectomy noted.  IMPRESSION: Postoperative changes of sleeve gastrectomy without complicating feature.   Electronically Signed   By: Lorin Picket M.D.   On: 04/06/2014 11:11    Discharge Exam:  Filed Vitals:   04/07/14 0453  BP: 125/66  Pulse: 74  Temp: 98.3 F (36.8 C)  Resp: 18    General: Obese WF who is alert. Lungs: Clear to auscultation and symmetric breath sounds. Heart:  RRR. No murmur or rub. Abdomen: Soft. No mass. No hernia. Normal bowel sounds.  I removed the wick from the right sided abdominal wound.     Discharge Medications:     Medication List    STOP taking these medications        BAYER BACK & BODY PAIN EX ST PO      TAKE these medications        CITRACAL CALCIUM+D PO  Take 2 capsules by mouth every morning.     diphenhydramine-acetaminophen 25-500 MG Tabs  Commonly known as:  TYLENOL PM  Take 1 tablet by mouth at bedtime as needed (aches and sleep.).     eszopiclone 2 MG Tabs tablet  Commonly known as:  LUNESTA  Take 1 tablet (2 mg total) by mouth at bedtime as needed for sleep. Take immediately before bedtime     Flaxseed Oil 1000 MG Caps  Take 3 capsules by mouth every morning.     fluticasone 50 MCG/ACT nasal spray  Commonly known as:  FLONASE  Place 2 sprays into both nostrils at bedtime.     Glucosamine HCl 1000 MG Tabs  Take 2 tablets by mouth every morning.     ibuprofen 200 MG tablet  Commonly known as:  ADVIL,MOTRIN  Take 600 mg by mouth every 6 (six) hours as needed for headache or moderate  pain.     MIRALAX PO  Take 1 scoop by mouth at bedtime.     MOVE FREE PO  Take 1 capsule by mouth daily.     MOVE FREE PO  Take 2 tablets by mouth at bedtime.     pantoprazole 40 MG tablet  Commonly known as:  PROTONIX  Take 1 tablet (40 mg total) by mouth daily.     rOPINIRole 0.25 MG tablet  Commonly known as:  REQUIP  Take 3 tablets (0.75 mg total) by mouth at bedtime.     Vitamin D-3 1000 UNITS Caps  Take 2 capsules by mouth every morning.     ZYRTEC-D PO  Take 1 tablet by mouth every morning.        Disposition: 01-Home or Self Care      Discharge Instructions    Ambulate hourly while awake    Complete by:  As directed      Call MD for:  difficulty breathing, headache or visual disturbances    Complete by:  As directed      Call MD for:  persistant dizziness or light-headedness    Complete by:  As directed      Call MD for:  persistant nausea and vomiting    Complete by:  As directed      Call MD for:  redness, tenderness, or signs of infection (pain, swelling, redness, odor or green/yellow discharge around incision site)    Complete by:  As directed      Call MD for:  severe uncontrolled pain    Complete by:  As directed      Call MD for:  temperature >101 F    Complete by:  As directed      Diet bariatric full liquid    Complete by:  As directed      Incentive spirometry    Complete by:  As directed   Perform hourly while awake            Return to work on:  Will return after seeing Dr. Hassell Done  Activity:  Driving - May drive in 4 or 5 days, if doing well   Lifting - Take it easy for 2 weeks, then no limits  Wound Care:   May shower  Diet:  Bariatric diet - protein drinks  Follow up appointment:  You have an appt with Dr. Hassell Done.  Call Dr. Earlie Server office [facility]) at [phone] for any questions.  Medications and dosages:  Resume your home medications.  You have a prescription for:  Roxicet elixir   Signed: Alphonsa Overall,  M.D., [facility] Office:  [phone]  04/07/2014, 7:38 AM

## 2014-04-08 ENCOUNTER — Telehealth (HOSPITAL_COMMUNITY): Payer: Self-pay

## 2014-04-08 NOTE — Telephone Encounter (Signed)
Made discharge phone call to patient per DROP protocol. Asking the following questions.    1. Do you have someone to care for you now that you are home?  yes 2. Are you having pain now that is not relieved by your pain medication?  no 3. Are you able to drink the recommended daily amount of fluids (48 ounces minimum/day) and protein (60-80 grams/day) as prescribed by the dietitian or nutritional counselor?  Protein - drinking protein every 2 hours, drinking water inbetween 4. Are you taking the vitamins and minerals as prescribed?  yes 5. Do you have the "on call" number to contact your surgeon if you have a problem or question?  yes 6. Are your incisions free of redness, swelling or drainage? (If steri strips, address that these can fall off, shower as tolerated) yes 7. Have your bowels moved since your surgery?  If not, are you passing gas?  No, yes 8. Are you up and walking 3-4 times per day?  yes    1. Do you have an appointment made to see your surgeon in the next month?  yes 2. Were you provided your discharge medications before your surgery or before you were discharged from the hospital and are you taking them without problem?  yes 3. Were you provided phone numbers to the clinic/surgeon's office?  yes 4. Did you watch the patient education video module in the (clinic, surgeon's office, etc.) before your surgery? yes 5. Do you have a discharge checklist that was provided to you in the hospital to reference with instructions on how to take care of yourself after surgery?  yes 6. Did you see a dietitian or nutritional counselor while you were in the hospital?  yes 7. Do you have an appointment to see a dietitian or nutritional counselor in the next month?  yes

## 2014-04-19 ENCOUNTER — Encounter: Payer: 59 | Attending: Surgery

## 2014-04-19 DIAGNOSIS — Z713 Dietary counseling and surveillance: Secondary | ICD-10-CM | POA: Insufficient documentation

## 2014-04-19 DIAGNOSIS — E669 Obesity, unspecified: Secondary | ICD-10-CM | POA: Diagnosis present

## 2014-04-19 DIAGNOSIS — Z6841 Body Mass Index (BMI) 40.0 and over, adult: Secondary | ICD-10-CM | POA: Diagnosis not present

## 2014-04-19 NOTE — Progress Notes (Signed)
Bariatric Class:  Appt start time: 1530 end time:  1630.  2 Week Post-Operative Nutrition Class  Patient was seen on 04/19/2014 for Post[facility]ative [facility]   Surgery date: 04/05/2014 Surgery type: Gastric sleeve Start weight at [facility]: 269 lbs on 01/08/2014 Weight today: 242.5 lbs  Weight change: 27 lbs  TANITA  BODY COMP RESULTS  03/14/14 04/19/14   BMI (kg/m^2) 47.7 43.0   Fat Mass (lbs) 134.5 123.5   Fat Free Mass (lbs) 135 119.0   Total Body Water (lbs) 99 87.0    The following the learning objectives were met by the patient during this course:  Identifies Phase 3A (Soft, High Proteins) Dietary Goals and will begin from 2 weeks post-operatively to 2 months post-operatively  Identifies appropriate sources of fluids and proteins   States protein recommendations and appropriate sources post-operatively  Identifies the need for appropriate texture modifications, mastication, and bite sizes when consuming solids  Identifies appropriate multivitamin and calcium sources post-operatively  Describes the need for physical activity post-operatively and will follow MD recommendations  States when to call healthcare provider regarding medication questions or post-operative complications  Handouts given during class include:  Phase 3A: Soft, High Protein Diet Handout  Follow-Up Plan: Patient will follow-up at [facility] in 6 weeks for 2 month post-op nutrition visit for diet advancement per MD.

## 2014-04-25 ENCOUNTER — Other Ambulatory Visit (INDEPENDENT_AMBULATORY_CARE_PROVIDER_SITE_OTHER): Payer: Self-pay

## 2014-05-05 ENCOUNTER — Ambulatory Visit: Payer: 59 | Admitting: Internal Medicine

## 2014-05-31 ENCOUNTER — Encounter: Payer: Self-pay | Admitting: Dietician

## 2014-05-31 ENCOUNTER — Encounter: Payer: 59 | Attending: Surgery | Admitting: Dietician

## 2014-05-31 VITALS — Ht 63.0 in | Wt 230.0 lb

## 2014-05-31 DIAGNOSIS — Z713 Dietary counseling and surveillance: Secondary | ICD-10-CM | POA: Insufficient documentation

## 2014-05-31 DIAGNOSIS — Z6841 Body Mass Index (BMI) 40.0 and over, adult: Secondary | ICD-10-CM | POA: Insufficient documentation

## 2014-05-31 DIAGNOSIS — E669 Obesity, unspecified: Secondary | ICD-10-CM | POA: Diagnosis present

## 2014-05-31 NOTE — Progress Notes (Signed)
  Follow-up visit:  8 Weeks Post-Operative Sleeve Gastrectomy Surgery  Medical Nutrition Therapy:  Appt start time: 1829 end time:  9371.  Primary concerns today: Post-operative Bariatric Surgery Nutrition Management. Returns with a 12.5 lbs and 25 lbs fat mass since last visit. Tolerating everything fine.   Surgery date: 04/05/2014 Surgery type: Gastric sleeve Start weight at [facility]: 269 lbs on 01/08/2014 Weight today: 230.0 lbs  Weight change: 12.5 lbs, 25 lbs of fat mass Total weight loss: 39 lbs  TANITA  BODY COMP RESULTS  03/14/14 04/19/14 05/31/14   BMI (kg/m^2) 47.7 43.0 40.7   Fat Mass (lbs) 134.5 123.5 98.5   Fat Free Mass (lbs) 135 119.0 131.5   Total Body Water (lbs) 99 87.0 96.5    Preferred Learning Style:   No preference indicated   Learning Readiness:   Ready  24-hr recall: B (AM): Premier Protein (30 g) Snk (AM): scrambled egg (6 g) L (PM): 3 oz beef kabob or chicken with cheese (21 g) Snk (PM): Atkins Lift (20 g) D (PM): 3/4 wendy's small chili or 2 slices of ham with 1 slice cheese (14 g) Snk (PM): none   Fluid intake: 31 oz of protein shake, 32 oz water (63 oz) Estimated total protein intake: 91 g  Medications:  See list  Supplementation:  Taking, though trouble remember all calicum pills  Using straws: No Drinking while eating: No Hair loss: No Carbonated beverages: No N/V/D/C: Had some diarrhea after a Lean Shake, taking miralax daily to keep her regular Dumping syndrome: No  Recent physical activity:  Walking everyday at least 30 minutes  Progress Towards Goal(s):  In progress.  Handouts given during visit include:  Phase 3B High Protein and Non Starchy Vegetables    Nutritional Diagnosis:  [place]-3.3 Overweight/obesity related to past poor dietary habits and physical inactivity as evidenced by patient w/ recent sleeve gastrectomy surgery following dietary guidelines for continued weight loss.    Intervention:  Nutrition education/diet  advancement. Plan: Goals:  Follow Phase 3B: High Protein + Non-Starchy Vegetables  Eat 3-6 small meals/snacks, every 3-5 hrs  Increase lean protein foods to meet 60g goal  Increase fluid intake to 64oz +  Avoid drinking 15 minutes before, during and 30 minutes after eating  Aim for >30 min of physical activity daily  Have just one protein shake per day (Replace with water)   Teaching Method Utilized:  Visual Auditory Hands on  Barriers to learning/adherence to lifestyle change: none  Demonstrated degree of understanding via:  Teach Back   Monitoring/Evaluation:  Dietary intake, exercise, and body weight. Follow up in 1 months for 3 month post-op visit.

## 2014-05-31 NOTE — Patient Instructions (Addendum)
Goals:  Follow Phase 3B: High Protein + Non-Starchy Vegetables  Eat 3-6 small meals/snacks, every 3-5 hrs  Increase lean protein foods to meet 60g goal  Increase fluid intake to 64oz +  Avoid drinking 15 minutes before, during and 30 minutes after eating  Aim for >30 min of physical activity daily  Have just one protein shake per day (Replace with water)  Surgery date: 04/05/2014 Surgery type: Gastric sleeve Start weight at [facility]: 269 lbs on 01/08/2014 Weight today: 230.0 lbs  Weight change: 12.5 lbs, 25 lbs of fat mass Total weight loss: 39 lbs  TANITA  BODY COMP RESULTS  03/14/14 04/19/14 05/31/14   BMI (kg/m^2) 47.7 43.0 40.7   Fat Mass (lbs) 134.5 123.5 98.5   Fat Free Mass (lbs) 135 119.0 131.5   Total Body Water (lbs) 99 87.0 96.5

## 2014-06-27 ENCOUNTER — Encounter: Payer: 59 | Attending: Surgery | Admitting: Dietician

## 2014-06-27 ENCOUNTER — Encounter: Payer: Self-pay | Admitting: Dietician

## 2014-06-27 VITALS — Ht 63.0 in | Wt 219.0 lb

## 2014-06-27 DIAGNOSIS — Z713 Dietary counseling and surveillance: Secondary | ICD-10-CM | POA: Diagnosis not present

## 2014-06-27 DIAGNOSIS — Z6841 Body Mass Index (BMI) 40.0 and over, adult: Secondary | ICD-10-CM | POA: Diagnosis not present

## 2014-06-27 DIAGNOSIS — E669 Obesity, unspecified: Secondary | ICD-10-CM | POA: Diagnosis present

## 2014-06-27 NOTE — Progress Notes (Signed)
  Follow-up visit:  12 Weeks Post-Operative Sleeve Gastrectomy Surgery  Medical Nutrition Therapy:  Appt start time: 830 end time:  900  Primary concerns today: Post-operative Bariatric Surgery Nutrition Management. Returns with a 11 lbs weight loss. Has tried a few bites of potatoes and cakes recently with no reactions.    Surgery date: 04/05/2014 Surgery type: Gastric sleeve Start weight at [facility]: 269 lbs on 01/08/2014 Weight today: 219 lbs  Weight change: 11 lbs  Total weight loss: 50 lbs  TANITA  BODY COMP RESULTS  03/14/14 04/19/14 05/31/14 06/27/14   BMI (kg/m^2) 47.7 43.0 40.7 38.8   Fat Mass (lbs) 134.5 123.5 98.5 99.5   Fat Free Mass (lbs) 135 119.0 131.5 119.5   Total Body Water (lbs) 99 87.0 96.5 87.5    Preferred Learning Style:   No preference indicated   Learning Readiness:   Ready  24-hr recall: B (AM): Premier Protein (30 g) Snk (AM): scrambled egg (6 g) L (PM): 3 oz beef kabob or chicken with cheese (21 g) Snk (PM): none D (PM): 3 oz of chicken or 3/4 wendy's small chili or 2 slices of ham with 1 slice cheese (70-35 g) Snk (PM): none   Fluid intake: 11 oz of protein shake, 36 oz water (47 oz) Estimated total protein intake: 71-78 g  Medications:  See list  Supplementation:  Taking, though trouble remember all calicum pills (doing better this)  Using straws: No Drinking while eating: No Hair loss: No Carbonated beverages: No N/V/D/C:  taking miralax daily to keep her regular Dumping syndrome: No  Recent physical activity:  Walking everyday at least 30 minutes, yard work  Progress Towards Goal(s):  In progress.   Nutritional Diagnosis:  [place]-3.3 Overweight/obesity related to past poor dietary habits and physical inactivity as evidenced by patient w/ recent sleeve gastrectomy surgery following dietary guidelines for continued weight loss.    Intervention:  Nutrition education/diet advancement. Goals:  Follow Phase 3B: High Protein + Non-Starchy  Vegetables  Eat 3-6 small meals/snacks, every 3-5 hrs  Increase lean protein foods to meet 60g goal  Increase fluid intake to 64oz +  Avoid drinking 15 minutes before, during and 30 minutes after eating  Aim for >30 min of physical activity daily  Think about adding weight bearing activity - look into joining a gym, pilates/yoga, water fitness    Eat protein foods first, then non starchy vegetables and add fruit if you have room (every once in awhile)   Teaching Method Utilized:  Visual Auditory Hands on  Barriers to learning/adherence to lifestyle change: none  Demonstrated degree of understanding via:  Teach Back   Monitoring/Evaluation:  Dietary intake, exercise, and body weight. Follow up in 3  months for 6 month post-op visit.

## 2014-06-27 NOTE — Patient Instructions (Addendum)
Goals:  Follow Phase 3B: High Protein + Non-Starchy Vegetables  Eat 3-6 small meals/snacks, every 3-5 hrs  Increase lean protein foods to meet 60g goal  Increase fluid intake to 64oz +  Avoid drinking 15 minutes before, during and 30 minutes after eating  Aim for >30 min of physical activity daily  Think about adding weight bearing activity - look into joining a gym, pilates/yoga, water fitness    Eat protein foods first, then non starchy vegetables and add fruit if you have room (every once in awhile)  Surgery date: 04/05/2014 Surgery type: Gastric sleeve Start weight at [facility]: 269 lbs on 01/08/2014 Weight today: 219 lbs  Weight change: 11 lbs  Total weight loss: 50 lbs  TANITA  BODY COMP RESULTS  03/14/14 04/19/14 05/31/14 06/27/14   BMI (kg/m^2) 47.7 43.0 40.7 38.8   Fat Mass (lbs) 134.5 123.5 98.5 99.5   Fat Free Mass (lbs) 135 119.0 131.5 119.5   Total Body Water (lbs) 99 87.0 96.5 87.5

## 2014-07-22 ENCOUNTER — Encounter: Payer: Self-pay | Admitting: Internal Medicine

## 2014-07-22 ENCOUNTER — Other Ambulatory Visit: Payer: Self-pay | Admitting: Internal Medicine

## 2014-08-10 ENCOUNTER — Telehealth: Payer: Self-pay

## 2014-08-10 NOTE — Telephone Encounter (Signed)
Pt came upstairs and asked for lab work before biatric appt in September.   Orders entered for labs to be drawn.

## 2014-08-31 ENCOUNTER — Other Ambulatory Visit (INDEPENDENT_AMBULATORY_CARE_PROVIDER_SITE_OTHER): Payer: 59

## 2014-08-31 LAB — CBC WITH DIFFERENTIAL/PLATELET
BASOS PCT: 0.5 % (ref 0.0–3.0)
Basophils Absolute: 0 10*3/uL (ref 0.0–0.1)
EOS PCT: 1.8 % (ref 0.0–5.0)
Eosinophils Absolute: 0.1 10*3/uL (ref 0.0–0.7)
HCT: 38.2 % (ref 36.0–46.0)
Hemoglobin: 13 g/dL (ref 12.0–15.0)
LYMPHS PCT: 28.7 % (ref 12.0–46.0)
Lymphs Abs: 1.6 10*3/uL (ref 0.7–4.0)
MCHC: 34 g/dL (ref 30.0–36.0)
MCV: 85.4 fl (ref 78.0–100.0)
Monocytes Absolute: 0.4 10*3/uL (ref 0.1–1.0)
Monocytes Relative: 6.4 % (ref 3.0–12.0)
Neutro Abs: 3.5 10*3/uL (ref 1.4–7.7)
Neutrophils Relative %: 62.6 % (ref 43.0–77.0)
Platelets: 211 10*3/uL (ref 150.0–400.0)
RBC: 4.47 Mil/uL (ref 3.87–5.11)
RDW: 13.5 % (ref 11.5–15.5)
WBC: 5.7 10*3/uL (ref 4.0–10.5)

## 2014-08-31 LAB — COMPREHENSIVE METABOLIC PANEL
ALT: 11 U/L (ref 0–35)
AST: 36 U/L (ref 0–37)
Albumin: 4.1 g/dL (ref 3.5–5.2)
Alkaline Phosphatase: 151 U/L — ABNORMAL HIGH (ref 39–117)
BILIRUBIN TOTAL: 0.4 mg/dL (ref 0.2–1.2)
BUN: 17 mg/dL (ref 6–23)
CO2: 26 meq/L (ref 19–32)
Calcium: 9.6 mg/dL (ref 8.4–10.5)
Chloride: 104 mEq/L (ref 96–112)
Creatinine, Ser: 0.66 mg/dL (ref 0.40–1.20)
GFR: 99.42 mL/min (ref >60.00)
Glucose, Bld: 97 mg/dL (ref 70–99)
POTASSIUM: 4.2 meq/L (ref 3.5–5.1)
SODIUM: 139 meq/L (ref 135–145)
TOTAL PROTEIN: 7.3 g/dL (ref 6.0–8.3)

## 2014-08-31 LAB — LIPID PANEL
CHOL/HDL RATIO: 3
CHOLESTEROL: 170 mg/dL (ref 0–200)
HDL: 61.1 mg/dL (ref >39.00)
LDL Cholesterol: 91 mg/dL (ref 0–99)
NONHDL: 108.9
Triglycerides: 91 mg/dL (ref 0.0–149.0)
VLDL: 18.2 mg/dL (ref 0.0–40.0)

## 2014-08-31 LAB — IBC PANEL
IRON: 77 ug/dL (ref 42–145)
SATURATION RATIOS: 20.3 % (ref 20.0–50.0)
TRANSFERRIN: 271 mg/dL (ref 212.0–360.0)

## 2014-08-31 LAB — HEMOGLOBIN A1C: HEMOGLOBIN A1C: 5.4 % (ref 4.6–6.5)

## 2014-08-31 LAB — VITAMIN B12: Vitamin B-12: 750 pg/mL (ref 211–911)

## 2014-09-01 LAB — FOLATE RBC: RBC Folate: 768 ng/mL (ref >280)

## 2014-09-06 LAB — VITAMIN B1: VITAMIN B1 (THIAMINE): 19 nmol/L (ref 8–30)

## 2014-09-27 ENCOUNTER — Encounter: Payer: Self-pay | Admitting: Dietician

## 2014-09-27 ENCOUNTER — Encounter: Payer: 59 | Attending: Surgery | Admitting: Dietician

## 2014-09-27 VITALS — Ht 63.0 in | Wt 197.0 lb

## 2014-09-27 DIAGNOSIS — Z6834 Body mass index (BMI) 34.0-34.9, adult: Secondary | ICD-10-CM | POA: Insufficient documentation

## 2014-09-27 DIAGNOSIS — Z713 Dietary counseling and surveillance: Secondary | ICD-10-CM | POA: Insufficient documentation

## 2014-09-27 DIAGNOSIS — E669 Obesity, unspecified: Secondary | ICD-10-CM | POA: Insufficient documentation

## 2014-09-27 NOTE — Patient Instructions (Addendum)
Goals:  Follow Phase 3B: High Protein + Non-Starchy Vegetables  Eat 3-6 small meals/snacks, every 3-5 hrs  Increase lean protein foods to meet 60g goal  Increase fluid intake to 64oz +  Avoid drinking 15 minutes before, during and 30 minutes after eating  Aim for >30 min of physical activity daily - start BELT program  Eat protein foods first, then non starchy vegetables and add fruit if you have room (every once in awhile)  Surgery date: 04/05/2014 Surgery type: Gastric sleeve Start weight at [facility]: 269 lbs on 01/08/2014 Weight today: 197 lbs  Weight change: 22 lbs  Total weight loss: 72 lbs Weight loss goal: 140-150 lbs  TANITA  BODY COMP RESULTS  03/14/14 04/19/14 05/31/14 06/27/14 09/27/14   BMI (kg/m^2) 47.7 43.0 40.7 38.8 34.9   Fat Mass (lbs) 134.5 123.5 98.5 99.5 79.0   Fat Free Mass (lbs) 135 119.0 131.5 119.5 118.0   Total Body Water (lbs) 99 87.0 96.5 87.5 86.5

## 2014-09-27 NOTE — Progress Notes (Signed)
  Follow-up visit:  6 Months Post-Operative Sleeve Gastrectomy Surgery  Medical Nutrition Therapy:  Appt start time: 500 end time:  938  Primary concerns today: Post-operative Bariatric Surgery Nutrition Management. Returns with a 22 lbs weight loss. Has not been as active lately d/t the hot weather. Trying to register for the BELT program. Feeling well overall. Can tolerate all foods.  Surgery date: 04/05/2014 Surgery type: Gastric sleeve Start weight at [facility]: 269 lbs on 01/08/2014 Weight today: 197 lbs  Weight change: 22 lbs  Total weight loss: 72 lbs Weight loss goal: 140-150 lbs  TANITA  BODY COMP RESULTS  03/14/14 04/19/14 05/31/14 06/27/14 09/27/14   BMI (kg/m^2) 47.7 43.0 40.7 38.8 34.9   Fat Mass (lbs) 134.5 123.5 98.5 99.5 79.0   Fat Free Mass (lbs) 135 119.0 131.5 119.5 118.0   Total Body Water (lbs) 99 87.0 96.5 87.5 86.5    Preferred Learning Style:   No preference indicated   Learning Readiness:   Ready  24-hr recall: B (AM): Premier Protein or GNC protein (25-30 g) Snk (AM): scrambled egg (6 g) L (PM): 3 oz chicken or chicken salad (21 g) Snk (PM):1/2 atkins shake (7 g) D (PM): 3 oz of chicken or 3/4 wendy's small chili or sausage patties with cheese (14-21 g) Snk (PM): SF popscile  Fluid intake: 16 oz of protein shake, 36 oz water (68 oz) Estimated total protein intake: 71-85 g  Medications:  See list  Supplementation:  Taking  Using straws: No Drinking while eating: No Hair loss: No - though some is breaking off Carbonated beverages: No N/V/D/C:  taking miralax daily to keep her regular Dumping syndrome: No  Recent physical activity:  Walking sometimes, yard work  Progress Towards Goal(s):  In progress.   Nutritional Diagnosis:  [place]-3.3 Overweight/obesity related to past poor dietary habits and physical inactivity as evidenced by patient w/ recent sleeve gastrectomy surgery following dietary guidelines for continued weight loss.    Intervention:   Nutrition education/diet advancement. Goals:  Follow Phase 3B: High Protein + Non-Starchy Vegetables  Eat 3-6 small meals/snacks, every 3-5 hrs  Increase lean protein foods to meet 60g goal  Increase fluid intake to 64oz +  Avoid drinking 15 minutes before, during and 30 minutes after eating  Aim for >30 min of physical activity daily - start BELT program  Eat protein foods first, then non starchy vegetables and add fruit if you have room (every once in awhile)   Teaching Method Utilized:  Visual Auditory Hands on  Barriers to learning/adherence to lifestyle change: none  Demonstrated degree of understanding via:  Teach Back   Monitoring/Evaluation:  Dietary intake, exercise, and body weight. Follow up in 3 months for 9 month post-op visit.

## 2014-11-09 ENCOUNTER — Telehealth: Payer: Self-pay | Admitting: Family Medicine

## 2014-11-09 NOTE — Telephone Encounter (Signed)
Spoke to Suzanne Schaefer, she is waiting to decide if she would rather see dr Tamala Julian or dr Alain Marion for this problem.

## 2014-11-09 NOTE — Telephone Encounter (Signed)
Patient would like to know if she needs to come in to see Dr. Tamala Julian.  She is having craps in both feet (mainly left).  It starts from the big toe joint and run up to ankle through to calf muscle.

## 2014-11-09 NOTE — Telephone Encounter (Signed)
Spoke to pt, scheduled her for November 1 @ 8am with dr Tamala Julian.

## 2014-11-25 LAB — HM MAMMOGRAPHY

## 2014-12-06 ENCOUNTER — Encounter: Payer: Self-pay | Admitting: Family Medicine

## 2014-12-06 ENCOUNTER — Ambulatory Visit (INDEPENDENT_AMBULATORY_CARE_PROVIDER_SITE_OTHER): Payer: 59 | Admitting: Family Medicine

## 2014-12-06 ENCOUNTER — Other Ambulatory Visit (INDEPENDENT_AMBULATORY_CARE_PROVIDER_SITE_OTHER): Payer: 59

## 2014-12-06 VITALS — BP 128/70 | HR 60 | Ht 62.0 in | Wt 188.0 lb

## 2014-12-06 DIAGNOSIS — M7752 Other enthesopathy of left foot: Secondary | ICD-10-CM | POA: Diagnosis not present

## 2014-12-06 DIAGNOSIS — M25579 Pain in unspecified ankle and joints of unspecified foot: Secondary | ICD-10-CM

## 2014-12-06 MED ORDER — DICLOFENAC SODIUM 2 % TD SOLN: TRANSDERMAL | Status: DC

## 2014-12-06 NOTE — Assessment & Plan Note (Signed)
Patient does have what appears to be more of a capsulitis of the tone is likely secondary to possibly some underlying gouty deposits. We discussed with patient possibly losing weight as well as nonabsorbing certain medications that this could be giving her some mild difficulty. Patient has increased her activity. We discussed proper rate changing shoes and showed how patient can transition her orthotics to different shoes. Patient's orthotics seem to be in good repair. We discussed the possibility of injection which patient declined. Patient will try topical anti-inflammatories. Patient will monitor diet. Patient will come back and see me again in 3-4 weeks. Continued have pain I would be more inclined to do the injection would likely aspiration and possibly lab tests to further evaluate the amount of uric acid could be contribute in.

## 2014-12-06 NOTE — Progress Notes (Signed)
  Suzanne Schaefer [facility] [address] [place], [place] [address] Phone: [phone] Subjective:     CC: bilateral foot pain and cramping.   [id number] Suzanne Schaefer is a 54 y.o. female coming in with complaint of foot pain bilaterally left good and right. Seems to be more in the large toe on the left side. Patient has had foot pain previously and did have custom orthotics. Patient has been wearing them but has not been able to switch into other shoes. States that she is only been wearing one pair shoes and it seems like they're not doing very well. Patient has been doing some anti-inflammatories from time to time. Continues to take the over-the-counter natural supplementations. States though that at the end of the day she has cramping in this toe. Has had difficulty with iron before and is not taking any at this time. Patient states that it is not affecting her daily activities but at the end of the day is significant amount of pain that can even wake her up at night. Patient does not want this pain get worse and rates it as 7 out of 10.     Past medical history, social, surgical and family history all reviewed in electronic medical record.   Review of Systems: No headache, visual changes, nausea, vomiting, diarrhea, constipation, dizziness, abdominal pain, skin rash, fevers, chills, night sweats, weight loss, swollen lymph nodes, body aches, joint swelling, muscle aches, chest pain, shortness of breath, mood changes.   Objective Blood pressure 128/70, pulse 60, height 5\' 2"  (1.575 m), weight 188 lb (85.276 kg), SpO2 98 %.  General: No apparent distress alert and oriented x3 mood and affect normal, dressed appropriately.  HEENT: Pupils equal, extraocular movements intact  Respiratory: Patient's speak in full sentences and does not appear short of breath  Cardiovascular: No lower extremity edema, non tender, no erythema  Skin: Warm dry intact with no signs of infection or  rash on extremities or on axial skeleton.  Abdomen: Soft nontender  Neuro: Cranial nerves II through XII are intact, neurovascularly intact in all extremities with 2+ DTRs and 2+ pulses.  Lymph: No lymphadenopathy of posterior or anterior cervical chain or axillae bilaterally.  Gait normal with good balance and coordination.  MSK:  Non tender with full range of motion and good stability and symmetric strength and tone of shoulders, elbows, wrist, hip, knee and ankles bilaterally.  Foot exam shows the patient does have severe loss of the transverse arch bilaterally as well as some mild pes planus. Mild overpronation of the hindfoot. Patient does have some mild hallux limitus of the first toe but no rigidus noted. Neurovascularly intact distally.  Limited musculoskeletal ultrasound was performed and interpreted by Hulan Saas, M  Limited ultrasound patient's first toe on the left foot shows the patient does have some mild capsulitis and has what appears to be possibly a double line that is consistent with gout. No significant fracture noted. Minimal arthritic changes noted. Impression: Capsulitis of the first toe on the left side with possible gouty infiltrate.   Impression and Recommendations:     This case required medical decision making of moderate complexity.

## 2014-12-06 NOTE — Patient Instructions (Signed)
Good to see you Continue the orthotics and switch shoes Ice can help Avoid being barefoot Try uloric daily for next week and if you like it then we will do a prescription.  pennsaid pinkie amount topically 2 times daily as needed.  See me again in 3 weeks if we need to do injection.

## 2014-12-06 NOTE — Progress Notes (Signed)
Pre visit review using our clinic review tool, if applicable. No additional management support is needed unless otherwise documented below in the visit note. 

## 2014-12-08 ENCOUNTER — Encounter: Payer: Self-pay | Admitting: Internal Medicine

## 2014-12-12 ENCOUNTER — Telehealth: Payer: Self-pay | Admitting: Dietician

## 2014-12-12 NOTE — Telephone Encounter (Signed)
Surgery date: 04/05/2014 Surgery type: Gastric sleeve Start weight at [facility]: 269 lbs on 01/08/2014 Weight today: 186.5 lbs  Weight change: 32.5 lbs  Total weight loss: 82.5 lbs  TANITA BODY COMP RESULTS  03/14/14 04/19/14 05/31/14 06/27/14 12/12/14  BMI (kg/m^2) 47.7 43.0 40.7 38.8 33.0  Fat Mass (lbs) 134.5 123.5 98.5 99.5 63.0  Fat Free Mass (lbs) 135 119.0 131.5 119.5 123.5  Total Body Water (lbs) 99 87.0 96.5 87.5 90.5

## 2014-12-27 ENCOUNTER — Ambulatory Visit (INDEPENDENT_AMBULATORY_CARE_PROVIDER_SITE_OTHER): Payer: 59 | Admitting: Family Medicine

## 2014-12-27 ENCOUNTER — Other Ambulatory Visit (INDEPENDENT_AMBULATORY_CARE_PROVIDER_SITE_OTHER): Payer: 59

## 2014-12-27 ENCOUNTER — Encounter: Payer: Self-pay | Admitting: Family Medicine

## 2014-12-27 VITALS — BP 122/70 | HR 60 | Ht 62.0 in | Wt 188.0 lb

## 2014-12-27 DIAGNOSIS — M7752 Other enthesopathy of left foot: Secondary | ICD-10-CM | POA: Diagnosis not present

## 2014-12-27 MED ORDER — MELOXICAM 15 MG PO TABS: 15.0000 mg | ORAL_TABLET | Freq: Every day | ORAL | Status: DC

## 2014-12-27 NOTE — Patient Instructions (Signed)
Good to see you Vitamin D 2000 IU 3 times a week Magnesium 400mg  at night Stay active and continue the good shoes Take meloxicam daily for next 2-3 weeks.  See me again in 4 weeks if not perfect

## 2014-12-27 NOTE — Assessment & Plan Note (Signed)
Patient overall seems to be doing relatively well. Patient will try oral anti-inflammatories. We encouraged patient continue the exercises. We discussed over-the-counter natural supplementations. Patient will come back and see me again in 3-4 weeks. If worsening symptoms I would consider injection in the first metatarsal joint. This will help diagnostically as well as potentially therapeutically.

## 2014-12-27 NOTE — Progress Notes (Signed)
Pre visit review using our clinic review tool, if applicable. No additional management support is needed unless otherwise documented below in the visit note. 

## 2014-12-27 NOTE — Progress Notes (Signed)
  Suzanne Schaefer [facility] [address] [place], [place] [address] Phone: [phone] Subjective:     CC: bilateral foot pain and cramping follow up  [id number] Suzanne Schaefer is a 54 y.o. female coming in with complaint of foot pain bilaterally left  Being the wrose. Patient states it only happens at night. Patient is taking the iron supplementations. Patient does not want to have an injection is necessary. Patient states it's worse during the week and then seems to get somewhat better during the weekend. Patient continues to try to wear the orthotics on a regular basis. Denies any significant pain throughout the day. Patient was given a trial of medication for gout that did not make any significant improvement.    Past medical history, social, surgical and family history all reviewed in electronic medical record.   Review of Systems: No headache, visual changes, nausea, vomiting, diarrhea, constipation, dizziness, abdominal pain, skin rash, fevers, chills, night sweats, weight loss, swollen lymph nodes, body aches, joint swelling, muscle aches, chest pain, shortness of breath, mood changes.   Objective Blood pressure 122/70, pulse 60, height 5\' 2"  (1.575 m), weight 188 lb (85.276 kg), SpO2 98 %.  General: No apparent distress alert and oriented x3 mood and affect normal, dressed appropriately.  HEENT: Pupils equal, extraocular movements intact  Respiratory: Patient's speak in full sentences and does not appear short of breath  Cardiovascular: No lower extremity edema, non tender, no erythema  Skin: Warm dry intact with no signs of infection or rash on extremities or on axial skeleton.  Abdomen: Soft nontender  Neuro: Cranial nerves II through XII are intact, neurovascularly intact in all extremities with 2+ DTRs and 2+ pulses.  Lymph: No lymphadenopathy of posterior or anterior cervical chain or axillae bilaterally.  Gait normal with good balance and coordination.    MSK:  Non tender with full range of motion and good stability and symmetric strength and tone of shoulders, elbows, wrist, hip, knee and ankles bilaterally.  Foot exam shows the patient does have severe loss of the transverse arch bilaterally as well as some mild pes planus. Mild overpronation of the hindfoot. Patient does have some mild hallux limitus of the first toe but no rigidus noted. Neurovascularly intact distally. No significant change from previous exam Limited musculoskeletal ultrasound was performed and interpreted by Hulan Saas, M  Limited ultrasound patient's first toe on the left foot shows the patient does have some mild capsulitis longer having the double line. No significant tendinitis noted Impression: Capsulitis of the first toe .   Impression and Recommendations:     This case required medical decision making of moderate complexity.

## 2015-01-03 ENCOUNTER — Encounter: Payer: 59 | Attending: Internal Medicine | Admitting: Dietician

## 2015-01-03 ENCOUNTER — Encounter: Payer: Self-pay | Admitting: Dietician

## 2015-01-03 DIAGNOSIS — Z713 Dietary counseling and surveillance: Secondary | ICD-10-CM | POA: Diagnosis not present

## 2015-01-03 DIAGNOSIS — Z6832 Body mass index (BMI) 32.0-32.9, adult: Secondary | ICD-10-CM | POA: Insufficient documentation

## 2015-01-03 NOTE — Progress Notes (Signed)
  Follow-up visit:  9 Months Post-Operative Sleeve Gastrectomy Surgery  Medical Nutrition Therapy:  Appt start time: 820 end time:  [id number]  Primary concerns today: Post-operative Bariatric Surgery Nutrition Management. Returns with a 2 lbs weight loss since November 7th. Has been doing the BELT program and has 3 weeks left. Thinking about doing the [place] program. Has not been active in the last few days d/t Thanksgiving holiday.   Feels like she is breaking through a plateau.   Still not having problems tolerating foods.   Surgery date: 04/05/2014 Surgery type: Gastric sleeve Start weight at [facility]: 269 lbs on 01/08/2014 Weight today: 184.5 lbs  Weight change: 2.0 lbs  Total weight loss: 84.5 lbs Weight loss goal: 140-150 lbs  TANITA BODY COMP RESULTS  03/14/14 04/19/14 05/31/14 06/27/14 09/27/14 12/12/14 01/03/15  BMI (kg/m^2) 47.7 43.0 40.7 38.8 34.9 33.0 32.7  Fat Mass (lbs) 134.5 123.5 98.5 99.5 79.0 63.0 71.0  Fat Free Mass (lbs) 135 119.0 131.5 119.5 118.0 123.5 113.5  Total Body Water (lbs) 99 87.0 96.5 87.5 86.5 90.5 83.0                  Preferred Learning Style:   No preference indicated   Learning Readiness:   Ready  24-hr recall: B (AM): Premier Protein or GNC protein (25-30 g) Snk (AM): scrambled egg (6 g) L (PM): 3 oz chicken or chicken salad (21 g) Snk (PM):none 1/2 atkins shake or [employer] and Fit (0-9 g) D (PM): 3 oz of chicken or 3/4 wendy's small chili or sausage patties with cheese (14-21 g) Snk (PM): SF popscile  Fluid intake: 16 oz of protein shake, 36-48 oz water (~64 oz) Estimated total protein intake: 71-85 g  Medications:  See list  Supplementation:  Taking and added 2000 IU of vitamin D  Using straws: sometimes if driving, not often Drinking while eating: No Hair loss: No - though some is breaking off Carbonated beverages: No N/V/D/C:  taking miralax daily to keep her  regular Dumping syndrome: No  Recent physical activity:  BELT program MWF  Progress Towards Goal(s):  In progress.   Nutritional Diagnosis:  [place]-3.3 Overweight/obesity related to past poor dietary habits and physical inactivity as evidenced by patient w/ recent sleeve gastrectomy surgery following dietary guidelines for continued weight loss.    Intervention:  Nutrition education/diet reinforcement Goals:  Follow Phase 3B: High Protein + Non-Starchy Vegetables  Eat 3-6 small meals/snacks, every 3-5 hrs  Increase lean protein foods to meet 60g goal  Increase fluid intake to 64oz +  Avoid drinking 15 minutes before, during and 30 minutes after eating  Aim for >30 min of physical activity daily - Look into [facility] or another option for after BELT is complete  Add vegetables to lunch and dinner  Continue having snacks only if you are hungry   Teaching Method Utilized:  Visual Auditory Hands on  Barriers to learning/adherence to lifestyle change: none  Demonstrated degree of understanding via:  Teach Back   Monitoring/Evaluation:  Dietary intake, exercise, and body weight. Follow up in 3 months for 12 month post-op visit.

## 2015-01-03 NOTE — Patient Instructions (Addendum)
Goals:  Follow Phase 3B: High Protein + Non-Starchy Vegetables  Eat 3-6 small meals/snacks, every 3-5 hrs  Increase lean protein foods to meet 60g goal  Increase fluid intake to 64oz +  Avoid drinking 15 minutes before, during and 30 minutes after eating  Aim for >30 min of physical activity daily - Look into [facility] or another option for after BELT is complete  Add vegetables to lunch and dinner  Continue having snacks only if you are hungry  Surgery date: 04/05/2014 Surgery type: Gastric sleeve Start weight at [facility]: 269 lbs on 01/08/2014 Weight today: 184.5 lbs  Weight change: 2.0 lbs  Total weight loss: 84.5 lbs Weight loss goal: 140-150 lbs  [facility]ANITA BODY COMP RESULTS  03/14/14 04/19/14 05/31/14 06/27/14 09/27/14 12/12/14 01/03/15  BMI (kg/m^2) 47.7 43.0 40.7 38.8 34.9 33.0 32.7  Fat Mass (lbs) 134.5 123.5 98.5 99.5 79.0 63.0 71.0  Fat Free Mass (lbs) 135 119.0 131.5 119.5 118.0 123.5 113.5  Total Body Water (lbs) 99 87.0 96.5 87.5 86.5 90.5 83.0

## 2015-01-25 ENCOUNTER — Ambulatory Visit: Payer: 59 | Admitting: Family Medicine

## 2015-02-08 ENCOUNTER — Other Ambulatory Visit (HOSPITAL_COMMUNITY)
Admission: RE | Admit: 2015-02-08 | Discharge: 2015-02-08 | Disposition: A | Discharge status: Home / Self Care | Payer: 59 | Source: Ambulatory Visit | Attending: Gynecology | Admitting: Gynecology

## 2015-02-08 ENCOUNTER — Ambulatory Visit (INDEPENDENT_AMBULATORY_CARE_PROVIDER_SITE_OTHER): Payer: 59 | Admitting: Gynecology

## 2015-02-08 ENCOUNTER — Encounter: Payer: Self-pay | Admitting: Gynecology

## 2015-02-08 VITALS — BP 124/86 | Ht 62.0 in | Wt 181.8 lb

## 2015-02-08 DIAGNOSIS — Z01411 Encounter for gynecological examination (general) (routine) with abnormal findings: Secondary | ICD-10-CM | POA: Insufficient documentation

## 2015-02-08 DIAGNOSIS — Z87410 Personal history of cervical dysplasia: Secondary | ICD-10-CM

## 2015-02-08 DIAGNOSIS — Z01419 Encounter for gynecological examination (general) (routine) without abnormal findings: Secondary | ICD-10-CM

## 2015-02-08 NOTE — Progress Notes (Signed)
Suzanne Schaefer 11/21/1960 [id number]   History:    55 y.o.  for annual gyn exam with no complaints today. In the next few weeks she will be seen her PCP Dr. Alain Marion who will be doing her blood work.  Patient with past history of vulvar condyloma. 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  Patient has also had in the past molluscum contagiosum in 2013  In 2011 patient had a negative GC and chlamydia culture as well as negative HIV and RPR  Normal mammogram October 2013  Left breast needle core biopsy 2012 benign fibroadenoma  History of hydrosalpinx post fimbriectomy 1998 as well as total salpingectomy 1996. Patient with past history of high-grade CIN I. LEEP cervical conization several years ago   Patient reports normal colonoscopy in 2006. Pap smear 2015 was normal. She also had a normal bone density study in 2015. Patient not sexually active no longer on hormone replacement therapy. She was on it for a brief time.  In March 2016 Dr. Rowe Clack margins performed a laparoscopic sleeve gastrectomy an upper endoscopy as well as repair of hiatal hernia and she has done well and has lost significant amount of weight. Last year she was weighing 271 pounds this year she's down to 181 pounds.  Past medical history,surgical history, family history and social history were all reviewed and documented in the EPIC chart.  Gynecologic History No LMP recorded. Patient is not currently having periods (Reason: Perimenopausal). Contraception: post menopausal status Last Pap: 2015. Results were: normal Last mammogram: 2016. Results were: normal  Obstetric History OB History  Gravida Para Term Preterm AB SAB TAB Ectopic Multiple Living  2 2        2     # Outcome Date GA Lbr Len/2nd  Weight Sex Delivery Anes PTL Lv  2 Para           1 Para                ROS: A ROS was performed and pertinent positives and negatives are included in the history.  GENERAL: No fevers or chills. HEENT: No change in vision, no earache, sore throat or sinus congestion. NECK: No pain or stiffness. CARDIOVASCULAR: No chest pain or pressure. No palpitations. PULMONARY: No shortness of breath, cough or wheeze. GASTROINTESTINAL: No abdominal pain, nausea, vomiting or diarrhea, melena or bright red blood per rectum. GENITOURINARY: No urinary frequency, urgency, hesitancy or dysuria. MUSCULOSKELETAL: No joint or muscle pain, no back pain, no recent trauma. DERMATOLOGIC: No rash, no itching, no lesions. ENDOCRINE: No polyuria, polydipsia, no heat or cold intolerance. No recent change in weight. HEMATOLOGICAL: No anemia or easy bruising or bleeding. NEUROLOGIC: No headache, seizures, numbness, tingling or weakness. PSYCHIATRIC: No depression, no loss of interest in normal activity or change in sleep pattern.     Exam: chaperone present  BP 124/86 mmHg  Ht 5\' 2"  (1.575 m)  Wt 181 lb 12.8 oz (82.464 kg)  BMI 33.24 kg/m2  Body mass index is 33.24 kg/(m^2).  General appearance : Well developed well nourished female. No acute distress HEENT: Eyes: no retinal hemorrhage or exudates,  Neck supple, trachea midline, no carotid bruits, no thyroidmegaly Lungs: Clear to auscultation, no rhonchi or wheezes, or rib retractions  Heart: Regular rate and rhythm, no murmurs or gallops Breast:Examined in sitting and supine position were symmetrical in appearance, no palpable masses or tenderness,  no skin retraction, no nipple inversion, no nipple discharge, no skin discoloration, no axillary or supraclavicular lymphadenopathy Abdomen: no palpable masses or tenderness, no rebound or guarding Extremities: no edema or skin discoloration or tenderness  Pelvic:  Bartholin, Urethra, Skene Glands: Within normal limits              Vagina: No gross lesions or discharge  Cervix: No gross lesions or discharge  Uterus  anteverted, normal size, shape and consistency, non-tender and mobile  Adnexa  Without masses or tenderness  Anus and perineum  normal   Rectovaginal  normal sphincter tone without palpated masses or tenderness             Hemoccult PCP provides     Assessment/Plan:  55 y.o. female for annual exam postmenopausal on no hormone replacement therapy not sexually active. Normal exam today. Pap smear done today. PCP we'll be doing her blood work. Patient was reminded take her calcium vitamin D and regular exercise for osteoporosis prevention. Patient will need a bone density study next year along with her colonoscopy.   Terrance Mass MD, 9:08 AM 02/08/2015

## 2015-02-08 NOTE — Addendum Note (Signed)
Addended by: Thurnell Garbe A on: 02/08/2015 12:41 PM   Modules accepted: Orders, SmartSet

## 2015-02-08 NOTE — Addendum Note (Signed)
Addended by: Thurnell Garbe A on: 02/08/2015 09:32 AM   Modules accepted: Orders, SmartSet

## 2015-02-10 LAB — CYTOLOGY - PAP

## 2015-02-15 ENCOUNTER — Ambulatory Visit (INDEPENDENT_AMBULATORY_CARE_PROVIDER_SITE_OTHER): Payer: 59 | Admitting: Internal Medicine

## 2015-02-15 ENCOUNTER — Encounter: Payer: Self-pay | Admitting: Internal Medicine

## 2015-02-15 VITALS — BP 100/60 | HR 64 | Ht 62.0 in | Wt 181.0 lb

## 2015-02-15 DIAGNOSIS — Z9884 Bariatric surgery status: Secondary | ICD-10-CM

## 2015-02-15 DIAGNOSIS — Z0000 Encounter for general adult medical examination without abnormal findings: Secondary | ICD-10-CM | POA: Diagnosis not present

## 2015-02-15 MED ORDER — ROPINIROLE HCL 0.25 MG PO TABS: 0.7500 mg | ORAL_TABLET | Freq: Every day | ORAL | Status: DC

## 2015-02-15 MED ORDER — PANTOPRAZOLE SODIUM 40 MG PO TBEC: 40.0000 mg | DELAYED_RELEASE_TABLET | Freq: Every day | ORAL | Status: DC

## 2015-02-15 NOTE — Patient Instructions (Signed)
prev

## 2015-02-15 NOTE — Progress Notes (Signed)
Pre visit review using our clinic review tool, if applicable. No additional management support is needed unless otherwise documented below in the visit note. 

## 2015-02-15 NOTE — Assessment & Plan Note (Signed)
04/05/14 Dr Hassell Done

## 2015-02-15 NOTE — Assessment & Plan Note (Addendum)
We discussed age appropriate health related issues, including available/recomended screening tests and vaccinations. We discussed a need for adhering to healthy diet and exercise. Labs/EKG were reviewed/ordered. All questions were answered.   

## 2015-02-15 NOTE — Progress Notes (Signed)
Subjective:  Patient ID: Suzanne Schaefer, female    DOB: 1960-05-20  Age: 55 y.o. MRN: [id number]  CC: Annual Exam   HPI Suzanne Schaefer presents for a well exam. Pt lost wt after surgery  Outpatient Prescriptions Prior to Visit  Medication Sig Dispense Refill  . Calcium-Magnesium-Vitamin D (CITRACAL CALCIUM+D PO) Take 2 capsules by mouth every morning.     . Cetirizine-Pseudoephedrine (ZYRTEC-D PO) Take 1 tablet by mouth every morning.     . Cholecalciferol (VITAMIN D-3) 1000 UNITS CAPS Take 2 capsules by mouth 3 (three) times a week.     . Diclofenac Sodium 2 % SOLN Apply 1 pump twice daily. (Patient taking differently: Apply 1 pump twice daily as needed.) 112 g 3  . diphenhydramine-acetaminophen (TYLENOL PM) 25-500 MG TABS Take 1 tablet by mouth at bedtime as needed (aches and sleep.).     Marland Kitchen eszopiclone (LUNESTA) 2 MG TABS tablet Take 1 tablet (2 mg total) by mouth at bedtime as needed for sleep. Take immediately before bedtime (Patient taking differently: Take 2 mg by mouth at bedtime as needed for sleep. Takes mainly on Friday and Saturday night.) 30 tablet 2  . fluticasone (FLONASE) 50 MCG/ACT nasal spray Place 2 sprays into both nostrils at bedtime.    Marland Kitchen ibuprofen (ADVIL,MOTRIN) 200 MG tablet Take 600 mg by mouth every 6 (six) hours as needed for headache or moderate pain.    . meloxicam (MOBIC) 15 MG tablet Take 1 tablet (15 mg total) by mouth daily. 90 tablet 1  . pantoprazole (PROTONIX) 40 MG tablet Take 1 tablet (40 mg total) by mouth daily. 90 tablet 3  . Polyethylene Glycol 3350 (MIRALAX PO) Take 1 scoop by mouth at bedtime.     Marland Kitchen rOPINIRole (REQUIP) 0.25 MG tablet Take 3 tablets (0.75 mg total) by mouth at bedtime. 270 tablet 3   No facility-administered medications prior to visit.    ROS Review of Systems  Constitutional: Negative for chills, activity change, appetite change, fatigue and unexpected weight change.  HENT: Negative for congestion, mouth sores and sinus  pressure.   Eyes: Negative for visual disturbance.  Respiratory: Negative for cough and chest tightness.   Gastrointestinal: Negative for nausea and abdominal pain.  Genitourinary: Negative for frequency, difficulty urinating and vaginal pain.  Musculoskeletal: Negative for back pain and gait problem.  Skin: Negative for pallor and rash.  Neurological: Negative for dizziness, tremors, weakness, numbness and headaches.  Psychiatric/Behavioral: Negative for confusion and sleep disturbance.    Objective:  BP 100/60 mmHg  Pulse 64  Ht 5\' 2"  (1.575 m)  Wt 181 lb (82.101 kg)  BMI 33.10 kg/m2  SpO2 99%  BP Readings from Last 3 Encounters:  02/15/15 100/60  02/08/15 124/86  12/27/14 122/70    Wt Readings from Last 3 Encounters:  02/15/15 181 lb (82.101 kg)  02/08/15 181 lb 12.8 oz (82.464 kg)  01/03/15 184 lb 8 oz (83.689 kg)    Physical Exam  Constitutional: She appears well-developed. No distress.  HENT:  Head: Normocephalic.  Right Ear: External ear normal.  Left Ear: External ear normal.  Nose: Nose normal.  Mouth/Throat: Oropharynx is clear and moist.  Eyes: Conjunctivae are normal. Pupils are equal, round, and reactive to light. Right eye exhibits no discharge. Left eye exhibits no discharge.  Neck: Normal range of motion. Neck supple. No JVD present. No tracheal deviation present. No thyromegaly present.  Cardiovascular: Normal rate, regular rhythm and normal heart sounds.   Pulmonary/Chest: No  stridor. No respiratory distress. She has no wheezes.  Abdominal: Soft. Bowel sounds are normal. She exhibits no distension and no mass. There is no tenderness. There is no rebound and no guarding.  Musculoskeletal: She exhibits no edema or tenderness.  Lymphadenopathy:    She has no cervical adenopathy.  Neurological: She displays normal reflexes. No cranial nerve deficit. She exhibits normal muscle tone. Coordination normal.  Skin: No rash noted. No erythema.  Psychiatric: She  has a normal mood and affect. Her behavior is normal. Judgment and thought content normal.  Obese  Lab Results  Component Value Date   WBC 5.7 08/31/2014   HGB 13.0 08/31/2014   HCT 38.2 08/31/2014   PLT 211.0 08/31/2014   GLUCOSE 97 08/31/2014   CHOL 170 08/31/2014   TRIG 91.0 08/31/2014   HDL 61.10 08/31/2014   LDLDIRECT 115.2 01/27/2013   LDLCALC 91 08/31/2014   ALT 11 08/31/2014   AST 36 08/31/2014   NA 139 08/31/2014   K 4.2 08/31/2014   CL 104 08/31/2014   CREATININE 0.66 08/31/2014   BUN 17 08/31/2014   CO2 26 08/31/2014   TSH 1.37 02/14/2014   HGBA1C 5.4 08/31/2014    No results found.  Assessment & Plan:   There are no diagnoses linked to this encounter. I am having Ms. Bezanson maintain her Calcium-Magnesium-Vitamin D (CITRACAL CALCIUM+D PO), Polyethylene Glycol 3350 (MIRALAX PO), Cetirizine-Pseudoephedrine (ZYRTEC-D PO), eszopiclone, diphenhydramine-acetaminophen, rOPINIRole, pantoprazole, Vitamin D-3, fluticasone, ibuprofen, Diclofenac Sodium, and meloxicam.  No orders of the defined types were placed in this encounter.     Follow-up: No Follow-up on file.  Enslow Kehr, MD

## 2015-02-23 ENCOUNTER — Emergency Department (HOSPITAL_COMMUNITY): Payer: 59

## 2015-02-23 ENCOUNTER — Encounter (HOSPITAL_COMMUNITY): Payer: Self-pay | Admitting: Emergency Medicine

## 2015-02-23 ENCOUNTER — Inpatient Hospital Stay (HOSPITAL_COMMUNITY)
Admission: EM | Admit: 2015-02-23 | Discharge: 2015-02-25 | DRG: 440 | Disposition: A | Discharge status: Home / Self Care | Payer: 59 | Attending: Internal Medicine | Admitting: Internal Medicine

## 2015-02-23 ENCOUNTER — Other Ambulatory Visit: Payer: Self-pay | Admitting: Surgery

## 2015-02-23 DIAGNOSIS — Z79899 Other long term (current) drug therapy: Secondary | ICD-10-CM

## 2015-02-23 DIAGNOSIS — Z9884 Bariatric surgery status: Secondary | ICD-10-CM | POA: Diagnosis not present

## 2015-02-23 DIAGNOSIS — R1013 Epigastric pain: Secondary | ICD-10-CM | POA: Diagnosis not present

## 2015-02-23 DIAGNOSIS — R740 Nonspecific elevation of levels of transaminase and lactic acid dehydrogenase [LDH]: Secondary | ICD-10-CM | POA: Diagnosis not present

## 2015-02-23 DIAGNOSIS — Z6832 Body mass index (BMI) 32.0-32.9, adult: Secondary | ICD-10-CM | POA: Diagnosis not present

## 2015-02-23 DIAGNOSIS — E669 Obesity, unspecified: Secondary | ICD-10-CM | POA: Diagnosis not present

## 2015-02-23 DIAGNOSIS — R7989 Other specified abnormal findings of blood chemistry: Secondary | ICD-10-CM | POA: Diagnosis not present

## 2015-02-23 DIAGNOSIS — K219 Gastro-esophageal reflux disease without esophagitis: Secondary | ICD-10-CM | POA: Diagnosis present

## 2015-02-23 DIAGNOSIS — K859 Acute pancreatitis without necrosis or infection, unspecified: Principal | ICD-10-CM | POA: Diagnosis present

## 2015-02-23 DIAGNOSIS — R945 Abnormal results of liver function studies: Secondary | ICD-10-CM

## 2015-02-23 DIAGNOSIS — R7401 Elevation of levels of liver transaminase levels: Secondary | ICD-10-CM | POA: Insufficient documentation

## 2015-02-23 DIAGNOSIS — R110 Nausea: Secondary | ICD-10-CM

## 2015-02-23 DIAGNOSIS — Z9049 Acquired absence of other specified parts of digestive tract: Secondary | ICD-10-CM | POA: Diagnosis not present

## 2015-02-23 DIAGNOSIS — K858 Other acute pancreatitis without necrosis or infection: Secondary | ICD-10-CM | POA: Diagnosis not present

## 2015-02-23 LAB — COMPREHENSIVE METABOLIC PANEL
ALBUMIN: 4.4 g/dL (ref 3.5–5.0)
ALK PHOS: 214 U/L — AB (ref 38–126)
ALT: 1006 U/L — ABNORMAL HIGH (ref 14–54)
AST: 1204 U/L — AB (ref 15–41)
Anion gap: 8 (ref 5–15)
BILIRUBIN TOTAL: 0.8 mg/dL (ref 0.3–1.2)
BUN: 20 mg/dL (ref 6–20)
CALCIUM: 9.5 mg/dL (ref 8.9–10.3)
CO2: 27 mmol/L (ref 22–32)
Chloride: 104 mmol/L (ref 101–111)
Creatinine, Ser: 0.49 mg/dL (ref 0.44–1.00)
GFR calc Af Amer: >60 mL/min (ref >60)
GFR calc non Af Amer: >60 mL/min (ref >60)
GLUCOSE: 135 mg/dL — AB (ref 65–99)
POTASSIUM: 4.2 mmol/L (ref 3.5–5.1)
SODIUM: 139 mmol/L (ref 135–145)
TOTAL PROTEIN: 7.7 g/dL (ref 6.5–8.1)

## 2015-02-23 LAB — CBC
HEMATOCRIT: 38.4 % (ref 36.0–46.0)
Hemoglobin: 12.7 g/dL (ref 12.0–15.0)
MCH: 28.4 pg (ref 26.0–34.0)
MCHC: 33.1 g/dL (ref 30.0–36.0)
MCV: 85.9 fL (ref 78.0–100.0)
Platelets: 185 10*3/uL (ref 150–400)
RBC: 4.47 MIL/uL (ref 3.87–5.11)
RDW: 13.7 % (ref 11.5–15.5)
WBC: 8 10*3/uL (ref 4.0–10.5)

## 2015-02-23 LAB — URINALYSIS, ROUTINE W REFLEX MICROSCOPIC
BILIRUBIN URINE: NEGATIVE
Glucose, UA: NEGATIVE mg/dL
KETONES UR: 15 mg/dL — AB
Leukocytes, UA: NEGATIVE
NITRITE: NEGATIVE
PH: 5 (ref 5.0–8.0)
Protein, ur: NEGATIVE mg/dL
SPECIFIC GRAVITY, URINE: 1.034 — AB (ref 1.005–1.030)

## 2015-02-23 LAB — URINE MICROSCOPIC-ADD ON

## 2015-02-23 LAB — I-STAT TROPONIN, ED: TROPONIN I, POC: 0 ng/mL (ref 0.00–0.08)

## 2015-02-23 LAB — PROTIME-INR
INR: 1.05 (ref 0.00–1.49)
PROTHROMBIN TIME: 13.9 s (ref 11.6–15.2)

## 2015-02-23 LAB — LIPASE, BLOOD: LIPASE: 1947 U/L — AB (ref 11–51)

## 2015-02-23 MED ORDER — SODIUM CHLORIDE 0.9 % IV BOLUS (SEPSIS)
1000.0000 mL | Freq: Once | INTRAVENOUS | Status: AC
  Administered 2015-02-23: 1000 mL via INTRAVENOUS

## 2015-02-23 MED ORDER — ONDANSETRON 4 MG PO TBDP
4.0000 mg | ORAL_TABLET | Freq: Once | ORAL | Status: AC | PRN
  Administered 2015-02-23: 4 mg via ORAL
  Filled 2015-02-23: qty 1

## 2015-02-23 MED ORDER — IOHEXOL 300 MG/ML  SOLN
100.0000 mL | Freq: Once | INTRAMUSCULAR | Status: AC | PRN
  Administered 2015-02-23: 100 mL via INTRAVENOUS

## 2015-02-23 NOTE — ED Provider Notes (Signed)
CSN: [id number]     Arrival date & time 02/23/15  1430 History   First MD Initiated Contact with Patient 02/23/15 1942     Chief Complaint  Patient presents with  . Abdominal Pain  . Nausea  . Dizziness     (Consider location/radiation/quality/duration/timing/severity/associated sxs/prior Treatment) HPI Comments: Suzanne Schaefer is a 55 y.o. female with a PMHx of obesity s/p gastric sleeve surgery, esophagitis, GERD, and a PSHx of cholecystectomy, b/l tubal ligation, and gastric sleeve surgery, who presents to the ED with complaints of sudden onset epigastric abdominal pain that began at midnight last night. She describes her pain initially as 10/10 but now down to 2/10, constant gnawing in the epigastrium, waxing and waning in severity, nonradiating, worse with sitting forward or drinking fluids, and improved with Vicodin. She states she had some lightheadedness after taking Vicodin but this has resolved today. Associated symptoms include nausea.  She denies any fevers, chills, diaphoresis, chest pain, shortness of breath, vomiting, diarrhea, constipation, obstipation, melena, hematochezia, dysuria, hematuria, vaginal bleeding or discharge, myalgias, arthralgias, numbness, tingling, weakness, recent travel, sick contacts, suspicious food intake, alcohol use, or chronic NSAID use. She ate boiled chicken for dinner last night without issue. She has not eaten anything today. She has no recent scorpion bites. Recently had Hepatitis testing at her doctor's office which she states was "normal". She called her surgeon Dr. Hassell Done who recommended an upper GI (?CT) for tomorrow but pt states the pain was so severe that she couldn't wait.  Patient is a 55 y.o. female presenting with abdominal pain and dizziness. The history is provided by the patient. No language interpreter was used.  Abdominal Pain Pain location:  Epigastric Pain quality: gnawing   Pain radiates to:  Does not radiate Pain severity:   Severe Onset quality:  Sudden Duration:  20 hours Timing:  Constant Progression:  Waxing and waning Chronicity:  New Context: previous surgery   Context: not alcohol use, not recent travel, not sick contacts and not suspicious food intake   Relieved by: vicodin. Worsened by:  Position changes Ineffective treatments:  None tried Associated symptoms: nausea   Associated symptoms: no chest pain, no chills, no constipation, no diarrhea, no dysuria, no fever, no flatus, no hematemesis, no hematochezia, no hematuria, no melena, no shortness of breath, no vaginal bleeding, no vaginal discharge and no vomiting   Risk factors: no alcohol abuse and no NSAID use   Dizziness Associated symptoms: nausea   Associated symptoms: no blood in stool, no chest pain, no diarrhea, no shortness of breath, no vomiting and no weakness     Past Medical History  Diagnosis Date  . Restless leg syndrome   . Tonsillitis     nothing recent  . Esophagitis   . Allergic rhinitis     uses OTC meds  . History of sinusitis     no current problems  . Obesity   . Obstructive sleep apnea     no cpap currently.  Marland Kitchen GERD (gastroesophageal reflux disease)   . Arthritis     arthritis -knees ,ankles  . Complication of anesthesia   . PONV (postoperative nausea and vomiting)    Past Surgical History  Procedure Laterality Date  . Cervical biopsy    . Laparoscopic cholecystectomy  1992  . Bilat salpingectomy    . Cesarean section    . Tubal ligation  1988 1992  . Laparoscopic gastric sleeve resection N/A 04/05/2014    Procedure: LAPAROSCOPIC GASTRIC SLEEVE RESECTION with  hiatel hernia repair;  Surgeon: Pedro Earls, MD;  Location: [facility];  Service: General;  Laterality: N/A;  . Upper gi endoscopy  04/05/2014    Procedure: UPPER GI ENDOSCOPY;  Surgeon: Pedro Earls, MD;  Location: [facility];  Service: General;;   Family History  Problem Relation Age of Onset  . Asthma Mother   . COPD Mother   . Cancer Mother 36     PERITONEAL   . Hypertension Father   . Obesity Father   . Glaucoma Father   . Cancer Maternal Uncle     >20 colon polyps  . Cancer Paternal Uncle     blood cancer  . Cancer Paternal Grandfather     lymphoma  . Atrial fibrillation Father   . Allergies Mother     ? seasonal   Social History  Substance Use Topics  . Smoking status: Never Smoker   . Smokeless tobacco: Never Used  . Alcohol Use: 0.0 oz/week    0 Standard drinks or equivalent per week     Comment: rare occasional use   OB History    Gravida Para Term Preterm AB TAB SAB Ectopic Multiple Living   _0 Review of Systems  Constitutional: Negative for fever, chills and diaphoresis.  Respiratory: Negative for shortness of breath.   Cardiovascular: Negative for chest pain.  Gastrointestinal: Positive for nausea and abdominal pain. Negative for vomiting, diarrhea, constipation, blood in stool, melena, hematochezia, flatus and hematemesis.  Genitourinary: Negative for dysuria, hematuria, vaginal bleeding and vaginal discharge.  Musculoskeletal: Negative for myalgias and arthralgias.  Skin: Negative for color change.  Allergic/Immunologic: Negative for immunocompromised state.  Neurological: Positive for dizziness and light-headedness (only after vicodin earlier, not ongoing ). Negative for weakness and numbness.  Psychiatric/Behavioral: Negative for confusion.   10 Systems reviewed and are negative for acute change except as noted in the HPI.    Allergies  Clindamycin/lincomycin  Home Medications   Prior to Admission medications   Medication Sig Start Date End Date Taking? Authorizing Provider  ALREX 0.2 % SUSP Place 1 drop into the left eye 2 (two) times daily as needed (irritation).  12/20/14  Yes Historical Provider, MD  Cetirizine-Pseudoephedrine (ZYRTEC-D PO) Take 1 tablet by mouth every morning.    Yes Historical Provider, MD  Cholecalciferol (VITAMIN D-3) 1000 UNITS CAPS Take 2 capsules by  mouth 3 (three) times a week.    Yes Historical Provider, MD  Diclofenac Sodium 2 % SOLN Apply 1 pump twice daily. Patient taking differently: Apply 1 pump twice daily as needed. 12/06/14  Yes Lyndal Pulley, DO  diphenhydramine-acetaminophen (TYLENOL PM) 25-500 MG TABS Take 1 tablet by mouth at bedtime as needed (aches and sleep.).    Yes Historical Provider, MD  eszopiclone (LUNESTA) 2 MG TABS tablet Take 1 tablet (2 mg total) by mouth at bedtime as needed for sleep. Take immediately before bedtime Patient taking differently: Take 2 mg by mouth at bedtime as needed for sleep. Takes mainly on Friday and Saturday night. 09/10/13  Yes Terrance Mass, MD  fluticasone (FLONASE) 50 MCG/ACT nasal spray Place 2 sprays into both nostrils at bedtime.   Yes Historical Provider, MD  ibuprofen (ADVIL,MOTRIN) 200 MG tablet Take 600 mg by mouth every 6 (six) hours as needed for headache or moderate pain.   Yes Historical Provider, MD  magnesium oxide (MAG-OX) 400 MG tablet Take 400 mg by mouth 3 (  three) times a week. Mon,Wed,Fri   Yes Historical Provider, MD  meloxicam (MOBIC) 15 MG tablet Take 1 tablet (15 mg total) by mouth daily. 12/27/14  Yes Lyndal Pulley, DO  Multiple Vitamins-Minerals (CENTRUM VITAMINTS PO) Take 1 each by mouth daily.   Yes Historical Provider, MD  pantoprazole (PROTONIX) 40 MG tablet Take 1 tablet (40 mg total) by mouth daily. Patient taking differently: Take 40 mg by mouth 3 (three) times a week. Monday,Wednesdays, and Fridays 02/15/15  Yes Aleksei Plotnikov V, MD  Polyethylene Glycol 3350 (MIRALAX PO) Take 1 scoop by mouth at bedtime.    Yes Historical Provider, MD  rOPINIRole (REQUIP) 0.25 MG tablet Take 3 tablets (0.75 mg total) by mouth at bedtime. 02/15/15  Yes Aleksei Plotnikov V, MD   BP 147/78 mmHg  Pulse 63  Temp(Src) 98.7 F (37.1 C) (Oral)  Resp 18  SpO2 100% Physical Exam  Constitutional: She is oriented to person, place, and time. Vital signs are normal. She appears  well-developed and well-nourished.  Non-toxic appearance. No distress.  Afebrile, nontoxic, NAD  HENT:  Head: Normocephalic and atraumatic.  Mouth/Throat: Oropharynx is clear and moist and mucous membranes are normal.  Eyes: Conjunctivae and EOM are normal. Right eye exhibits no discharge. Left eye exhibits no discharge.  Neck: Normal range of motion. Neck supple.  Cardiovascular: Normal rate, regular rhythm, normal heart sounds and intact distal pulses.  Exam reveals no gallop and no friction rub.   No murmur heard. Pulmonary/Chest: Effort normal and breath sounds normal. No respiratory distress. She has no decreased breath sounds. She has no wheezes. She has no rhonchi. She has no rales.  Abdominal: Soft. Normal appearance and bowel sounds are normal. She exhibits no distension. There is tenderness in the epigastric area. There is no rigidity, no rebound, no guarding, no CVA tenderness, no tenderness at McBurney's point and negative Murphy's sign.  Soft, nondistended, +BS throughout, with epigastric TTP, no r/g/r, neg murphy's, neg mcburney's, no CVA TTP   Musculoskeletal: Normal range of motion.  Neurological: She is alert and oriented to person, place, and time. She has normal strength. No sensory deficit.  Skin: Skin is warm, dry and intact. No rash noted.  Psychiatric: She has a normal mood and affect.  Nursing note and vitals reviewed.   ED Course  Procedures (including critical care time) Labs Review Labs Reviewed  LIPASE, BLOOD - Abnormal; Notable for the following:    Lipase 1947 (*)    All other components within normal limits  COMPREHENSIVE METABOLIC PANEL - Abnormal; Notable for the following:    Glucose, Bld 135 (*)    AST 1204 (*)    ALT 1006 (*)    Alkaline Phosphatase 214 (*)    All other components within normal limits  URINALYSIS, ROUTINE W REFLEX MICROSCOPIC (NOT AT [facility]) - Abnormal; Notable for the following:    Specific Gravity, Urine 1.034 (*)    Hgb urine  dipstick TRACE (*)    Ketones, ur 15 (*)    All other components within normal limits  URINE MICROSCOPIC-ADD ON - Abnormal; Notable for the following:    Squamous Epithelial / LPF 0-5 (*)    Bacteria, UA FEW (*)    Casts GRANULAR CAST (*)    All other components within normal limits  URINE CULTURE  CBC  PROTIME-INR  I-STAT TROPOININ, ED    Imaging Review US Abdomen Complete  02/23/2015  CLINICAL DATA:  55 year old female with epigastric pain and elevated liver function  tests. History of cholecystectomy. EXAM: ABDOMEN ULTRASOUND COMPLETE COMPARISON:  CT dated 05/20/2013 and ultrasound dated 11/03/2013 bold under FINDINGS: Gallbladder: Cholecystectomy. Common bile duct: Diameter: 3 mm Liver: No focal lesion identified. Within normal limits in parenchymal echogenicity. IVC: No abnormality visualized. Pancreas: Visualized portion unremarkable. Spleen: Size and appearance within normal limits. Right Kidney: Length: 10 cm. Echogenicity within normal limits. No mass or hydronephrosis visualized. Left Kidney: Length: 9 cm.  The left kidney is poorly visualized. Abdominal aorta: No aneurysm visualized. Other findings: None. IMPRESSION: Unremarkable abdominal ultrasound. Status post prior cholecystectomy. Electronically Signed   By: Anner Crete M.D.   On: 02/23/2015 21:15   Ct Abdomen Pelvis W Contrast  02/23/2015  CLINICAL DATA:  55 year old female with epigastric pain and nausea. Elevated LFTs and lipase. EXAM: CT ABDOMEN AND PELVIS WITH CONTRAST TECHNIQUE: Multidetector CT imaging of the abdomen and pelvis was performed using the standard protocol following bolus administration of intravenous contrast. CONTRAST:  187m OMNIPAQUE IOHEXOL 300 MG/ML  SOLN COMPARISON:  Abdominal ultrasound dated 02/23/2015 and CT dated 05/20/2013 FINDINGS: The visualized lung bases are clear. No intra-abdominal free air. Trace free fluid within the pelvis. Cholecystectomy. Mild periportal edema. The liver otherwise  appears unremarkable. There is mild peripancreatic haziness and stranding compatible with acute pancreatitis. No drainable fluid collection/abscess or pseudocyst. The spleen, adrenal glands, kidneys, visualized ureters, and urinary bladder appear unremarkable. The uterus is grossly unremarkable. There is a 1.9 x 3.0 cm complex cystic and solid lesion in the region of the left adnexa as seen on the prior CT. There has been interval decrease in the size of the cystic components of this structure. This may represent a complex ovarian cyst or multiple adjacent cysts/follicles. Evaluation is however limited on CT. Other etiologies including endometrioma or cystic neoplasm are not excluded. Ultrasound is recommended for further characterization. This postsurgical changes of gastric bypass. No evidence of bowel obstruction or inflammation. Constipation. Normal appendix. The abdominal aorta and IVC appear patent. No portal venous gas identified. There is no adenopathy. There is a small fat containing umbilical hernia. Mild degenerative changes of the spine. No acute fracture. IMPRESSION: Acute pancreatitis.  No abscess. Constipation. No bowel obstruction or inflammation. Normal appendix. Electronically Signed   By: AAnner CreteM.D.   On: 02/23/2015 23:17   I have personally reviewed and evaluated these images and lab results as part of my medical decision-making.   EKG Interpretation   Date/Time:  Thursday February 23 2015 23:41:41 EST Ventricular Rate:  55 PR Interval:  159 QRS Duration: 111 QT Interval:  424 QTC Calculation: 405 R Axis:   31 Text Interpretation:  Sinus rhythm Nonspecific T abnormalities, lateral  leads Nonspecific T wave abnormality Since last tracing rate slower  Nonspecific T wave abnormality now found. Confirmed by MILLER  MD, BLahaina ([phone] on 02/24/2015 12:07:53 AM      MDM   Final diagnoses:  Epigastric abdominal pain  Nausea  Acute pancreatitis, unspecified pancreatitis  type  Elevated LFTs  Transaminitis    55y.o. female here with epigastric pain and nausea sudden onset last night at midnight. Had gastric sleeve last year, has not had any issues since then. No EtOH use, no sick contacts, no travel. Took vicodin which helped but made her dizzy. On exam, +epigastric tenderness, nonperitoneal. No other tenderness elsewhere. On labs, CBC WNL, U/A with 0-5 squamous, 6-30 WBC but few bacteria, will send for culture. Lipase 1947. CMP showing AST 1204, ALT 1006, Alk phos 214 which is  concerning for ?hepatitis vs gallstone pancreatitis but pt without gallbladder. Will get INR to eval liver function, and EKG/trop given epigastric pain. Zofran given with good results. Pt declines wanting pain meds now, states her pain is 2/10 at present. Will give fluids. Will reassess shortly.   9:35 PM Trop neg. INR 1.05 WNL. EKG not yet done. U/S completely unremarkable. Will proceed with CT imaging to better visualize the pancreas and biliary tree, since U/S did not provide sufficient eval. Will reassess shortly. Pt denies any concerns at this time, denies wanting pain meds and nausea still resolved.   11:37 PM CT showing acute pancreatitis without abscess. Will consult GI to discuss LFT abnormalities in this setting, and decide on potential admission vs outpt management. Still haven't gotten EKG, asked tech to get this done now.  12:16 AM Still haven't heard back from GI, will page again. Pt comfortable at this time as long as she's not eating/drinking, but symptoms worsen with PO intact, as expected. Pt would likely benefit from admission overnight for bowel rest and to trend LFTs. Will await to speak to GI, then proceed with admission.   12:30 AM Still haven't heard back from GI. Will proceed with consult to admit now, in order to avoid delaying care.   12:44 AM Dr. Hal Hope returning page, will admit to med-surg. Holding orders placed. Please note that GI has not yet called  back, despite being paged twice. They will likely need to be consulted during her admission for further discussion of her transaminitis if it persists  BP 125/71 mmHg  Pulse 61  Temp(Src) 98.3 F (36.8 C) (Oral)  Resp 17  SpO2 100%  Meds ordered this encounter  Medications  . ondansetron (ZOFRAN-ODT) disintegrating tablet 4 mg    Sig:   . sodium chloride 0.9 % bolus 1,000 mL    Sig:   . iohexol (OMNIPAQUE) 300 MG/ML solution 100 mL    Sig:   . 0.9 %  sodium chloride infusion    Sig:      Jamell Laymon Camprubi-Soms, PA-C 02/24/15 0051  Noemi Chapel, MD 02/24/15 2330

## 2015-02-23 NOTE — ED Notes (Signed)
Gastric sleeve and hiatal hernia surgery in March of last year. Woke up this morning and began having upper abdominal pain, took some of her liquid pain medication without relief. Called her surgeon's office who scheduled her for a CT scan tomorrow, stated she couldn't wait that long. Also complaining of dizziness and nausea. Vitals stable in triage.

## 2015-02-23 NOTE — ED Provider Notes (Signed)
The patient is a 55 year old female, she has a history of gastric sleeve procedure performed about a year ago, she also has a history of cholecystitis and removal back in the 1990s, she has had pain since last night at midnight, on exam the patient has minimal tenderness in the supraumbilical area, there is no guarding, no masses, no peritoneal signs, no jaundice, no tachycardia. Labs are very abnormal with elevated lipase and liver function, she will need further imaging and possible admission.  Medical screening examination/treatment/procedure(s) were conducted as a shared visit with non-physician practitioner(s) and myself.  I personally evaluated the patient during the encounter.  Clinical Impression:   Final diagnoses:  Epigastric abdominal pain  Nausea  Acute pancreatitis, unspecified pancreatitis type  Elevated LFTs  Transaminitis         Noemi Chapel, MD 02/24/15 2330

## 2015-02-24 ENCOUNTER — Inpatient Hospital Stay: Admission: RE | Admit: 2015-02-24 | Payer: 59 | Source: Ambulatory Visit

## 2015-02-24 ENCOUNTER — Encounter (HOSPITAL_COMMUNITY): Payer: Self-pay | Admitting: Internal Medicine

## 2015-02-24 DIAGNOSIS — R740 Nonspecific elevation of levels of transaminase and lactic acid dehydrogenase [LDH]: Secondary | ICD-10-CM

## 2015-02-24 DIAGNOSIS — K859 Acute pancreatitis without necrosis or infection, unspecified: Principal | ICD-10-CM | POA: Diagnosis present

## 2015-02-24 DIAGNOSIS — R7989 Other specified abnormal findings of blood chemistry: Secondary | ICD-10-CM

## 2015-02-24 DIAGNOSIS — K858 Other acute pancreatitis without necrosis or infection: Secondary | ICD-10-CM

## 2015-02-24 DIAGNOSIS — R945 Abnormal results of liver function studies: Secondary | ICD-10-CM

## 2015-02-24 DIAGNOSIS — R7401 Elevation of levels of liver transaminase levels: Secondary | ICD-10-CM | POA: Insufficient documentation

## 2015-02-24 DIAGNOSIS — Z6832 Body mass index (BMI) 32.0-32.9, adult: Secondary | ICD-10-CM | POA: Diagnosis not present

## 2015-02-24 DIAGNOSIS — R1013 Epigastric pain: Secondary | ICD-10-CM | POA: Diagnosis not present

## 2015-02-24 DIAGNOSIS — Z9884 Bariatric surgery status: Secondary | ICD-10-CM | POA: Diagnosis not present

## 2015-02-24 DIAGNOSIS — K8500 Idiopathic acute pancreatitis without necrosis or infection: Secondary | ICD-10-CM | POA: Diagnosis not present

## 2015-02-24 DIAGNOSIS — E669 Obesity, unspecified: Secondary | ICD-10-CM | POA: Diagnosis present

## 2015-02-24 DIAGNOSIS — K219 Gastro-esophageal reflux disease without esophagitis: Secondary | ICD-10-CM | POA: Diagnosis present

## 2015-02-24 DIAGNOSIS — Z9049 Acquired absence of other specified parts of digestive tract: Secondary | ICD-10-CM | POA: Diagnosis not present

## 2015-02-24 DIAGNOSIS — Z79899 Other long term (current) drug therapy: Secondary | ICD-10-CM | POA: Diagnosis not present

## 2015-02-24 LAB — LIPID PANEL
CHOL/HDL RATIO: 2.2 ratio
Cholesterol: 156 mg/dL (ref 0–200)
HDL: 70 mg/dL (ref >40)
LDL Cholesterol: 72 mg/dL (ref 0–99)
Triglycerides: 68 mg/dL (ref <150)
VLDL: 14 mg/dL (ref 0–40)

## 2015-02-24 LAB — GLUCOSE, CAPILLARY
GLUCOSE-CAPILLARY: 65 mg/dL (ref 65–99)
GLUCOSE-CAPILLARY: 67 mg/dL (ref 65–99)
Glucose-Capillary: 82 mg/dL (ref 65–99)

## 2015-02-24 LAB — ACETAMINOPHEN LEVEL

## 2015-02-24 LAB — COMPREHENSIVE METABOLIC PANEL
ALK PHOS: 169 U/L — AB (ref 38–126)
ALT: 633 U/L — AB (ref 14–54)
ANION GAP: 7 (ref 5–15)
AST: 579 U/L — ABNORMAL HIGH (ref 15–41)
Albumin: 3.6 g/dL (ref 3.5–5.0)
BUN: 15 mg/dL (ref 6–20)
CALCIUM: 8.9 mg/dL (ref 8.9–10.3)
CHLORIDE: 109 mmol/L (ref 101–111)
CO2: 26 mmol/L (ref 22–32)
CREATININE: 0.6 mg/dL (ref 0.44–1.00)
Glucose, Bld: 93 mg/dL (ref 65–99)
Potassium: 3.5 mmol/L (ref 3.5–5.1)
Sodium: 142 mmol/L (ref 135–145)
Total Bilirubin: 0.5 mg/dL (ref 0.3–1.2)
Total Protein: 6.1 g/dL — ABNORMAL LOW (ref 6.5–8.1)

## 2015-02-24 LAB — CBC WITH DIFFERENTIAL/PLATELET
BASOS ABS: 0 10*3/uL (ref 0.0–0.1)
Basophils Relative: 0 %
Eosinophils Absolute: 0.1 10*3/uL (ref 0.0–0.7)
Eosinophils Relative: 1 %
HCT: 34.1 % — ABNORMAL LOW (ref 36.0–46.0)
HEMOGLOBIN: 11 g/dL — AB (ref 12.0–15.0)
Lymphocytes Relative: 32 %
Lymphs Abs: 2 10*3/uL (ref 0.7–4.0)
MCH: 28.5 pg (ref 26.0–34.0)
MCHC: 32.3 g/dL (ref 30.0–36.0)
MCV: 88.3 fL (ref 78.0–100.0)
Monocytes Absolute: 0.4 10*3/uL (ref 0.1–1.0)
Monocytes Relative: 7 %
NEUTROS ABS: 3.7 10*3/uL (ref 1.7–7.7)
NEUTROS PCT: 60 %
PLATELETS: 139 10*3/uL — AB (ref 150–400)
RBC: 3.86 MIL/uL — AB (ref 3.87–5.11)
RDW: 14.2 % (ref 11.5–15.5)
WBC: 6.2 10*3/uL (ref 4.0–10.5)

## 2015-02-24 LAB — PROTIME-INR
INR: 1.12 (ref 0.00–1.49)
PROTHROMBIN TIME: 14.6 s (ref 11.6–15.2)

## 2015-02-24 MED ORDER — SODIUM CHLORIDE 0.9 % IV SOLN
INTRAVENOUS | Status: DC
  Administered 2015-02-24: 01:00:00 via INTRAVENOUS

## 2015-02-24 MED ORDER — ONDANSETRON HCL 4 MG/2ML IJ SOLN: 4.0000 mg | Freq: Four times a day (QID) | INTRAMUSCULAR | Status: DC | PRN

## 2015-02-24 MED ORDER — ACETAMINOPHEN 650 MG RE SUPP: 650.0000 mg | Freq: Four times a day (QID) | RECTAL | Status: DC | PRN

## 2015-02-24 MED ORDER — VITAMIN D 1000 UNITS PO TABS
2000.0000 [IU] | ORAL_TABLET | ORAL | Status: DC
  Administered 2015-02-24: 2000 [IU] via ORAL
  Filled 2015-02-24: qty 2

## 2015-02-24 MED ORDER — ACETAMINOPHEN 325 MG PO TABS: 650.0000 mg | ORAL_TABLET | Freq: Four times a day (QID) | ORAL | Status: DC | PRN

## 2015-02-24 MED ORDER — ZOLPIDEM TARTRATE 5 MG PO TABS: 5.0000 mg | ORAL_TABLET | Freq: Every evening | ORAL | Status: DC | PRN

## 2015-02-24 MED ORDER — ROPINIROLE HCL 0.5 MG PO TABS
0.7500 mg | ORAL_TABLET | Freq: Every day | ORAL | Status: DC
  Filled 2015-02-24 (×3): qty 1

## 2015-02-24 MED ORDER — PANTOPRAZOLE SODIUM 40 MG PO TBEC
40.0000 mg | DELAYED_RELEASE_TABLET | ORAL | Status: DC
  Administered 2015-02-24: 40 mg via ORAL
  Filled 2015-02-24: qty 1

## 2015-02-24 MED ORDER — MAGNESIUM OXIDE 400 (241.3 MG) MG PO TABS
400.0000 mg | ORAL_TABLET | ORAL | Status: DC
  Administered 2015-02-24: 400 mg via ORAL
  Filled 2015-02-24: qty 1

## 2015-02-24 MED ORDER — ONDANSETRON HCL 4 MG PO TABS: 4.0000 mg | ORAL_TABLET | Freq: Four times a day (QID) | ORAL | Status: DC | PRN

## 2015-02-24 MED ORDER — MORPHINE SULFATE (PF) 2 MG/ML IV SOLN: 1.0000 mg | INTRAVENOUS | Status: DC | PRN

## 2015-02-24 MED ORDER — FLUTICASONE PROPIONATE 50 MCG/ACT NA SUSP
2.0000 | Freq: Every day | NASAL | Status: DC
  Filled 2015-02-24: qty 16

## 2015-02-24 MED ORDER — ENOXAPARIN SODIUM 40 MG/0.4ML ~~LOC~~ SOLN: 40.0000 mg | SUBCUTANEOUS | Status: DC

## 2015-02-24 MED ORDER — FLUTICASONE PROPIONATE 50 MCG/ACT NA SUSP: 2.0000 | Freq: Every day | NASAL | Status: DC

## 2015-02-24 MED ORDER — SODIUM CHLORIDE 0.9 % IV SOLN
INTRAVENOUS | Status: AC
  Administered 2015-02-24: 03:00:00 via INTRAVENOUS
  Administered 2015-02-24: 1000 mL via INTRAVENOUS
  Administered 2015-02-24: 15:00:00 via INTRAVENOUS

## 2015-02-24 NOTE — H&P (Signed)
[facility] History and Physical  ADDILYNN CRAMER [id number] DOB: Apr 15, 1960 DOA: 02/23/2015  Referring physician: Ms. Dewitt Hoes. PA. PCP: Rooke Kehr, MD  Specialists: None.  Chief Complaint: Abdominal pain with nausea.  HPI: Suzanne Schaefer is a 55 y.o. female history of GERD and history of S/P laparoscopic sleeve gastrectomy last year in March presents to the ER because of abdominal pain with nausea. Patient's symptoms started 2 nights ago with pain around the epigastric area. Nonradiating. Denies any vomiting or diarrhea. Denies any fever or chills. Since pain is persistent came to the ER. Patient's labs revealed markedly elevated LFTs and lipase. CT scan shows acute pancreatitis. There is no definite stones. Patient does not drink alcohol. Patient at this time states her pain is largely resolved. Patient will be admitted for further management of acute peritonitis and markedly elevated LFTs. Denies any recent medication changes or sick contacts.  Review of Systems: As presented in the history of presenting illness, rest negative.  Past Medical History  Diagnosis Date  . Restless leg syndrome   . Tonsillitis     nothing recent  . Esophagitis   . Allergic rhinitis     uses OTC meds  . History of sinusitis     no current problems  . Obesity   . Obstructive sleep apnea     no cpap currently.  Marland Kitchen GERD (gastroesophageal reflux disease)   . Arthritis     arthritis -knees ,ankles  . Complication of anesthesia   . PONV (postoperative nausea and vomiting)    Past Surgical History  Procedure Laterality Date  . Cervical biopsy    . Laparoscopic cholecystectomy  1992  . Bilat salpingectomy    . Cesarean section    . Tubal ligation  1988 1992  . Laparoscopic gastric sleeve resection N/A 04/05/2014    Procedure: LAPAROSCOPIC GASTRIC SLEEVE RESECTION with hiatel hernia repair;  Surgeon: Pedro Earls, MD;  Location: [facility];  Service: General;  Laterality: N/A;  . Upper gi  endoscopy  04/05/2014    Procedure: UPPER GI ENDOSCOPY;  Surgeon: Pedro Earls, MD;  Location: [facility];  Service: General;;   Social History:  reports that she has never smoked. She has never used smokeless tobacco. She reports that she drinks alcohol. She reports that she does not use illicit drugs. Where does patient live at home. Can patient participate in ADLs? Yes.  Allergies  Allergen Reactions  . Clindamycin/Lincomycin Rash    Family History:  Family History  Problem Relation Age of Onset  . Asthma Mother   . COPD Mother   . Cancer Mother 56    PERITONEAL   . Hypertension Father   . Obesity Father   . Glaucoma Father   . Cancer Maternal Uncle     >20 colon polyps  . Cancer Paternal Uncle     blood cancer  . Cancer Paternal Grandfather     lymphoma  . Atrial fibrillation Father   . Allergies Mother     ? seasonal      Prior to Admission medications   Medication Sig Start Date End Date Taking? Authorizing Provider  ALREX 0.2 % SUSP Place 1 drop into the left eye 2 (two) times daily as needed (irritation).  12/20/14  Yes Historical Provider, MD  Cetirizine-Pseudoephedrine (ZYRTEC-D PO) Take 1 tablet by mouth every morning.    Yes Historical Provider, MD  Cholecalciferol (VITAMIN D-3) 1000 UNITS CAPS Take 2 capsules by mouth 3 (three) times a week.  Yes Historical Provider, MD  Diclofenac Sodium 2 % SOLN Apply 1 pump twice daily. Patient taking differently: Apply 1 pump twice daily as needed. 12/06/14  Yes Lyndal Pulley, DO  diphenhydramine-acetaminophen (TYLENOL PM) 25-500 MG TABS Take 1 tablet by mouth at bedtime as needed (aches and sleep.).    Yes Historical Provider, MD  eszopiclone (LUNESTA) 2 MG TABS tablet Take 1 tablet (2 mg total) by mouth at bedtime as needed for sleep. Take immediately before bedtime Patient taking differently: Take 2 mg by mouth at bedtime as needed for sleep. Takes mainly on Friday and Saturday night. 09/10/13  Yes Terrance Mass, MD   fluticasone (FLONASE) 50 MCG/ACT nasal spray Place 2 sprays into both nostrils at bedtime.   Yes Historical Provider, MD  ibuprofen (ADVIL,MOTRIN) 200 MG tablet Take 600 mg by mouth every 6 (six) hours as needed for headache or moderate pain.   Yes Historical Provider, MD  magnesium oxide (MAG-OX) 400 MG tablet Take 400 mg by mouth 3 (three) times a week. Mon,Wed,Fri   Yes Historical Provider, MD  meloxicam (MOBIC) 15 MG tablet Take 1 tablet (15 mg total) by mouth daily. 12/27/14  Yes Lyndal Pulley, DO  Multiple Vitamins-Minerals (CENTRUM VITAMINTS PO) Take 1 each by mouth daily.   Yes Historical Provider, MD  pantoprazole (PROTONIX) 40 MG tablet Take 1 tablet (40 mg total) by mouth daily. Patient taking differently: Take 40 mg by mouth 3 (three) times a week. Monday,Wednesdays, and Fridays 02/15/15  Yes Aleksei Plotnikov V, MD  Polyethylene Glycol 3350 (MIRALAX PO) Take 1 scoop by mouth at bedtime.    Yes Historical Provider, MD  rOPINIRole (REQUIP) 0.25 MG tablet Take 3 tablets (0.75 mg total) by mouth at bedtime. 02/15/15  Yes Cassandria Anger, MD    Physical Exam: Filed Vitals:   02/24/15 0100 02/24/15 0104 02/24/15 0145 02/24/15 0209  BP:  127/71  141/62  Pulse: 65  57 57  Temp:    98.1 F (36.7 C)  TempSrc:    Oral  Resp: 18  17 18   Height:    5\' 2"  (1.575 m)  Weight:    79.652 kg (175 lb 9.6 oz)  SpO2: 100%  99% 100%     General:  Moderately built and nourished.  Eyes: Anicteric no pallor.  ENT: No discharge from the ears eyes nose or mouth.  Neck: No mass felt.  Cardiovascular: S1 and S2 heard.  Respiratory: No rhonchi or crepitations.  Abdomen: Soft nontender bowel sounds present. No guarding or rigidity.  Skin: No rash.  Musculoskeletal: No edema.  Psychiatric: Appears normal.  Neurologic: Alert awake oriented to time place and person. Moves all extremities.  Labs on Admission:  Basic Metabolic Panel:  Recent Labs Lab 02/23/15 1539  NA 139  K 4.2   CL 104  CO2 27  GLUCOSE 135*  BUN 20  CREATININE 0.49  CALCIUM 9.5   Liver Function Tests:  Recent Labs Lab 02/23/15 1539  AST 1204*  ALT 1006*  ALKPHOS 214*  BILITOT 0.8  PROT 7.7  ALBUMIN 4.4    Recent Labs Lab 02/23/15 1539  LIPASE 1947*   No results for input(s): AMMONIA in the last 168 hours. CBC:  Recent Labs Lab 02/23/15 1539  WBC 8.0  HGB 12.7  HCT 38.4  MCV 85.9  PLT 185   Cardiac Enzymes: No results for input(s): CKTOTAL, CKMB, CKMBINDEX, TROPONINI in the last 168 hours.  BNP (last 3 results) No results for input(s):  BNP in the last 8760 hours.  ProBNP (last 3 results) No results for input(s): PROBNP in the last 8760 hours.  CBG: No results for input(s): GLUCAP in the last 168 hours.  Radiological Exams on Admission: US Abdomen Complete  02/23/2015  CLINICAL DATA:  55 year old female with epigastric pain and elevated liver function tests. History of cholecystectomy. EXAM: ABDOMEN ULTRASOUND COMPLETE COMPARISON:  CT dated 05/20/2013 and ultrasound dated 11/03/2013 bold under FINDINGS: Gallbladder: Cholecystectomy. Common bile duct: Diameter: 3 mm Liver: No focal lesion identified. Within normal limits in parenchymal echogenicity. IVC: No abnormality visualized. Pancreas: Visualized portion unremarkable. Spleen: Size and appearance within normal limits. Right Kidney: Length: 10 cm. Echogenicity within normal limits. No mass or hydronephrosis visualized. Left Kidney: Length: 9 cm.  The left kidney is poorly visualized. Abdominal aorta: No aneurysm visualized. Other findings: None. IMPRESSION: Unremarkable abdominal ultrasound. Status post prior cholecystectomy. Electronically Signed   By: Anner Crete M.D.   On: 02/23/2015 21:15   Ct Abdomen Pelvis W Contrast  02/23/2015  CLINICAL DATA:  55 year old female with epigastric pain and nausea. Elevated LFTs and lipase. EXAM: CT ABDOMEN AND PELVIS WITH CONTRAST TECHNIQUE: Multidetector CT imaging of the  abdomen and pelvis was performed using the standard protocol following bolus administration of intravenous contrast. CONTRAST:  164mL OMNIPAQUE IOHEXOL 300 MG/ML  SOLN COMPARISON:  Abdominal ultrasound dated 02/23/2015 and CT dated 05/20/2013 FINDINGS: The visualized lung bases are clear. No intra-abdominal free air. Trace free fluid within the pelvis. Cholecystectomy. Mild periportal edema. The liver otherwise appears unremarkable. There is mild peripancreatic haziness and stranding compatible with acute pancreatitis. No drainable fluid collection/abscess or pseudocyst. The spleen, adrenal glands, kidneys, visualized ureters, and urinary bladder appear unremarkable. The uterus is grossly unremarkable. There is a 1.9 x 3.0 cm complex cystic and solid lesion in the region of the left adnexa as seen on the prior CT. There has been interval decrease in the size of the cystic components of this structure. This may represent a complex ovarian cyst or multiple adjacent cysts/follicles. Evaluation is however limited on CT. Other etiologies including endometrioma or cystic neoplasm are not excluded. Ultrasound is recommended for further characterization. This postsurgical changes of gastric bypass. No evidence of bowel obstruction or inflammation. Constipation. Normal appendix. The abdominal aorta and IVC appear patent. No portal venous gas identified. There is no adenopathy. There is a small fat containing umbilical hernia. Mild degenerative changes of the spine. No acute fracture. IMPRESSION: Acute pancreatitis.  No abscess. Constipation. No bowel obstruction or inflammation. Normal appendix. Electronically Signed   By: Anner Crete M.D.   On: 02/23/2015 23:17     Assessment/Plan Principal Problem:   Pancreatitis, acute Active Problems:   Acute pancreatitis   1. Acute pancreatitis - cause not clear. Patient has markedly elevated LFTs with bilirubin are normal. Patient is afebrile. Denies drinking alcohol.  Has had prior history of cholecystectomy. At this time will check triglyceride levels. Recheck lipase and LFTs. Continue with hydration I have place patient nothing by mouth for now. Probably may need GI input in a.m. 2. Markedly elevated LFTs - bilirubin is normal. Cause not clear. Will check acute hepatitis panel and Tylenol level and follow INR and LFTs closely. 3. History of sleeve gastrectomy for weight loss last year. 4. History of GERD.   DVT Prophylaxis SCDs.  Code Status: Full code.  Family Communication: Discussed with patient.  Disposition Plan: Admit to inpatient.    Jourdin Gens N. [facility] Pager [phone].  If 7PM-7AM, please  contact night-coverage [email or web address] Password [email or web address] 02/24/2015, 2:21 AM

## 2015-02-24 NOTE — Consult Note (Signed)
[facility] GI Consultation  Suzanne Schaefer VZD:[id number] DOB: 03/18/1960 DOA: 02/23/2015 PCP: Basford Kehr, MD   Requesting physician: [facility]  Date of consultation: 02/24/2015 Reason for consultation: acute pancreatitis Gastroenterologist: Was Deatra Ina Chief Complaint: acute pancreatitis   HPI:  The patient is a 55 yo female with a PMH of GERD and s/p gastric sleeve 3/16 who presented to the ED with severe epigastric pain that began late Wednesday night (1/18). The pain did not radiate, but did stay constant. She states she was woken up from sleep and it felt similar to the pain after her gastric sleeve procedure. She took leftover pain medication from her procedure which alleviated the pain; however, lying on her side made the pain worse. She had nausea when she was drinking water and vomited x1. She states she felt lightheaded upon awakening in the morning. The patient called Dr. Hassell Done on Thursday for guidance and was told to have a CT on 1/20, but went to the ER instead. Her last appt with Dr. Hassell Done in November was normal. In the ER, her lipase was 1947, alk phos 214, AST 1204, ALT 1006. She denies any use of alcohol, tylenol, change in medications, or recent ABX. She does take calcium supplements, but her Ca+ level was normal. She has had a remote cholecystectomy (1992).   She states she has not had any pain since yesterday afternoon, but just wants to know why she had that severe pain.   Review of Systems:  Review of Systems  Constitutional: Positive for weight loss (s/p gastric sleeve). Negative for fever.  Cardiovascular: Negative for chest pain.  Gastrointestinal: Positive for nausea, vomiting and abdominal pain (epigastric). Negative for heartburn, diarrhea and constipation.  Musculoskeletal: Positive for back pain.  Neurological:       Lightheadedness   All other systems reviewed and are negative.   Past Medical History  Diagnosis Date  . Restless leg syndrome   . Tonsillitis      nothing recent  . Esophagitis   . Allergic rhinitis     uses OTC meds  . History of sinusitis     no current problems  . Obesity   . Obstructive sleep apnea     no cpap currently.  Marland Kitchen GERD (gastroesophageal reflux disease)   . Arthritis     arthritis -knees ,ankles  . Complication of anesthesia   . PONV (postoperative nausea and vomiting)    Past Surgical History  Procedure Laterality Date  . Cervical biopsy    . Laparoscopic cholecystectomy  1992  . Bilat salpingectomy    . Cesarean section    . Tubal ligation  1988 1992  . Laparoscopic gastric sleeve resection N/A 04/05/2014    Procedure: LAPAROSCOPIC GASTRIC SLEEVE RESECTION with hiatel hernia repair;  Surgeon: Pedro Earls, MD;  Location: [facility];  Service: General;  Laterality: N/A;  . Upper gi endoscopy  04/05/2014    Procedure: UPPER GI ENDOSCOPY;  Surgeon: Pedro Earls, MD;  Location: [facility];  Service: General;;   Social History:  reports that she has never smoked. She has never used smokeless tobacco. She reports that she drinks alcohol. She reports that she does not use illicit drugs.  Allergies  Allergen Reactions  . Clindamycin/Lincomycin Rash   Family History  Problem Relation Age of Onset  . Asthma Mother   . COPD Mother   . Cancer Mother 60    PERITONEAL   . Hypertension Father   . Obesity Father   . Glaucoma  Father   . Cancer Maternal Uncle     >20 colon polyps  . Cancer Paternal Uncle     blood cancer  . Cancer Paternal Grandfather     lymphoma  . Atrial fibrillation Father   . Allergies Mother     ? seasonal    Prior to Admission medications   Medication Sig Start Date End Date Taking? Authorizing Provider  ALREX 0.2 % SUSP Place 1 drop into the left eye 2 (two) times daily as needed (irritation).  12/20/14  Yes Historical Provider, MD  Cetirizine-Pseudoephedrine (ZYRTEC-D PO) Take 1 tablet by mouth every morning.    Yes Historical Provider, MD  Cholecalciferol (VITAMIN D-3) 1000 UNITS CAPS  Take 2 capsules by mouth 3 (three) times a week.    Yes Historical Provider, MD  Diclofenac Sodium 2 % SOLN Apply 1 pump twice daily. Patient taking differently: Apply 1 pump twice daily as needed. 12/06/14  Yes Lyndal Pulley, DO  diphenhydramine-acetaminophen (TYLENOL PM) 25-500 MG TABS Take 1 tablet by mouth at bedtime as needed (aches and sleep.).    Yes Historical Provider, MD  eszopiclone (LUNESTA) 2 MG TABS tablet Take 1 tablet (2 mg total) by mouth at bedtime as needed for sleep. Take immediately before bedtime Patient taking differently: Take 2 mg by mouth at bedtime as needed for sleep. Takes mainly on Friday and Saturday night. 09/10/13  Yes Terrance Mass, MD  fluticasone (FLONASE) 50 MCG/ACT nasal spray Place 2 sprays into both nostrils at bedtime.   Yes Historical Provider, MD  ibuprofen (ADVIL,MOTRIN) 200 MG tablet Take 600 mg by mouth every 6 (six) hours as needed for headache or moderate pain.   Yes Historical Provider, MD  magnesium oxide (MAG-OX) 400 MG tablet Take 400 mg by mouth 3 (three) times a week. Mon,Wed,Fri   Yes Historical Provider, MD  meloxicam (MOBIC) 15 MG tablet Take 1 tablet (15 mg total) by mouth daily. 12/27/14  Yes Lyndal Pulley, DO  Multiple Vitamins-Minerals (CENTRUM VITAMINTS PO) Take 1 each by mouth daily.   Yes Historical Provider, MD  pantoprazole (PROTONIX) 40 MG tablet Take 1 tablet (40 mg total) by mouth daily. Patient taking differently: Take 40 mg by mouth 3 (three) times a week. Monday,Wednesdays, and Fridays 02/15/15  Yes Aleksei Plotnikov V, MD  Polyethylene Glycol 3350 (MIRALAX PO) Take 1 scoop by mouth at bedtime.    Yes Historical Provider, MD  rOPINIRole (REQUIP) 0.25 MG tablet Take 3 tablets (0.75 mg total) by mouth at bedtime. 02/15/15  Yes Cassandria Anger, MD   Physical Exam: Blood pressure 106/56, pulse 62, temperature 98.4 F (36.9 C), temperature source Oral, resp. rate 18, height '5\' 2"'$  (1.575 m), weight 175 lb 9.6 oz (79.652 kg),  SpO2 96 %. Filed Vitals:   02/24/15 0209 02/24/15 0643  BP: 141/62 106/56  Pulse: 57 62  Temp: 98.1 F (36.7 C) 98.4 F (36.9 C)  Resp: 18 18   Physical Exam  Constitutional: She is oriented to person, place, and time and well-developed, well-nourished, and in no distress. No distress.  HENT:  Head: Normocephalic and atraumatic.  Eyes: No scleral icterus.  Neck: No tracheal deviation present.  Cardiovascular: Normal rate, regular rhythm and normal heart sounds.  Exam reveals no gallop and no friction rub.   No murmur heard. Pulmonary/Chest: Effort normal and breath sounds normal. She has no wheezes. She has no rales.  Abdominal: Soft. Bowel sounds are normal. She exhibits no distension and no mass. There  is no tenderness. There is no rebound and no guarding.  Small scars from gastric sleeve surgery   Neurological: She is alert and oriented to person, place, and time.  Skin: Skin is warm and dry.  Psychiatric: Mood, memory, affect and judgment normal.    Labs on Admission:  Basic Metabolic Panel:  Recent Labs Lab 02/23/15 1539 02/24/15 0358  NA 139 142  K 4.2 3.5  CL 104 109  CO2 27 26  GLUCOSE 135* 93  BUN 20 15  CREATININE 0.49 0.60  CALCIUM 9.5 8.9   Liver Function Tests:  Recent Labs Lab 02/23/15 1539 02/24/15 0358  AST 1204* 579*  ALT 1006* 633*  ALKPHOS 214* 169*  BILITOT 0.8 0.5  PROT 7.7 6.1*  ALBUMIN 4.4 3.6    Recent Labs Lab 02/23/15 1539  LIPASE 1947*   CBC:  Recent Labs Lab 02/23/15 1539 02/24/15 0358  WBC 8.0 6.2  NEUTROABS  --  3.7  HGB 12.7 11.0*  HCT 38.4 34.1*  MCV 85.9 88.3  PLT 185 139*    Radiological Exams on Admission: US Abdomen Complete  02/23/2015  CLINICAL DATA:  55 year old female with epigastric pain and elevated liver function tests. History of cholecystectomy. EXAM: ABDOMEN ULTRASOUND COMPLETE COMPARISON:  CT dated 05/20/2013 and ultrasound dated 11/03/2013 bold under FINDINGS: Gallbladder: Cholecystectomy.  Common bile duct: Diameter: 3 mm Liver: No focal lesion identified. Within normal limits in parenchymal echogenicity. IVC: No abnormality visualized. Pancreas: Visualized portion unremarkable. Spleen: Size and appearance within normal limits. Right Kidney: Length: 10 cm. Echogenicity within normal limits. No mass or hydronephrosis visualized. Left Kidney: Length: 9 cm.  The left kidney is poorly visualized. Abdominal aorta: No aneurysm visualized. Other findings: None. IMPRESSION: Unremarkable abdominal ultrasound. Status post prior cholecystectomy. Electronically Signed   By: Anner Crete M.D.   On: 02/23/2015 21:15   Ct Abdomen Pelvis W Contrast  02/23/2015  CLINICAL DATA:  55 year old female with epigastric pain and nausea. Elevated LFTs and lipase. EXAM: CT ABDOMEN AND PELVIS WITH CONTRAST TECHNIQUE: Multidetector CT imaging of the abdomen and pelvis was performed using the standard protocol following bolus administration of intravenous contrast. CONTRAST:  159m OMNIPAQUE IOHEXOL 300 MG/ML  SOLN COMPARISON:  Abdominal ultrasound dated 02/23/2015 and CT dated 05/20/2013 FINDINGS: The visualized lung bases are clear. No intra-abdominal free air. Trace free fluid within the pelvis. Cholecystectomy. Mild periportal edema. The liver otherwise appears unremarkable. There is mild peripancreatic haziness and stranding compatible with acute pancreatitis. No drainable fluid collection/abscess or pseudocyst. The spleen, adrenal glands, kidneys, visualized ureters, and urinary bladder appear unremarkable. The uterus is grossly unremarkable. There is a 1.9 x 3.0 cm complex cystic and solid lesion in the region of the left adnexa as seen on the prior CT. There has been interval decrease in the size of the cystic components of this structure. This may represent a complex ovarian cyst or multiple adjacent cysts/follicles. Evaluation is however limited on CT. Other etiologies including endometrioma or cystic neoplasm are  not excluded. Ultrasound is recommended for further characterization. This postsurgical changes of gastric bypass. No evidence of bowel obstruction or inflammation. Constipation. Normal appendix. The abdominal aorta and IVC appear patent. No portal venous gas identified. There is no adenopathy. There is a small fat containing umbilical hernia. Mild degenerative changes of the spine. No acute fracture. IMPRESSION: Acute pancreatitis.  No abscess. Constipation. No bowel obstruction or inflammation. Normal appendix. Electronically Signed   By: AAnner CreteM.D.   On: 02/23/2015 23:17  Assessment/Plan  1. Acute pancreatitis  Presented with elevated lipase and LFTs, [place] neg, CT showed pancreatitis  Hospitalist has changed diet to clear liquid - will see how clears work, then full liquids  and if tolerated we can advance to low fat diet  tomorrow morning Lipid panel pending, possible hypertriglyceridemia playing a factor - lipid panel from July looks good IVF replacement Pain management  May need MRCP - think would make sense to do this once she recovers - about a month from now. This would allow better evaluation of pancreas once inflammation gone. Could have passed a CBD stone even though s/p cholecystectomy. LFTs are trending down  Ketones in the urine  Repeat lipase in AM  May be able to go home tomorrow - seems like worst is over Will need f/u me/APP in office after dc   Overall based upon scenario suspicious of passed stone though CBD only 3 mm goeas against but not microlithiasis.  2. S/p gastric sleeve (3/16)   LOS: 0  Foye Spurling, PA-S 02/24/2015  Seen with student who served as a Education administrator for this encounter. Gatha Mayer, MD, [facility] [phone] (pager) [phone] after 5 PM, weekends and holidays  02/24/2015 12:29 PM

## 2015-02-24 NOTE — Progress Notes (Signed)
[facility] PROGRESS NOTE  Suzanne Schaefer [id number] DOB: 10/10/1960 DOA: 02/23/2015 PCP: Hershey Kehr, MD  HPI/Brief narrative 55 y.o. female history of GERD and history of S/P laparoscopic sleeve gastrectomy last year in March presents to the ER because of abdominal pain with nausea. Patient's symptoms started 2 nights ago with pain around the epigastric area. Nonradiating. Denies any vomiting or diarrhea. Denies any fever or chills. Since pain is persistent came to the ER. Patient's labs revealed markedly elevated LFTs and lipase. CT scan shows acute pancreatitis. There is no definite stones. Patient does not drink alcohol. Patient at this time states her pain is largely resolved. Patient will be admitted for further management of acute peritonitis and markedly elevated LFTs. Denies any recent medication changes or sick contacts.  Assessment/Plan: 1. Acute pancreatitis - unclear etiology. Patient has markedly elevated LFTs with bilirubin are normal. Patient is afebrile. Denied alcohol. Has had prior history of cholecystectomy. Triglyceride levels normal. Lipase and LFT's trending down. Patient reports feeling better this AM. Will start clears, slowly advance as tolerated. Possible d/c tomorrow if stable. Appreciate input by GI. Recs for MRCP in about one month from now.  2. Markedly elevated LFTs - bilirubin is normal. Cause not clear. Acute hepatitis panel pending. Tylenol level normal. 3. History of sleeve gastrectomy for weight loss last year. 1. Stable 4. History of GERD. 1. Stable at present  Code Status: Full Family Communication: Pt in room Disposition Plan: Possible d/c tomorrow if stable   Consultants:  GI  Procedures:    Antibiotics: Anti-infectives    None      HPI/Subjective: Feels much better today. Willing to start eating.  Objective: Filed Vitals:   02/24/15 0104 02/24/15 0145 02/24/15 0209 02/24/15 0643  BP: 127/71  141/62 106/56  Pulse:  57 57  62  Temp:   98.1 F (36.7 C) 98.4 F (36.9 C)  TempSrc:   Oral Oral  Resp:  17 18 18   Height:   5\' 2"  (1.575 m)   Weight:   79.652 kg (175 lb 9.6 oz)   SpO2:  99% 100% 96%   No intake or output data in the 24 hours ending 02/24/15 1435 Filed Weights   02/24/15 0209  Weight: 79.652 kg (175 lb 9.6 oz)    Exam:   General:  Awake, in nad  Cardiovascular: regular, s1, s2  Respiratory: normal resp effort,no wheezing  Abdomen: soft,nondistended, pos BS  Musculoskeletal: perfused, no clubbing   Data Reviewed: Basic Metabolic Panel:  Recent Labs Lab 02/23/15 1539 02/24/15 0358  NA 139 142  K 4.2 3.5  CL 104 109  CO2 27 26  GLUCOSE 135* 93  BUN 20 15  CREATININE 0.49 0.60  CALCIUM 9.5 8.9   Liver Function Tests:  Recent Labs Lab 02/23/15 1539 02/24/15 0358  AST 1204* 579*  ALT 1006* 633*  ALKPHOS 214* 169*  BILITOT 0.8 0.5  PROT 7.7 6.1*  ALBUMIN 4.4 3.6    Recent Labs Lab 02/23/15 1539  LIPASE 1947*   No results for input(s): AMMONIA in the last 168 hours. CBC:  Recent Labs Lab 02/23/15 1539 02/24/15 0358  WBC 8.0 6.2  NEUTROABS  --  3.7  HGB 12.7 11.0*  HCT 38.4 34.1*  MCV 85.9 88.3  PLT 185 139*   Cardiac Enzymes: No results for input(s): CKTOTAL, CKMB, CKMBINDEX, TROPONINI in the last 168 hours. BNP (last 3 results) No results for input(s): BNP in the last 8760 hours.  ProBNP (last 3 results)  No results for input(s): PROBNP in the last 8760 hours.  CBG:  Recent Labs Lab 02/24/15 1157  GLUCAP 82    No results found for this or any previous visit (from the past 240 hour(s)).   Studies: US Abdomen Complete  02/23/2015  CLINICAL DATA:  55 year old female with epigastric pain and elevated liver function tests. History of cholecystectomy. EXAM: ABDOMEN ULTRASOUND COMPLETE COMPARISON:  CT dated 05/20/2013 and ultrasound dated 11/03/2013 bold under FINDINGS: Gallbladder: Cholecystectomy. Common bile duct: Diameter: 3 mm Liver: No  focal lesion identified. Within normal limits in parenchymal echogenicity. IVC: No abnormality visualized. Pancreas: Visualized portion unremarkable. Spleen: Size and appearance within normal limits. Right Kidney: Length: 10 cm. Echogenicity within normal limits. No mass or hydronephrosis visualized. Left Kidney: Length: 9 cm.  The left kidney is poorly visualized. Abdominal aorta: No aneurysm visualized. Other findings: None. IMPRESSION: Unremarkable abdominal ultrasound. Status post prior cholecystectomy. Electronically Signed   By: Anner Crete M.D.   On: 02/23/2015 21:15   Ct Abdomen Pelvis W Contrast  02/23/2015  CLINICAL DATA:  55 year old female with epigastric pain and nausea. Elevated LFTs and lipase. EXAM: CT ABDOMEN AND PELVIS WITH CONTRAST TECHNIQUE: Multidetector CT imaging of the abdomen and pelvis was performed using the standard protocol following bolus administration of intravenous contrast. CONTRAST:  123mL OMNIPAQUE IOHEXOL 300 MG/ML  SOLN COMPARISON:  Abdominal ultrasound dated 02/23/2015 and CT dated 05/20/2013 FINDINGS: The visualized lung bases are clear. No intra-abdominal free air. Trace free fluid within the pelvis. Cholecystectomy. Mild periportal edema. The liver otherwise appears unremarkable. There is mild peripancreatic haziness and stranding compatible with acute pancreatitis. No drainable fluid collection/abscess or pseudocyst. The spleen, adrenal glands, kidneys, visualized ureters, and urinary bladder appear unremarkable. The uterus is grossly unremarkable. There is a 1.9 x 3.0 cm complex cystic and solid lesion in the region of the left adnexa as seen on the prior CT. There has been interval decrease in the size of the cystic components of this structure. This may represent a complex ovarian cyst or multiple adjacent cysts/follicles. Evaluation is however limited on CT. Other etiologies including endometrioma or cystic neoplasm are not excluded. Ultrasound is recommended  for further characterization. This postsurgical changes of gastric bypass. No evidence of bowel obstruction or inflammation. Constipation. Normal appendix. The abdominal aorta and IVC appear patent. No portal venous gas identified. There is no adenopathy. There is a small fat containing umbilical hernia. Mild degenerative changes of the spine. No acute fracture. IMPRESSION: Acute pancreatitis.  No abscess. Constipation. No bowel obstruction or inflammation. Normal appendix. Electronically Signed   By: Anner Crete M.D.   On: 02/23/2015 23:17    Scheduled Meds: . cholecalciferol  2,000 Units Oral Once per day on Mon Wed Fri  . fluticasone  2 spray Each Nare QHS  . magnesium oxide  400 mg Oral Once per day on Mon Wed Fri  . pantoprazole  40 mg Oral Once per day on Mon Wed Fri  . rOPINIRole  0.75 mg Oral QHS   Continuous Infusions: . sodium chloride 100 mL/hr at 02/24/15 1108    Principal Problem:   Pancreatitis, acute Active Problems:   Acute pancreatitis    Sueo Cullen, [facility] Pager [phone]. If 7PM-7AM, please contact night-coverage at [email or web address], password [facility] 02/24/2015, 2:35 PM  LOS: 0 days

## 2015-02-25 LAB — HEPATITIS PANEL, ACUTE
HCV Ab: <0.1 s/co ratio (ref 0.0–0.9)
HEP A IGM: NEGATIVE
Hep B C IgM: NEGATIVE
Hepatitis B Surface Ag: NEGATIVE

## 2015-02-25 LAB — COMPREHENSIVE METABOLIC PANEL
ALBUMIN: 3.1 g/dL — AB (ref 3.5–5.0)
ALK PHOS: 142 U/L — AB (ref 38–126)
ALT: 387 U/L — ABNORMAL HIGH (ref 14–54)
AST: 307 U/L — AB (ref 15–41)
Anion gap: 8 (ref 5–15)
BILIRUBIN TOTAL: 0.6 mg/dL (ref 0.3–1.2)
BUN: 11 mg/dL (ref 6–20)
CO2: 26 mmol/L (ref 22–32)
Calcium: 8.7 mg/dL — ABNORMAL LOW (ref 8.9–10.3)
Chloride: 109 mmol/L (ref 101–111)
Creatinine, Ser: 0.56 mg/dL (ref 0.44–1.00)
GFR calc Af Amer: >60 mL/min (ref >60)
GFR calc non Af Amer: >60 mL/min (ref >60)
GLUCOSE: 91 mg/dL (ref 65–99)
Potassium: 3.8 mmol/L (ref 3.5–5.1)
Sodium: 143 mmol/L (ref 135–145)
Total Protein: 5.6 g/dL — ABNORMAL LOW (ref 6.5–8.1)

## 2015-02-25 LAB — GLUCOSE, CAPILLARY
GLUCOSE-CAPILLARY: 71 mg/dL (ref 65–99)
GLUCOSE-CAPILLARY: 72 mg/dL (ref 65–99)

## 2015-02-25 LAB — LIPASE, BLOOD: Lipase: 47 U/L (ref 11–51)

## 2015-02-25 MED ORDER — SODIUM CHLORIDE 0.9 % IV SOLN
INTRAVENOUS | Status: DC
Start: 2015-02-25 — End: 2015-02-25

## 2015-02-25 NOTE — Progress Notes (Signed)
Patient discharged to home, all discharge medications and instructions reviewed and questions answered.  Patient to be assisted to vehicle by wheelchair.  

## 2015-02-25 NOTE — Plan of Care (Signed)
Problem: Nutritional: Goal: Ability to achieve adequate nutritional intake will improve Outcome: Progressing Tolerating full liquids without complaints

## 2015-02-25 NOTE — Discharge Summary (Signed)
Physician Discharge Summary  Suzanne Schaefer [id number] DOB: [birth year] DOA: 02/23/2015  PCP: Elkhatib Kehr, MD  Admit date: 02/23/2015 Discharge date: 02/25/2015  Time spent: 20 minutes  Recommendations for Outpatient Follow-up:  1. Follow up with PCP in 2-3 weeks 2. Follow up with GI as scheduled 3. Recommend MRCP in one month as recommended by GI   Discharge Diagnoses:  Principal Problem:   Pancreatitis, acute Active Problems:   Acute pancreatitis   Elevated LFTs   Epigastric abdominal pain   Transaminitis   Discharge Condition: Improved  Diet recommendation: Low fat  Filed Weights   02/24/15 0209  Weight: 79.652 kg (175 lb 9.6 oz)    History of present illness:  Please review dictated H and P from 1/19 for details. Briefly, 55 y.o. female history of GERD and history of S/P laparoscopic sleeve gastrectomy last year in March presents to the ER because of abdominal pain with nausea. Patient's symptoms started 2 nights ago with pain around the epigastric area. Nonradiating. Denies any vomiting or diarrhea. Denies any fever or chills. Since pain is persistent came to the ER. Patient's labs revealed markedly elevated LFTs and lipase. CT scan shows acute pancreatitis. There is no definite stones. Patient does not drink alcohol. Patient at this time states her pain is largely resolved. Patient will be admitted for further management of acute peritonitis and markedly elevated LFTs. Denies any recent medication changes or sick contacts.  Hospital Course:  1. Acute pancreatitis - unclear etiology. Patient presented with markedly elevated LFTs with normal bilirubin. Patient had remained afebrile. Denied alcohol. Has had prior history of cholecystectomy. Triglyceride levels normal. Lipase and LFT's trending down steadily. Patient's diet was successfully advanced to a low fat diet. Appreciate input by GI. Recs for MRCP in about one month from now.  2. Markedly elevated LFTs - bilirubin is  normal. Cause not clear. Acute hepatitis panel was neg. Tylenol level normal. 3. History of sleeve gastrectomy for weight loss last year. 1. Stable 4. History of GERD. 1. Stable at present   Consultations:  GI  Discharge Exam: Filed Vitals:   02/24/15 1430 02/24/15 2255 02/24/15 2320 02/25/15 0520  BP: 117/58 131/55  149/69  Pulse: 67 98 48 50  Temp: 98.1 F (36.7 C) 98.6 F (37 C)  98.2 F (36.8 C)  TempSrc: Oral Oral  Oral  Resp: 20 18  18   Height:      Weight:      SpO2: 100% 100%  99%    General: awake, in nad Cardiovascular: regular, s1, s2 Respiratory: normal resp effort, no wheezing  Discharge Instructions     Medication List    TAKE these medications        ALREX 0.2 % Susp  Generic drug:  loteprednol  Place 1 drop into the left eye 2 (two) times daily as needed (irritation).     CENTRUM VITAMINTS PO  Take 1 each by mouth daily.     Diclofenac Sodium 2 % Soln  Apply 1 pump twice daily.     diphenhydramine-acetaminophen 25-500 MG Tabs tablet  Commonly known as:  TYLENOL PM  Take 1 tablet by mouth at bedtime as needed (aches and sleep.).     eszopiclone 2 MG Tabs tablet  Commonly known as:  LUNESTA  Take 1 tablet (2 mg total) by mouth at bedtime as needed for sleep. Take immediately before bedtime     fluticasone 50 MCG/ACT nasal spray  Commonly known as:  FLONASE  Place 2  sprays into both nostrils at bedtime.     ibuprofen 200 MG tablet  Commonly known as:  ADVIL,MOTRIN  Take 600 mg by mouth every 6 (six) hours as needed for headache or moderate pain.     magnesium oxide 400 MG tablet  Commonly known as:  MAG-OX  Take 400 mg by mouth 3 (three) times a week. Mon,Wed,Fri     meloxicam 15 MG tablet  Commonly known as:  MOBIC  Take 1 tablet (15 mg total) by mouth daily.     MIRALAX PO  Take 1 scoop by mouth at bedtime.     pantoprazole 40 MG tablet  Commonly known as:  PROTONIX  Take 1 tablet (40 mg total) by mouth daily.      rOPINIRole 0.25 MG tablet  Commonly known as:  REQUIP  Take 3 tablets (0.75 mg total) by mouth at bedtime.     Vitamin D-3 1000 units Caps  Take 2 capsules by mouth 3 (three) times a week.     ZYRTEC-D PO  Take 1 tablet by mouth every morning.       Allergies  Allergen Reactions  . Clindamycin/Lincomycin Rash       Follow-up Information    Follow up with Bachus Kehr, MD. Schedule an appointment as soon as possible for a visit in 2 weeks.   Specialty:  Internal Medicine   Contact information:   [place] [address] [phone]       Schedule an appointment as soon as possible for a visit with Silvano Rusk, MD.   Specialty:  Gastroenterology   Why:  follow up as scheduled   Contact information:   [address] [place] [address] [phone]        The results of significant diagnostics from this hospitalization (including imaging, microbiology, ancillary and laboratory) are listed below for reference.    Significant Diagnostic Studies: US Abdomen Complete  02/23/2015  CLINICAL DATA:  55 year old female with epigastric pain and elevated liver function tests. History of cholecystectomy. EXAM: ABDOMEN ULTRASOUND COMPLETE COMPARISON:  CT dated 05/20/2013 and ultrasound dated 11/03/2013 bold under FINDINGS: Gallbladder: Cholecystectomy. Common bile duct: Diameter: 3 mm Liver: No focal lesion identified. Within normal limits in parenchymal echogenicity. IVC: No abnormality visualized. Pancreas: Visualized portion unremarkable. Spleen: Size and appearance within normal limits. Right Kidney: Length: 10 cm. Echogenicity within normal limits. No mass or hydronephrosis visualized. Left Kidney: Length: 9 cm.  The left kidney is poorly visualized. Abdominal aorta: No aneurysm visualized. Other findings: None. IMPRESSION: Unremarkable abdominal ultrasound. Status post prior cholecystectomy. Electronically Signed   By: Anner Crete M.D.   On: 02/23/2015 21:15    Ct Abdomen Pelvis W Contrast  02/23/2015  CLINICAL DATA:  55 year old female with epigastric pain and nausea. Elevated LFTs and lipase. EXAM: CT ABDOMEN AND PELVIS WITH CONTRAST TECHNIQUE: Multidetector CT imaging of the abdomen and pelvis was performed using the standard protocol following bolus administration of intravenous contrast. CONTRAST:  14mL OMNIPAQUE IOHEXOL 300 MG/ML  SOLN COMPARISON:  Abdominal ultrasound dated 02/23/2015 and CT dated 05/20/2013 FINDINGS: The visualized lung bases are clear. No intra-abdominal free air. Trace free fluid within the pelvis. Cholecystectomy. Mild periportal edema. The liver otherwise appears unremarkable. There is mild peripancreatic haziness and stranding compatible with acute pancreatitis. No drainable fluid collection/abscess or pseudocyst. The spleen, adrenal glands, kidneys, visualized ureters, and urinary bladder appear unremarkable. The uterus is grossly unremarkable. There is a 1.9 x 3.0 cm complex cystic and solid  lesion in the region of the left adnexa as seen on the prior CT. There has been interval decrease in the size of the cystic components of this structure. This may represent a complex ovarian cyst or multiple adjacent cysts/follicles. Evaluation is however limited on CT. Other etiologies including endometrioma or cystic neoplasm are not excluded. Ultrasound is recommended for further characterization. This postsurgical changes of gastric bypass. No evidence of bowel obstruction or inflammation. Constipation. Normal appendix. The abdominal aorta and IVC appear patent. No portal venous gas identified. There is no adenopathy. There is a small fat containing umbilical hernia. Mild degenerative changes of the spine. No acute fracture. IMPRESSION: Acute pancreatitis.  No abscess. Constipation. No bowel obstruction or inflammation. Normal appendix. Electronically Signed   By: Anner Crete M.D.   On: 02/23/2015 23:17    Microbiology: No results  found for this or any previous visit (from the past 240 hour(s)).   Labs: Basic Metabolic Panel:  Recent Labs Lab 02/23/15 1539 02/24/15 0358 02/25/15 0425  NA 139 142 143  K 4.2 3.5 3.8  CL 104 109 109  CO2 27 26 26   GLUCOSE 135* 93 91  BUN 20 15 11   CREATININE 0.49 0.60 0.56  CALCIUM 9.5 8.9 8.7*   Liver Function Tests:  Recent Labs Lab 02/23/15 1539 02/24/15 0358 02/25/15 0425  AST 1204* 579* 307*  ALT 1006* 633* 387*  ALKPHOS 214* 169* 142*  BILITOT 0.8 0.5 0.6  PROT 7.7 6.1* 5.6*  ALBUMIN 4.4 3.6 3.1*    Recent Labs Lab 02/23/15 1539 02/25/15 0425  LIPASE 1947* 47   No results for input(s): AMMONIA in the last 168 hours. CBC:  Recent Labs Lab 02/23/15 1539 02/24/15 0358  WBC 8.0 6.2  NEUTROABS  --  3.7  HGB 12.7 11.0*  HCT 38.4 34.1*  MCV 85.9 88.3  PLT 185 139*   Cardiac Enzymes: No results for input(s): CKTOTAL, CKMB, CKMBINDEX, TROPONINI in the last 168 hours. BNP: BNP (last 3 results) No results for input(s): BNP in the last 8760 hours.  ProBNP (last 3 results) No results for input(s): PROBNP in the last 8760 hours.  CBG:  Recent Labs Lab 02/24/15 1157 02/24/15 1757 02/24/15 1905 02/25/15 0034 02/25/15 0634  GLUCAP 82 65 67 72 71    Signed:  CHIU, STEPHEN K  [facility] 02/25/2015, 9:22 AM

## 2015-02-25 NOTE — Progress Notes (Signed)
     [facility] Progress Note  Subjective:  Lipase 47, LFTs improving. No abd pain. No N/V. Wants to go home.   Objective:  Vital signs in last 24 hours: Temp:  [98.1 F (36.7 C)-98.6 F (37 C)] 98.2 F (36.8 C) (01/21 0520) Pulse Rate:  [48-98] 50 (01/21 0520) Resp:  [18-20] 18 (01/21 0520) BP: (117-149)/(55-69) 149/69 mmHg (01/21 0520) SpO2:  [99 %-100 %] 99 % (01/21 0520) Last BM Date: 02/24/15 General:   Alert,  Well-developed,    in NAD Heart:  Regular rate and rhythm; no murmurs Pulm;lungs clear Abdomen:  Soft, nontender and nondistended. Normal bowel sounds, without guarding, and without rebound.   Extremities:  Without edema. Neurologic: Alert and  oriented x4;  grossly normal neurologically. Psych: Alert and cooperative. Normal mood and affect.  Intake/Output from previous day: 01/20 0701 - 01/21 0700 In: 3536.9 [P.O.:600; I.V.:2936.9] Out: -  Intake/Output this shift:    Lab Results:  Recent Labs  02/23/15 1539 02/24/15 0358  WBC 8.0 6.2  HGB 12.7 11.0*  HCT 38.4 34.1*  PLT 185 139*   BMET  Recent Labs  02/23/15 1539 02/24/15 0358 02/25/15 0425  NA 139 142 143  K 4.2 3.5 3.8  CL 104 109 109  CO2 27 26 26   GLUCOSE 135* 93 91  BUN 20 15 11   CREATININE 0.49 0.60 0.56  CALCIUM 9.5 8.9 8.7*   LFT  Recent Labs  02/25/15 0425  PROT 5.6*  ALBUMIN 3.1*  AST 307*  ALT 387*  ALKPHOS 142*  BILITOT 0.6   PT/INR  Recent Labs  02/23/15 2024 02/24/15 0740  LABPROT 13.9 14.6  INR 1.05 1.12   Hepatitis Panel  Recent Labs  02/24/15 0740  HEPBSAG Negative  HCVAB <0.1  HEPAIGM Negative  HEPBIGM Negative      ASSESSMENT/PLAN:  55 yo female s/p gastric sleve, admitted with abd pain, elevated lipase and LFTs. [place] neg, CTY showed pancreatitis.  Feels better today--says no pain since Thursday. May have passed a stone even though s/p cholecystectomy.  Should be able to go home on low fat diet today.F/U in GI office in about 2  weeks. Will need repeat  LFTs and lipase at that time. Continue PPI.      LOS: 1 day   Hvozdovic, Vita Barley PA-C 02/25/2015, Pager [phone] Mon-Fri 8a-5p [phone] after 5p, weekends, holidays  Agree w/ Ms. Hvozdovic's note and mangement. Gatha Mayer, MD, [facility]

## 2015-02-27 ENCOUNTER — Encounter: Payer: Self-pay | Admitting: Physician Assistant

## 2015-02-27 ENCOUNTER — Telehealth: Payer: Self-pay | Admitting: Internal Medicine

## 2015-02-27 LAB — URINE CULTURE

## 2015-02-27 NOTE — Telephone Encounter (Signed)
I will assume her care She needs appt me or APP in 1-2 weeks to f/u panceratitis

## 2015-02-27 NOTE — Telephone Encounter (Signed)
LM for Patient to CB to sch'd an appt

## 2015-03-02 MED FILL — rOPINIRole HCL 0.25 MG TABS: 0.25 | 90 days supply | Qty: 270 | Fill #0

## 2015-03-02 MED FILL — PANTOPRAZOLE SOD DR 40 MG T: 40 | 90 days supply | Qty: 90 | Fill #0

## 2015-03-13 ENCOUNTER — Ambulatory Visit (INDEPENDENT_AMBULATORY_CARE_PROVIDER_SITE_OTHER): Payer: 59 | Admitting: Internal Medicine

## 2015-03-13 ENCOUNTER — Encounter: Payer: Self-pay | Admitting: Internal Medicine

## 2015-03-13 VITALS — BP 90/60 | HR 85 | Wt 173.0 lb

## 2015-03-13 DIAGNOSIS — J302 Other seasonal allergic rhinitis: Secondary | ICD-10-CM

## 2015-03-13 DIAGNOSIS — J3089 Other allergic rhinitis: Secondary | ICD-10-CM

## 2015-03-13 DIAGNOSIS — R7401 Elevation of levels of liver transaminase levels: Secondary | ICD-10-CM

## 2015-03-13 DIAGNOSIS — K8500 Idiopathic acute pancreatitis without necrosis or infection: Secondary | ICD-10-CM

## 2015-03-13 DIAGNOSIS — J309 Allergic rhinitis, unspecified: Secondary | ICD-10-CM

## 2015-03-13 DIAGNOSIS — R740 Nonspecific elevation of levels of transaminase and lactic acid dehydrogenase [LDH]: Secondary | ICD-10-CM

## 2015-03-13 DIAGNOSIS — Z9884 Bariatric surgery status: Secondary | ICD-10-CM | POA: Diagnosis not present

## 2015-03-13 NOTE — Assessment & Plan Note (Signed)
Doing well 

## 2015-03-13 NOTE — Assessment & Plan Note (Signed)
Seasonal and perennial allergic rhinitis Zyrtec D

## 2015-03-13 NOTE — Progress Notes (Signed)
Pre visit review using our clinic review tool, if applicable. No additional management support is needed unless otherwise documented below in the visit note. 

## 2015-03-13 NOTE — Assessment & Plan Note (Signed)
LFTs 

## 2015-03-13 NOTE — Progress Notes (Signed)
Subjective:  Patient ID: Suzanne Schaefer, female    DOB: 25-Jun-1960  Age: 55 y.o. MRN: [id number]  CC: No chief complaint on file.   HPI Suzanne Schaefer presents for pancreatitis f/u - hosp stay reviewed 1/20-1/21/17:  Per Dr Carlean Purl - "55 yo female s/p gastric sleve, admitted with abd pain, elevated lipase and LFTs. [place] neg, CTY showed pancreatitis. Feels better today--says no pain since Thursday. May have passed a stone even though s/p cholecystectomy. Should be able to go home on low fat diet today.F/U in GI office in about 2 weeks. Will need repeat LFTs and lipase at that time. Continue PPI."    Outpatient Prescriptions Prior to Visit  Medication Sig Dispense Refill  . ALREX 0.2 % SUSP Place 1 drop into the left eye 2 (two) times daily as needed (irritation).   1  . Cetirizine-Pseudoephedrine (ZYRTEC-D PO) Take 1 tablet by mouth every morning.     . Cholecalciferol (VITAMIN D-3) 1000 UNITS CAPS Take 2 capsules by mouth 3 (three) times a week.     . Diclofenac Sodium 2 % SOLN Apply 1 pump twice daily. (Patient taking differently: Apply 1 pump twice daily as needed.) 112 g 3  . diphenhydramine-acetaminophen (TYLENOL PM) 25-500 MG TABS Take 1 tablet by mouth at bedtime as needed (aches and sleep.).     Marland Kitchen eszopiclone (LUNESTA) 2 MG TABS tablet Take 1 tablet (2 mg total) by mouth at bedtime as needed for sleep. Take immediately before bedtime (Patient taking differently: Take 2 mg by mouth at bedtime as needed for sleep. Takes mainly on Friday and Saturday night.) 30 tablet 2  . fluticasone (FLONASE) 50 MCG/ACT nasal spray Place 2 sprays into both nostrils at bedtime.    . magnesium oxide (MAG-OX) 400 MG tablet Take 400 mg by mouth 3 (three) times a week. Mon,Wed,Fri    . Multiple Vitamins-Minerals (CENTRUM VITAMINTS PO) Take 1 each by mouth daily.    . pantoprazole (PROTONIX) 40 MG tablet Take 1 tablet (40 mg total) by mouth daily. (Patient taking differently: Take 40 mg by mouth 3 (three)  times a week. Monday,Wednesdays, and Fridays) 90 tablet 3  . Polyethylene Glycol 3350 (MIRALAX PO) Take 1 scoop by mouth at bedtime.     Marland Kitchen rOPINIRole (REQUIP) 0.25 MG tablet Take 3 tablets (0.75 mg total) by mouth at bedtime. 270 tablet 3  . ibuprofen (ADVIL,MOTRIN) 200 MG tablet Take 600 mg by mouth every 6 (six) hours as needed for headache or moderate pain.    . meloxicam (MOBIC) 15 MG tablet Take 1 tablet (15 mg total) by mouth daily. 90 tablet 1   No facility-administered medications prior to visit.    ROS Review of Systems  Constitutional: Negative for chills, activity change, appetite change, fatigue and unexpected weight change.  HENT: Negative for congestion, mouth sores and sinus pressure.   Eyes: Negative for visual disturbance.  Respiratory: Negative for cough and chest tightness.   Gastrointestinal: Negative for nausea and abdominal pain.  Genitourinary: Negative for frequency, difficulty urinating and vaginal pain.  Musculoskeletal: Negative for back pain and gait problem.  Skin: Negative for pallor and rash.  Neurological: Negative for dizziness, tremors, weakness, numbness and headaches.  Psychiatric/Behavioral: Negative for confusion and sleep disturbance.    Objective:  BP 90/60 mmHg  Pulse 85  Wt 173 lb (78.472 kg)  SpO2 95%  BP Readings from Last 3 Encounters:  03/13/15 90/60  02/25/15 127/77  02/15/15 100/60    Wt Readings  from Last 3 Encounters:  03/13/15 173 lb (78.472 kg)  02/24/15 175 lb 9.6 oz (79.652 kg)  02/15/15 181 lb (82.101 kg)    Physical Exam  Constitutional: She appears well-developed. No distress.  HENT:  Head: Normocephalic.  Right Ear: External ear normal.  Left Ear: External ear normal.  Nose: Nose normal.  Mouth/Throat: Oropharynx is clear and moist.  Eyes: Conjunctivae are normal. Pupils are equal, round, and reactive to light. Right eye exhibits no discharge. Left eye exhibits no discharge.  Neck: Normal range of motion. Neck  supple. No JVD present. No tracheal deviation present. No thyromegaly present.  Cardiovascular: Normal rate, regular rhythm and normal heart sounds.   Pulmonary/Chest: No stridor. No respiratory distress. She has no wheezes.  Abdominal: Soft. Bowel sounds are normal. She exhibits no distension and no mass. There is no tenderness. There is no rebound and no guarding.  Musculoskeletal: She exhibits no edema or tenderness.  Lymphadenopathy:    She has no cervical adenopathy.  Neurological: She displays normal reflexes. No cranial nerve deficit. She exhibits normal muscle tone. Coordination normal.  Skin: No rash noted. No erythema.  Psychiatric: She has a normal mood and affect. Her behavior is normal. Judgment and thought content normal.    Lab Results  Component Value Date   WBC 6.2 02/24/2015   HGB 11.0* 02/24/2015   HCT 34.1* 02/24/2015   PLT 139* 02/24/2015   GLUCOSE 91 02/25/2015   CHOL 156 02/24/2015   TRIG 68 02/24/2015   HDL 70 02/24/2015   LDLDIRECT 115.2 01/27/2013   LDLCALC 72 02/24/2015   ALT 387* 02/25/2015   AST 307* 02/25/2015   NA 143 02/25/2015   K 3.8 02/25/2015   CL 109 02/25/2015   CREATININE 0.56 02/25/2015   BUN 11 02/25/2015   CO2 26 02/25/2015   TSH 1.37 02/14/2014   INR 1.12 02/24/2015   HGBA1C 5.4 08/31/2014    No results found.  Assessment & Plan:   Diagnoses and all orders for this visit:  Idiopathic acute pancreatitis  Transaminitis  Seasonal and perennial allergic rhinitis  I have discontinued Ms. Wecker's ibuprofen and meloxicam. I am also having her maintain her Polyethylene Glycol 3350 (MIRALAX PO), Cetirizine-Pseudoephedrine (ZYRTEC-D PO), eszopiclone, diphenhydramine-acetaminophen, Vitamin D-3, fluticasone, Diclofenac Sodium, rOPINIRole, pantoprazole, Multiple Vitamins-Minerals (CENTRUM VITAMINTS PO), ALREX, and magnesium oxide.  No orders of the defined types were placed in this encounter.     Follow-up: No Follow-up on  file.  Odom Kehr, MD

## 2015-03-13 NOTE — Assessment & Plan Note (Signed)
1/17: Per Dr Carlean Purl - "55 yo female s/p gastric sleve, admitted with abd pain, elevated lipase and LFTs. [place] neg, CTY showed pancreatitis. Feels better today--says no pain since Thursday. May have passed a stone even though s/p cholecystectomy. Should be able to go home on low fat diet today.F/U in GI office in about 2 weeks. Will need repeat LFTs and lipase at that time. Continue PPI."

## 2015-03-16 ENCOUNTER — Ambulatory Visit (INDEPENDENT_AMBULATORY_CARE_PROVIDER_SITE_OTHER): Payer: 59 | Admitting: Physician Assistant

## 2015-03-16 ENCOUNTER — Encounter: Payer: Self-pay | Admitting: Physician Assistant

## 2015-03-16 VITALS — BP 90/60 | HR 96 | Ht 63.0 in | Wt 172.0 lb

## 2015-03-16 DIAGNOSIS — K859 Acute pancreatitis without necrosis or infection, unspecified: Secondary | ICD-10-CM

## 2015-03-16 DIAGNOSIS — R7989 Other specified abnormal findings of blood chemistry: Secondary | ICD-10-CM | POA: Diagnosis not present

## 2015-03-16 DIAGNOSIS — R945 Abnormal results of liver function studies: Secondary | ICD-10-CM

## 2015-03-16 NOTE — Progress Notes (Signed)
Agree with Ms. Esterwood's assessment and plan. Amarrah Meinhart E. Decklyn Hyder, MD, FACG   

## 2015-03-16 NOTE — Progress Notes (Signed)
Patient ID: Suzanne Schaefer, female   DOB: 1960-03-04, 55 y.o.   MRN: [id number]   Subjective:    Patient ID: Suzanne Schaefer, female    DOB: 05-Jul-1960, 55 y.o.   MRN: [id number]  HPI  Suzanne Schaefer is a pleasant 55 year old 55 year old seen today in post hospital follow-up. She was seen by Dr. Carlean Purl during recent hospitalization in January 2017 at which time she had an acute pancreatitis. CT scan on 02/23/2015 showed her to be status post cholecystectomy, with mild. Pancreatic haziness and stranding consistent with acute pancreatitis. Upper abdominal ultrasound during that same admission, status post cholecystectomy with common bile duct of 3 mm. She did have elevated LFTs with this episode with AST of 1204 a LT of 1006 alkaline phosphatase 214 and total bilirubin 0.8 on 02/23/2015.  02/25/2015, AST 307 MALT 387 alkaline phosphatase 142, and total bilirubin 0.6. Lipase was initially 1947 and within 48 hours had normalized to 47. Patient says that she had abrupt onset of fairly severe epigastric pain with this episode had resolved by the time she was discharged. Since discharge she has had no problems with abdominal pain or discomfort no nausea or vomiting. Appetite has been fine. It was felt that she had possibly passed a small common bile duct stone .  Patient has had a 95 pound weight loss over the past 1 year after undergoing a gastric bypass procedure with gastric sleeve in March 2016. Gallbladder was removed 1990. She had prior colonoscopy done in 2007 per Dr. Deatra Ina, which was negative with the exception of diverticulosis.  Review of Systems Pertinent positive and negative review of systems were noted in the above HPI section.  All other review of systems was otherwise negative.  Outpatient Encounter Prescriptions as of 03/16/2015  Medication Sig  . ALREX 0.2 % SUSP Place 1 drop into the left eye 2 (two) times daily as needed (irritation).   . Cetirizine-Pseudoephedrine (ZYRTEC-D PO) Take 1 tablet by mouth every  morning.   . Cholecalciferol (VITAMIN D-3) 1000 UNITS CAPS Take 2 capsules by mouth 3 (three) times a week.   . Diclofenac Sodium 2 % SOLN Apply 1 pump twice daily. (Patient taking differently: Apply 1 pump twice daily as needed.)  . diphenhydramine-acetaminophen (TYLENOL PM) 25-500 MG TABS Take 1 tablet by mouth at bedtime as needed (aches and sleep.).   Marland Kitchen eszopiclone (LUNESTA) 2 MG TABS tablet Take 1 tablet (2 mg total) by mouth at bedtime as needed for sleep. Take immediately before bedtime (Patient taking differently: Take 2 mg by mouth at bedtime as needed for sleep. Takes mainly on Friday and Saturday night.)  . fluticasone (FLONASE) 50 MCG/ACT nasal spray Place 2 sprays into both nostrils at bedtime.  . magnesium oxide (MAG-OX) 400 MG tablet Take 400 mg by mouth 3 (three) times a week. Mon,Wed,Fri  . Multiple Vitamins-Minerals (CENTRUM VITAMINTS PO) Take 1 each by mouth daily.  . pantoprazole (PROTONIX) 40 MG tablet Take 1 tablet (40 mg total) by mouth daily. (Patient taking differently: Take 40 mg by mouth 3 (three) times a week. Monday,Wednesdays, and Fridays)  . Polyethylene Glycol 3350 (MIRALAX PO) Take 1 scoop by mouth at bedtime.   Marland Kitchen rOPINIRole (REQUIP) 0.25 MG tablet Take 3 tablets (0.75 mg total) by mouth at bedtime.   No facility-administered encounter medications on file as of 03/16/2015.   Allergies  Allergen Reactions  . Ciprofloxacin Nausea And Vomiting  . Hydrocodone Nausea And Vomiting  . Clindamycin/Lincomycin Rash   Patient Active Problem List  Diagnosis Date Noted  . Pancreatitis, acute 02/24/2015  . Acute pancreatitis 02/24/2015  . Elevated LFTs   . Epigastric abdominal pain   . Transaminitis   . Capsulitis of toe of left foot 12/06/2014  . S/P laparoscopic sleeve gastrectomy 04/05/2014  . Acute dysfunction of left Eustachian tube 03/23/2014  . Well adult exam 02/10/2014  . Adenopathy, cervical 01/19/2014  . Osteoarthritis of left shoulder 01/19/2014  . Right  rotator cuff tendinitis 01/19/2014  . Family history of malignant neoplasm of ovary 10/19/2013  . Pes planus 09/23/2013  . Increased endometrial stripe thickness 09/10/2013  . Other and unspecified ovarian cyst 06/09/2013  . Fracture of 5th metatarsal 04/02/2013  . Family history of ovarian cancer 02/11/2013  . Overweight 02/11/2013  . Osteoarthritis of left knee 12/10/2012  . Condyloma acuminatum in female 12/07/2012  . Left lateral ankle pain 11/12/2012  . Left knee pain 10/22/2012  . Patellofemoral pain syndrome 10/22/2012  . Chronic venous insufficiency 07/06/2012  . Herniated lumbar disc without myelopathy 06/08/2012  . Routine health maintenance 01/23/2011  . ANEMIA, IRON DEFICIENCY 01/18/2010  . BACK PAIN 08/04/2007  . Seasonal and perennial allergic rhinitis 01/20/2007  . DIVERTICULOSIS OF COLON 01/20/2007  . KNEE PAIN, LEFT 01/20/2007  . Obstructive sleep apnea 12/02/2006  . RESTLESS LEG SYNDROME 12/02/2006   Social History   Social History  . Marital Status: Divorced    Spouse Name: N/A  . Number of Children: 2  . Years of Education: N/A   Occupational History  . Not on file.   Social History Main Topics  . Smoking status: Never Smoker   . Smokeless tobacco: Never Used  . Alcohol Use: 0.0 oz/week    0 Standard drinks or equivalent per week     Comment: rare occasional use  . Drug Use: No  . Sexual Activity:    Partners: Male   Other Topics Concern  . Not on file   Social History Narrative   HSG. Married 69- divorce '89. 2 daughters '86, 88. Work Educational psychologist. Lives in own home daughters at home    Suzanne Schaefer's family history includes Allergies in her mother; Asthma in her mother; Atrial fibrillation in her father; COPD in her mother; Cancer in her maternal uncle, paternal grandfather, and paternal uncle; Cancer (age of onset: 40) in her mother; Glaucoma in her father; Hypertension in her father; Obesity in her father.      Objective:      Filed Vitals:   03/16/15 0835  BP: 90/60  Pulse: 96    Physical Exam  well-developed white female in no acute distress, pleasant blood pressure 90/60 pulse 96 height 5 foot 3 weight 172. HEENT ;nontraumatic, cephalic EOMI PERRLA sclera anicteric, Cardiovascular; regular rate and rhythm with S1-S2 no murmur or gallop, Pulmonary; clear bilaterally, Abdomen; soft nontender nondistended bowel sounds are active there is no palpable mass or hepatosplenomegaly, Rectal; exam not done, Ext; no clubbing cyanosis or edema skin warm dry, Neuropsych ;mood and affect appropriate     Assessment & Plan:   #1 55 yo female with recent admit with acute mild pancreatitis with elevated LFT's- pt is s/p cholecystectomy and it was suspected she may have passed a small stone  #2 s/p gastric bypass- gastric sleeve 04/2014- 95 pound weight loss #3 diverticulosis #4 colon screening- last colon 2007- negative - should have follow up at some point in the next year or so  Plan; Will repeat labs today- cbc,CMET,lipase Low fat diet  Discussed  MRCP with pt , and will schedule after review labs .  Pt established with Dr Olene Floss PA-C 03/16/2015   Cc: Plotnikov, Evie Lacks, MD

## 2015-03-16 NOTE — Patient Instructions (Signed)
Please go to the basement level to have your labs drawn.  

## 2015-03-21 ENCOUNTER — Other Ambulatory Visit (INDEPENDENT_AMBULATORY_CARE_PROVIDER_SITE_OTHER): Payer: 59

## 2015-03-21 DIAGNOSIS — R945 Abnormal results of liver function studies: Principal | ICD-10-CM

## 2015-03-21 DIAGNOSIS — R7989 Other specified abnormal findings of blood chemistry: Secondary | ICD-10-CM

## 2015-03-21 DIAGNOSIS — Z9884 Bariatric surgery status: Secondary | ICD-10-CM

## 2015-03-21 DIAGNOSIS — K859 Acute pancreatitis without necrosis or infection, unspecified: Secondary | ICD-10-CM | POA: Diagnosis not present

## 2015-03-21 DIAGNOSIS — Z0000 Encounter for general adult medical examination without abnormal findings: Secondary | ICD-10-CM | POA: Diagnosis not present

## 2015-03-21 LAB — BASIC METABOLIC PANEL
BUN: 21 mg/dL (ref 6–23)
CALCIUM: 9.6 mg/dL (ref 8.4–10.5)
CO2: 27 mEq/L (ref 19–32)
CREATININE: 0.73 mg/dL (ref 0.40–1.20)
Chloride: 103 mEq/L (ref 96–112)
GFR: 88.32 mL/min (ref >60.00)
GLUCOSE: 101 mg/dL — AB (ref 70–99)
Potassium: 3.9 mEq/L (ref 3.5–5.1)
Sodium: 140 mEq/L (ref 135–145)

## 2015-03-21 LAB — CBC WITH DIFFERENTIAL/PLATELET
BASOS ABS: 0 10*3/uL (ref 0.0–0.1)
BASOS PCT: 0.7 % (ref 0.0–3.0)
Basophils Absolute: 0 10*3/uL (ref 0.0–0.1)
Basophils Relative: 0.8 % (ref 0.0–3.0)
EOS ABS: 0.1 10*3/uL (ref 0.0–0.7)
EOS PCT: 1.9 % (ref 0.0–5.0)
Eosinophils Absolute: 0.1 10*3/uL (ref 0.0–0.7)
Eosinophils Relative: 1.9 % (ref 0.0–5.0)
HCT: 38.5 % (ref 36.0–46.0)
HEMATOCRIT: 38.4 % (ref 36.0–46.0)
HEMOGLOBIN: 13.2 g/dL (ref 12.0–15.0)
Hemoglobin: 13.1 g/dL (ref 12.0–15.0)
LYMPHS ABS: 1.6 10*3/uL (ref 0.7–4.0)
LYMPHS ABS: 1.6 10*3/uL (ref 0.7–4.0)
Lymphocytes Relative: 32.9 % (ref 12.0–46.0)
Lymphocytes Relative: 33.3 % (ref 12.0–46.0)
MCHC: 34.3 g/dL (ref 30.0–36.0)
MCHC: 34.1 g/dL (ref 30.0–36.0)
MCV: 84.3 fl (ref 78.0–100.0)
MCV: 84.3 fl (ref 78.0–100.0)
MONO ABS: 0.3 10*3/uL (ref 0.1–1.0)
MONO ABS: 0.4 10*3/uL (ref 0.1–1.0)
Monocytes Relative: 6.4 % (ref 3.0–12.0)
Monocytes Relative: 7.5 % (ref 3.0–12.0)
NEUTROS ABS: 2.8 10*3/uL (ref 1.4–7.7)
NEUTROS PCT: 58.1 % (ref 43.0–77.0)
NEUTROS PCT: 56.5 % (ref 43.0–77.0)
Neutro Abs: 2.7 10*3/uL (ref 1.4–7.7)
Platelets: 184 10*3/uL (ref 150.0–400.0)
Platelets: 180 10*3/uL (ref 150.0–400.0)
RBC: 4.56 Mil/uL (ref 3.87–5.11)
RBC: 4.56 Mil/uL (ref 3.87–5.11)
RDW: 14.2 % (ref 11.5–15.5)
RDW: 13.9 % (ref 11.5–15.5)
WBC: 4.8 10*3/uL (ref 4.0–10.5)
WBC: 4.7 10*3/uL (ref 4.0–10.5)

## 2015-03-21 LAB — HEPATIC FUNCTION PANEL
ALBUMIN: 4.2 g/dL (ref 3.5–5.2)
ALK PHOS: 133 U/L — AB (ref 39–117)
ALT: 16 U/L (ref 0–35)
AST: 49 U/L — ABNORMAL HIGH (ref 0–37)
BILIRUBIN TOTAL: 0.5 mg/dL (ref 0.2–1.2)
Bilirubin, Direct: 0.1 mg/dL (ref 0.0–0.3)
Total Protein: 7.2 g/dL (ref 6.0–8.3)

## 2015-03-21 LAB — URINALYSIS
Bilirubin Urine: NEGATIVE
HGB URINE DIPSTICK: NEGATIVE
KETONES UR: NEGATIVE
LEUKOCYTES UA: NEGATIVE
Nitrite: NEGATIVE
Specific Gravity, Urine: >=1.03 — AB (ref 1.000–1.030)
Total Protein, Urine: NEGATIVE
URINE GLUCOSE: NEGATIVE
UROBILINOGEN UA: 0.2 (ref 0.0–1.0)
pH: 5.5 (ref 5.0–8.0)

## 2015-03-21 LAB — COMPREHENSIVE METABOLIC PANEL
ALBUMIN: 4.2 g/dL (ref 3.5–5.2)
ALK PHOS: 133 U/L — AB (ref 39–117)
ALT: 16 U/L (ref 0–35)
AST: 50 U/L — ABNORMAL HIGH (ref 0–37)
BILIRUBIN TOTAL: 0.5 mg/dL (ref 0.2–1.2)
BUN: 21 mg/dL (ref 6–23)
CO2: 29 mEq/L (ref 19–32)
Calcium: 9.5 mg/dL (ref 8.4–10.5)
Chloride: 103 mEq/L (ref 96–112)
Creatinine, Ser: 0.74 mg/dL (ref 0.40–1.20)
GFR: 86.94 mL/min (ref >60.00)
GLUCOSE: 98 mg/dL (ref 70–99)
Potassium: 3.9 mEq/L (ref 3.5–5.1)
SODIUM: 139 meq/L (ref 135–145)
TOTAL PROTEIN: 7.2 g/dL (ref 6.0–8.3)

## 2015-03-21 LAB — LIPID PANEL
CHOLESTEROL: 185 mg/dL (ref 0–200)
HDL: 78.3 mg/dL (ref >39.00)
LDL Cholesterol: 93 mg/dL (ref 0–99)
NonHDL: 106.61
Total CHOL/HDL Ratio: 2
Triglycerides: 67 mg/dL (ref 0.0–149.0)
VLDL: 13.4 mg/dL (ref 0.0–40.0)

## 2015-03-21 LAB — TSH: TSH: 0.84 u[IU]/mL (ref 0.35–4.50)

## 2015-03-21 LAB — VITAMIN D 25 HYDROXY (VIT D DEFICIENCY, FRACTURES): VITD: 56.67 ng/mL (ref 30.00–100.00)

## 2015-03-21 LAB — VITAMIN B12: Vitamin B-12: 1159 pg/mL — ABNORMAL HIGH (ref 211–911)

## 2015-03-21 LAB — AMYLASE: Amylase: 36 U/L (ref 27–131)

## 2015-03-21 LAB — LIPASE: Lipase: 18 U/L (ref 11.0–59.0)

## 2015-03-27 ENCOUNTER — Other Ambulatory Visit: Payer: Self-pay

## 2015-03-27 DIAGNOSIS — R945 Abnormal results of liver function studies: Secondary | ICD-10-CM

## 2015-03-27 DIAGNOSIS — Z9049 Acquired absence of other specified parts of digestive tract: Secondary | ICD-10-CM

## 2015-03-27 DIAGNOSIS — R7989 Other specified abnormal findings of blood chemistry: Secondary | ICD-10-CM

## 2015-03-27 DIAGNOSIS — K859 Acute pancreatitis, unspecified: Secondary | ICD-10-CM

## 2015-03-28 ENCOUNTER — Other Ambulatory Visit: Payer: Self-pay

## 2015-04-05 ENCOUNTER — Encounter: Payer: 59 | Attending: Surgery | Admitting: Dietician

## 2015-04-05 ENCOUNTER — Encounter: Payer: Self-pay | Admitting: Dietician

## 2015-04-05 DIAGNOSIS — Z713 Dietary counseling and surveillance: Secondary | ICD-10-CM | POA: Diagnosis not present

## 2015-04-05 NOTE — Patient Instructions (Signed)
Goals:  Follow Phase 3B: High Protein + Non-Starchy Vegetables  Eat 3-6 small meals/snacks, every 3-5 hrs  Increase lean protein foods to meet 60g goal  Increase fluid intake to 64oz +  Avoid drinking 15 minutes before, during and 30 minutes after eating  Aim for >30 min of physical activity daily  Add vegetables to lunch and dinner  Continue having snacks only if you are hungry  Surgery date: 04/05/2014 Surgery type: Gastric sleeve Start weight at [facility]: 269 lbs on 01/08/2014 Weight today: 170 lbs Weight change: 14 lbs  Total weight loss: 99 lbs Weight loss goal: 140-150 lbs  TANITA BODY COMP RESULTS  03/14/14 04/19/14 05/31/14 06/27/14 09/27/14 12/12/14 01/03/15 04/05/15  BMI (kg/m^2) 47.7 43.0 40.7 38.8 34.9 33.0 32.7 30.1  Fat Mass (lbs) 134.5 123.5 98.5 99.5 79.0 63.0 71.0 57.5  Fat Free Mass (lbs) 135 119.0 131.5 119.5 118.0 123.5 113.5 112.5  Total Body Water (lbs) 99 87.0 96.5 87.5 86.5 90.5 83.0 82.5

## 2015-04-05 NOTE — Progress Notes (Signed)
  Follow-up visit:  12 Months Post-Operative Sleeve Gastrectomy Surgery  Medical Nutrition Therapy:  Appt start time: 850 end time:  67  Primary concerns today: Post-operative Bariatric Surgery Nutrition Management. Returns with a 14 lbs weight loss. She has begun [employer] and goes 3x a week. She has noticed that her weight loss will plateau periodically; still plans to try to lose more weight. Was hospitalized with pancreatitis recently. Practiced a low fat diet afterward and has since been adding back her "normal" foods. Interested in adding caffeine back; meeting fluid needs. Plans to have a tummy tuck eventually.   Surgery date: 04/05/2014 Surgery type: Gastric sleeve Start weight at [facility]: 269 lbs on 01/08/2014 Weight today: 170 lbs Weight change: 14 lbs  Total weight loss: 99 lbs Weight loss goal: 140-150 lbs  TANITA BODY COMP RESULTS  03/14/14 04/19/14 05/31/14 06/27/14 09/27/14 12/12/14 01/03/15 04/05/15  BMI (kg/m^2) 47.7 43.0 40.7 38.8 34.9 33.0 32.7 30.1  Fat Mass (lbs) 134.5 123.5 98.5 99.5 79.0 63.0 71.0 57.5  Fat Free Mass (lbs) 135 119.0 131.5 119.5 118.0 123.5 113.5 112.5  Total Body Water (lbs) 99 87.0 96.5 87.5 86.5 90.5 83.0 82.5                  Preferred Learning Style:   No preference indicated   Learning Readiness:   Ready  24-hr recall: B (6 AM): Premier Protein or [employer] protein (25-30 g) Snk (8:30 AM): scrambled egg (6 g) L (PM): 3 oz chicken and beef with cheese (21 g) Snk (PM): none D (PM): homemade chili with cheese (21g) Snk (PM): SF popscile  Fluid intake: 16 oz of protein shake, 36-48 oz water (~64 oz) Estimated total protein intake: 71-85 g  Medications:  See list  Supplementation: Taking and added 2000 IU of vitamin D 3x a week  Using straws: sometimes if driving, not often Drinking while eating: No Hair loss: No - though having some breakage Carbonated beverages: No N/V/D/C:   taking miralax daily to keep her regular Dumping syndrome: No  Recent physical activity:  [facility] program MWF  Progress Towards Goal(s):  In progress.   Nutritional Diagnosis:  [place]-3.3 Overweight/obesity related to past poor dietary habits and physical inactivity as evidenced by patient w/ recent sleeve gastrectomy surgery following dietary guidelines for continued weight loss.    Intervention:  Nutrition education/diet reinforcement Goals:  Follow Phase 3B: High Protein + Non-Starchy Vegetables  Eat 3-6 small meals/snacks, every 3-5 hrs  Increase lean protein foods to meet 60g goal  Increase fluid intake to 64oz +  Avoid drinking 15 minutes before, during and 30 minutes after eating  Aim for >30 min of physical activity daily - Look into [facility] or another option for after BELT is complete  Add vegetables to lunch and dinner  Continue having snacks only if you are hungry   Teaching Method Utilized:  Visual Auditory Hands on  Barriers to learning/adherence to lifestyle change: none  Demonstrated degree of understanding via:  Teach Back   Monitoring/Evaluation:  Dietary intake, exercise, and body weight. Follow up in 3 months for 15 month post-op visit.

## 2015-04-13 ENCOUNTER — Other Ambulatory Visit: Payer: Self-pay | Admitting: Physician Assistant

## 2015-04-13 ENCOUNTER — Ambulatory Visit (HOSPITAL_COMMUNITY)
Admission: RE | Admit: 2015-04-13 | Discharge: 2015-04-13 | Disposition: A | Discharge status: Home / Self Care | Payer: 59 | Source: Ambulatory Visit | Attending: Physician Assistant | Admitting: Physician Assistant

## 2015-04-13 DIAGNOSIS — Z9049 Acquired absence of other specified parts of digestive tract: Secondary | ICD-10-CM

## 2015-04-13 DIAGNOSIS — K859 Acute pancreatitis without necrosis or infection, unspecified: Secondary | ICD-10-CM | POA: Diagnosis not present

## 2015-04-13 DIAGNOSIS — R935 Abnormal findings on diagnostic imaging of other abdominal regions, including retroperitoneum: Secondary | ICD-10-CM | POA: Diagnosis not present

## 2015-04-13 DIAGNOSIS — R7989 Other specified abnormal findings of blood chemistry: Secondary | ICD-10-CM

## 2015-04-13 DIAGNOSIS — R945 Abnormal results of liver function studies: Secondary | ICD-10-CM

## 2015-04-13 MED ORDER — GADOBENATE DIMEGLUMINE 529 MG/ML IV SOLN
20.0000 mL | Freq: Once | INTRAVENOUS | Status: AC | PRN
  Administered 2015-04-13: 16 mL via INTRAVENOUS

## 2015-04-19 ENCOUNTER — Other Ambulatory Visit: Payer: Self-pay

## 2015-04-19 DIAGNOSIS — R945 Abnormal results of liver function studies: Secondary | ICD-10-CM

## 2015-04-25 ENCOUNTER — Ambulatory Visit (HOSPITAL_COMMUNITY): Payer: 59

## 2015-04-29 DIAGNOSIS — F509 Eating disorder, unspecified: Secondary | ICD-10-CM | POA: Diagnosis not present

## 2015-05-11 DIAGNOSIS — Z9884 Bariatric surgery status: Secondary | ICD-10-CM | POA: Diagnosis not present

## 2015-06-01 ENCOUNTER — Ambulatory Visit: Payer: 59 | Admitting: Family Medicine

## 2015-06-02 MED FILL — PANTOPRAZOLE SOD DR 40 MG T: 40 | 90 days supply | Qty: 90 | Fill #1

## 2015-06-02 MED FILL — rOPINIRole HCL 0.25 MG TABS: 0.25 | 90 days supply | Qty: 270 | Fill #1

## 2015-06-08 ENCOUNTER — Ambulatory Visit (INDEPENDENT_AMBULATORY_CARE_PROVIDER_SITE_OTHER): Payer: 59 | Admitting: Family Medicine

## 2015-06-08 ENCOUNTER — Encounter: Payer: Self-pay | Admitting: Family Medicine

## 2015-06-08 VITALS — BP 122/72 | HR 63 | Ht 63.0 in | Wt 170.0 lb

## 2015-06-08 DIAGNOSIS — M222X2 Patellofemoral disorders, left knee: Secondary | ICD-10-CM

## 2015-06-08 DIAGNOSIS — G5702 Lesion of sciatic nerve, left lower limb: Secondary | ICD-10-CM

## 2015-06-08 MED ORDER — TIZANIDINE HCL 4 MG PO TABS: 4.0000 mg | ORAL_TABLET | Freq: Every evening | ORAL | Status: DC

## 2015-06-08 MED FILL — tiZANidine HCL 4 MG TABS: 4 | 30 days supply | Qty: 30 | Fill #0

## 2015-06-08 NOTE — Progress Notes (Signed)
Pre visit review using our clinic review tool, if applicable. No additional management support is needed unless otherwise documented below in the visit note. 

## 2015-06-08 NOTE — Assessment & Plan Note (Signed)
Moderate nature. We will continue to monitor. Do not feel any swelling noted today. Likely secondary to patient's weakness of the hip abductors. Patient will start on exercises more regularly. We'll do an icing protocol. Follow-up in 3-4 weeks. If worsening symptoms possible injection or formal physical therapy. Patient has had a eversion to injections previously.

## 2015-06-08 NOTE — Patient Instructions (Signed)
Good to see you  Yo ulook great and keep it up Exercises 3 times a week.  Tennis ball in back left pocket with sitting Zanaflex at night for next 3 nights then as needed to help with muscle spasm  I love the shoes Ice 20 minutes 2 times daily. Usually after activity and before bed. Joint juice is a glucosamine liquid, looking for 1500mg  daily  See me againin 3-4 weeks to make sure you are doing well.

## 2015-06-08 NOTE — Assessment & Plan Note (Signed)
Piriformis Syndrome  Using an anatomical model, reviewed with the patient the structures involved and how they related to diagnosis. The patient indicated understanding.   The patient was given a handout from Dr. Arne Cleveland book "The Sports Medicine Patient Advisor" describing the anatomy and rehabilitation of the following condition: Piriformis Syndrome  Also given a handout with more extensive Piriformis stretching, hip flexor and abductor strengthening, ham stretching  Rec deep massage, explained self-massage with ball Discussed many massage techniques as well as the importance of hip abductor strengthening. Discussed proper shoes. Patient will try muscle relaxer that was prescribed today. Follow-up in 3-4 weeks.

## 2015-06-08 NOTE — Progress Notes (Signed)
[name] [place], [place] [address] Phone: [phone] Subjective:     CC: leg pain and left hip pain  [id number] Suzanne Schaefer is a 55 y.o. female coming in with complaint of left leg and hip pain.patient does have known patellofemoral syndrome. Patient states that the left knee seems to be very lateral. An states that it's almost always associated with more of a posterior hip pain. States that this seems to be worse on and off. Has had a history of a herniated disc. Patient states that she is having very mild low back pain. Seems to be much more localized into the left buttocks. Patient states that he can be sore even at night. More of a throbbing sensation. Patient states when she is doing activities seems to loosen up and then afterwards it is worse. Sitting for long amount of time can be difficult. Rates the severity is 6 out of 10. Patient has lost a significant amount weight and once a continue to work out.     Past Medical History  Diagnosis Date  . Restless leg syndrome   . Tonsillitis     nothing recent  . Esophagitis   . Allergic rhinitis     uses OTC meds  . History of sinusitis     no current problems  . Obesity   . Obstructive sleep apnea     no cpap currently.  Marland Kitchen GERD (gastroesophageal reflux disease)   . Arthritis     arthritis -knees ,ankles  . Complication of anesthesia   . PONV (postoperative nausea and vomiting)    Past Surgical History  Procedure Laterality Date  . Cervical biopsy    . Laparoscopic cholecystectomy  1992  . Bilat salpingectomy    . Cesarean section    . Tubal ligation  1988 1992  . Laparoscopic gastric sleeve resection N/A 04/05/2014    Procedure: LAPAROSCOPIC GASTRIC SLEEVE RESECTION with hiatel hernia repair;  Surgeon: Pedro Earls, MD;  Location: [facility];  Service: General;  Laterality: N/A;  . Upper gi endoscopy  04/05/2014    Procedure: UPPER GI ENDOSCOPY;  Surgeon: Pedro Earls,  MD;  Location: [facility];  Service: General;;   Social History   Social History  . Marital Status: Divorced    Spouse Name: N/A  . Number of Children: 2  . Years of Education: N/A   Social History Main Topics  . Smoking status: Never Smoker   . Smokeless tobacco: Never Used  . Alcohol Use: 0.0 oz/week    0 Standard drinks or equivalent per week     Comment: rare occasional use  . Drug Use: No  . Sexual Activity:    Partners: Male   Other Topics Concern  . None   Social History Narrative   HSG. Married 50- divorce '89. 2 daughters '86, 88. Work Educational psychologist. Lives in own home daughters at home   Allergies  Allergen Reactions  . Ciprofloxacin Nausea And Vomiting  . Hydrocodone Nausea And Vomiting  . Clindamycin/Lincomycin Rash   Family History  Problem Relation Age of Onset  . Asthma Mother   . COPD Mother   . Cancer Mother 80    PERITONEAL   . Hypertension Father   . Obesity Father   . Glaucoma Father   . Cancer Maternal Uncle     >20 colon polyps  . Cancer Paternal Uncle     blood cancer  . Cancer Paternal  Grandfather     lymphoma  . Atrial fibrillation Father   . Allergies Mother     ? seasonal    Past medical history, social, surgical and family history all reviewed in electronic medical record.  No pertanent information unless stated regarding to the chief complaint.   Review of Systems: No headache, visual changes, nausea, vomiting, diarrhea, constipation, dizziness, abdominal pain, skin rash, fevers, chills, night sweats, weight loss, swollen lymph nodes, body aches, joint swelling, muscle aches, chest pain, shortness of breath, mood changes.   Objective Blood pressure 122/72, pulse 63, height 5\' 3"  (1.6 m), weight 170 lb (77.111 kg), SpO2 99 %.  General: No apparent distress alert and oriented x3 mood and affect normal, dressed appropriately.  HEENT: Pupils equal, extraocular movements intact  Respiratory: Patient's speak in full sentences  and does not appear short of breath  Cardiovascular: No lower extremity edema, non tender, no erythema  Skin: Warm dry intact with no signs of infection or rash on extremities or on axial skeleton.  Abdomen: Soft nontender  Neuro: Cranial nerves II through XII are intact, neurovascularly intact in all extremities with 2+ DTRs and 2+ pulses.  Lymph: No lymphadenopathy of posterior or anterior cervical chain or axillae bilaterally.  Gait normal with good balance and coordination.  MSK:  Non tender with full range of motion and good stability and symmetric strength and tone of shoulders, elbows, wrist, , and ankles bilaterally.  [id number] ROM IR: 15 Deg, ER: 35 Deg, Flexion: 100 Deg, Extension: 100 Deg, Abduction: 45 Deg, Adduction: 15 Deg Strength IR: 5/5, ER: 5/5, Flexion: 5/5, Extension: 5/5, Abduction: 4/5, Adduction: 3/5 Pelvic alignment unremarkable to inspection and palpation. Standing hip rotation and gait without trendelenburg sign / unsteadiness. Pain over the piriformis with a positive Faber No SI joint tenderness and normal minimal SI movement. Negative straight leg test Contralateral hip unremarkable  Knee:left Normal to inspection with no erythema or effusion or obvious bony abnormalities.mild lateral tracking noted Mild tenderness over the lateral patellar ROM full in flexion and extension and lower leg rotation. Ligaments with solid consistent endpoints including ACL, PCL, LCL, MCL. Negative Mcmurray's, Apley's, and Thessalonian tests. Non painful patellar compression. Patellar glide with mildcrepitus. Patellar and quadriceps tendons unremarkable. Hamstring and quadriceps strength is normal.  Contralateral knee very similar.  Procedure note [id number]; 15 minutes spent for Therapeutic exercises as stated in above notes.  This included exercises focusing on stretching, strengthening, with significant focus on eccentric aspects. Piriformis Syndrome  Using an anatomical model,  reviewed with the patient the structures involved and how they related to diagnosis. The patient indicated understanding.   The patient was given a handout from Dr. Arne Cleveland book "The Sports Medicine Patient Advisor" describing the anatomy and rehabilitation of the following condition: Piriformis Syndrome  Also given a handout with more extensive Piriformis stretching, hip flexor and abductor strengthening, ham stretching  Rec deep massage, explained self-massage with ball    Proper technique shown and discussed handout in great detail with ATC.  All questions were discussed and answered.     Impression and Recommendations:     This case required medical decision making of moderate complexity.      Note: This dictation was prepared with Dragon dictation along with smaller phrase technology. Any transcriptional errors that result from this process are unintentional.

## 2015-06-26 ENCOUNTER — Encounter: Payer: Self-pay | Admitting: Physician Assistant

## 2015-07-11 ENCOUNTER — Encounter: Payer: 59 | Attending: Surgery | Admitting: Dietician

## 2015-07-11 ENCOUNTER — Encounter: Payer: Self-pay | Admitting: Dietician

## 2015-07-11 DIAGNOSIS — Z9884 Bariatric surgery status: Secondary | ICD-10-CM | POA: Diagnosis present

## 2015-07-11 DIAGNOSIS — Z09 Encounter for follow-up examination after completed treatment for conditions other than malignant neoplasm: Secondary | ICD-10-CM | POA: Insufficient documentation

## 2015-07-11 NOTE — Progress Notes (Signed)
  Follow-up visit:  15 Months Post-Operative Sleeve Gastrectomy Surgery  Medical Nutrition Therapy:  Appt start time: 825 end time:  900  Primary concerns today: Post-operative Bariatric Surgery Nutrition Management. Suzanne Schaefer returns having maintained her weight. She states that she would like to lose another 20 pounds. However, she still plans to have a tummy tuck and realizes that she will lose several pounds of skin. She hopes to have the procedure done after maintaining her weight loss for a year Continues to work out at least 3x a week with [employer]. She followed up with psych in April 2017. Suzanne Schaefer continues to step on the scale daily and states that she uses this information to help her maintain her weight. She continues to meet protein needs daily and keeps high protein snacks on hand at work.   Surgery date: 04/05/2014 Surgery type: Gastric sleeve Start weight at [facility]: 269 lbs on 01/08/2014 Weight today: 168.8 lbs Weight change: 1.2 lbs  Total weight loss: 100.2 lbs  TANITA BODY COMP RESULTS  03/14/14 04/19/14 05/31/14 06/27/14 09/27/14 12/12/14 01/03/15 04/05/15 07/11/15  BMI (kg/m^2) 47.7 43.0 40.7 38.8 34.9 33.0 32.7 30.1 29.9  Fat Mass (lbs) 134.5 123.5 98.5 99.5 79.0 63.0 71.0 57.5 56.8  Fat Free Mass (lbs) 135 119.0 131.5 119.5 118.0 123.5 113.5 112.5 112  Total Body Water (lbs) 99 87.0 96.5 87.5 86.5 90.5 83.0 82.5 78.8                  Preferred Learning Style:   No preference indicated   Learning Readiness:   Ready  24-hr recall: B (6 AM): Premier Protein or [employer] protein during the week and eggs and bacon on weekends (25-30 g) Snk (AM): sometimes L (PM): 3-4 oz chicken OR yogurt and nuts (15-17g) Snk (PM): sometimes yogurt or nuts D (PM): sliced Hormel [place] with gravy or chili (14g) Snk (PM): SF popscile  Fluid intake: 16 oz of protein shake, 36-48 oz water (~64 oz) Estimated total protein intake: 71-85  g  Medications:  See list  Supplementation: Taking and added 2000 IU of vitamin D 3x a week  Using straws: sometimes if driving, not often Drinking while eating: No Hair loss: noticing some regrowth Carbonated beverages: No N/V/D/C:  taking miralax daily or every other day to keep her regular Dumping syndrome: No  Recent physical activity:  [employer] program MWF, yardwork and other activities occasionally (walking and kayaking)  Progress Towards Goal(s):  In progress.   Nutritional Diagnosis:  [place]-3.3 Overweight/obesity related to past poor dietary habits and physical inactivity as evidenced by patient w/ recent sleeve gastrectomy surgery following dietary guidelines for continued weight loss.    Intervention:  Nutrition education/diet reinforcement Goals:  Follow Phase 3B: High Protein + Non-Starchy Vegetables  Eat 3-6 small meals/snacks, every 3-5 hrs  Increase lean protein foods to meet 60g goal  Increase fluid intake to 64oz +  Avoid drinking 15 minutes before, during and 30 minutes after eating  Aim for >30 min of physical activity daily - Look into H[employer] or another option for after BELT is complete  Add vegetables to lunch and dinner  Continue having snacks only if you are hungry   Teaching Method Utilized:  Visual Auditory Hands on  Barriers to learning/adherence to lifestyle change: none  Demonstrated degree of understanding via:  Teach Back   Monitoring/Evaluation:  Dietary intake, exercise, and body weight. Follow up in 9 months for 2 year post-op visit.

## 2015-07-11 NOTE — Patient Instructions (Addendum)
Goals:  Follow Phase 3B: High Protein + Non-Starchy Vegetables  Eat 3-6 small meals/snacks, every 3-5 hrs  Increase lean protein foods to meet 60g goal  Increase fluid intake to 64oz +  Avoid drinking 15 minutes before, during and 30 minutes after eating  Aim for >30 min of physical activity daily  Add vegetables to lunch and dinner  Continue having snacks only if you are hungry  Surgery date: 04/05/2014 Surgery type: Gastric sleeve [facility] weight at [facility]: 269 lbs on 01/08/2014 Weight today: 168.8 lbs Weight change: 1.2 lbs  Total weight loss: 100.2 lbs  TANITA BODY COMP RESULTS  03/14/14 04/19/14 05/31/14 06/27/14 09/27/14 12/12/14 01/03/15 04/05/15 07/11/15  BMI (kg/m^2) 47.7 43.0 40.7 38.8 34.9 33.0 32.7 30.1 29.9  Fat Mass (lbs) 134.5 123.5 98.5 99.5 79.0 63.0 71.0 57.5 56.8  Fat Free Mass (lbs) 135 119.0 131.5 119.5 118.0 123.5 113.5 112.5 112  Total Body Water (lbs) 99 87.0 96.5 87.5 86.5 90.5 83.0 82.5 78.8

## 2015-08-15 ENCOUNTER — Ambulatory Visit (INDEPENDENT_AMBULATORY_CARE_PROVIDER_SITE_OTHER): Payer: 59 | Admitting: Internal Medicine

## 2015-08-15 ENCOUNTER — Encounter: Payer: Self-pay | Admitting: Internal Medicine

## 2015-08-15 VITALS — BP 104/68 | HR 70 | Wt 168.0 lb

## 2015-08-15 DIAGNOSIS — R7401 Elevation of levels of liver transaminase levels: Secondary | ICD-10-CM

## 2015-08-15 DIAGNOSIS — R740 Nonspecific elevation of levels of transaminase and lactic acid dehydrogenase [LDH]: Secondary | ICD-10-CM

## 2015-08-15 DIAGNOSIS — E663 Overweight: Secondary | ICD-10-CM | POA: Diagnosis not present

## 2015-08-15 DIAGNOSIS — K8500 Idiopathic acute pancreatitis without necrosis or infection: Secondary | ICD-10-CM

## 2015-08-15 NOTE — Addendum Note (Signed)
Addended by: Cresenciano Lick on: 08/15/2015 09:14 AM   Modules accepted: Orders

## 2015-08-15 NOTE — Progress Notes (Signed)
Pre visit review using our clinic review tool, if applicable. No additional management support is needed unless otherwise documented below in the visit note. 

## 2015-08-15 NOTE — Assessment & Plan Note (Addendum)
Repeat LFTs Liver MRI _ WNL

## 2015-08-15 NOTE — Progress Notes (Signed)
Subjective:  Patient ID: Suzanne Schaefer, female    DOB: [birth year]  Age: 55 y.o. MRN: [id number]  CC: No chief complaint on file.   HPI Suzanne Schaefer presents for insomnia, GERD, pancreatitis, LBP, wt loss f/u. Doing well  Outpatient Prescriptions Prior to Visit  Medication Sig Dispense Refill  . ALREX 0.2 % SUSP Place 1 drop into the left eye 2 (two) times daily as needed (irritation).   1  . Cetirizine-Pseudoephedrine (ZYRTEC-D PO) Take 1 tablet by mouth every morning.     . Cholecalciferol (VITAMIN D-3) 1000 UNITS CAPS Take 2 capsules by mouth 3 (three) times a week.     . diphenhydramine-acetaminophen (TYLENOL PM) 25-500 MG TABS Take 1 tablet by mouth at bedtime as needed (aches and sleep.).     Marland Kitchen eszopiclone (LUNESTA) 2 MG TABS tablet Take 1 tablet (2 mg total) by mouth at bedtime as needed for sleep. Take immediately before bedtime (Patient taking differently: Take 2 mg by mouth at bedtime as needed for sleep. Takes mainly on Friday and Saturday night.) 30 tablet 2  . fluticasone (FLONASE) 50 MCG/ACT nasal spray Place 2 sprays into both nostrils at bedtime.    . magnesium oxide (MAG-OX) 400 MG tablet Take 400 mg by mouth 3 (three) times a week. Mon,Wed,Fri    . Multiple Vitamins-Minerals (CENTRUM VITAMINTS PO) Take 1 each by mouth daily.    . pantoprazole (PROTONIX) 40 MG tablet Take 1 tablet (40 mg total) by mouth daily. (Patient taking differently: Take 40 mg by mouth 3 (three) times a week. Monday,Wednesdays, and Fridays) 90 tablet 3  . Polyethylene Glycol 3350 (MIRALAX PO) Take 1 scoop by mouth at bedtime.     Marland Kitchen rOPINIRole (REQUIP) 0.25 MG tablet Take 3 tablets (0.75 mg total) by mouth at bedtime. 270 tablet 3  . tiZANidine (ZANAFLEX) 4 MG tablet Take 1 tablet (4 mg total) by mouth Nightly. 30 tablet 2  . Diclofenac Sodium 2 % SOLN Apply 1 pump twice daily. (Patient not taking: Reported on 08/15/2015) 112 g 3   No facility-administered medications prior to visit.    ROS Review  of Systems  Constitutional: Negative for chills, activity change, appetite change, fatigue and unexpected weight change.  HENT: Negative for congestion, mouth sores and sinus pressure.   Eyes: Negative for visual disturbance.  Respiratory: Negative for cough and chest tightness.   Gastrointestinal: Negative for nausea and abdominal pain.  Genitourinary: Negative for frequency, difficulty urinating and vaginal pain.  Musculoskeletal: Negative for back pain and gait problem.  Skin: Negative for pallor and rash.  Neurological: Negative for dizziness, tremors, weakness, numbness and headaches.  Psychiatric/Behavioral: Negative for suicidal ideas, confusion and sleep disturbance.    Objective:  BP 104/68 mmHg  Pulse 70  Wt 168 lb (76.204 kg)  SpO2 98%  BP Readings from Last 3 Encounters:  08/15/15 104/68  06/08/15 122/72  03/16/15 90/60    Wt Readings from Last 3 Encounters:  08/15/15 168 lb (76.204 kg)  07/11/15 168 lb 12.8 oz (76.567 kg)  06/08/15 170 lb (77.111 kg)    Physical Exam  Constitutional: She appears well-developed. No distress.  HENT:  Head: Normocephalic.  Right Ear: External ear normal.  Left Ear: External ear normal.  Nose: Nose normal.  Mouth/Throat: Oropharynx is clear and moist.  Eyes: Conjunctivae are normal. Pupils are equal, round, and reactive to light. Right eye exhibits no discharge. Left eye exhibits no discharge.  Neck: Normal range of motion. Neck supple. No  JVD present. No tracheal deviation present. No thyromegaly present.  Cardiovascular: Normal rate, regular rhythm and normal heart sounds.   Pulmonary/Chest: No stridor. No respiratory distress. She has no wheezes.  Abdominal: Soft. Bowel sounds are normal. She exhibits no distension and no mass. There is no tenderness. There is no rebound and no guarding.  Musculoskeletal: She exhibits no edema or tenderness.  Lymphadenopathy:    She has no cervical adenopathy.  Neurological: She displays  normal reflexes. No cranial nerve deficit. She exhibits normal muscle tone. Coordination normal.  Skin: No rash noted. No erythema.  Psychiatric: She has a normal mood and affect. Her behavior is normal. Judgment and thought content normal.    Lab Results  Component Value Date   WBC 4.7 03/21/2015   HGB 13.1 03/21/2015   HCT 38.5 03/21/2015   PLT 180.0 03/21/2015   GLUCOSE 101* 03/21/2015   CHOL 185 03/21/2015   TRIG 67.0 03/21/2015   HDL 78.30 03/21/2015   LDLDIRECT 115.2 01/27/2013   LDLCALC 93 03/21/2015   ALT 16 03/21/2015   AST 50* 03/21/2015   NA 140 03/21/2015   K 3.9 03/21/2015   CL 103 03/21/2015   CREATININE 0.73 03/21/2015   BUN 21 03/21/2015   CO2 27 03/21/2015   TSH 0.84 03/21/2015   INR 1.12 02/24/2015   HGBA1C 5.4 08/31/2014    Mr 3d Recon At Scanner  04/13/2015  CLINICAL DATA:  History of pancreatitis.  Elevated LFTs. EXAM: MRI ABDOMEN WITHOUT AND WITH CONTRAST (INCLUDING MRCP) TECHNIQUE: Multiplanar multisequence MR imaging of the abdomen was performed both before and after the administration of intravenous contrast. Heavily T2-weighted images of the biliary and pancreatic ducts were obtained, and three-dimensional MRCP images were rendered by post processing. CONTRAST:  3mL MULTIHANCE GADOBENATE DIMEGLUMINE 529 MG/ML IV SOLN COMPARISON:  None. FINDINGS: Lower chest: No pleural effusion identified. The lung bases are clear. Hepatobiliary: No focal liver abnormality. Previous cholecystectomy. The common bile duct measures up to 6 mm. No obstructing stone or mass noted. Pancreas: The pancreas is normal. No inflammation or mass identified. Spleen: Normal in size. Adrenals/Urinary Tract: The adrenal glands are unremarkable. Normal appearance of the kidneys. Stomach/Bowel: The stomach is normal. The visualized upper abdominal bowel loops are unremarkable. No dilated loops of bowel identified. Vascular/Lymphatic: Normal appearance of the abdominal aorta. No upper abdominal  adenopathy identified. Other: Negative Musculoskeletal: No abnormal signal identified from within the bone marrow. IMPRESSION: 1. No acute findings identified within the upper abdomen. 2. Previous cholecystectomy. No significant biliary dilatation. No choledocholithiasis Electronically Signed   By: Kerby Moors M.D.   On: 04/13/2015 10:09   Mr Abd W/wo Cm/mrcp  04/13/2015  CLINICAL DATA:  History of pancreatitis.  Elevated LFTs. EXAM: MRI ABDOMEN WITHOUT AND WITH CONTRAST (INCLUDING MRCP) TECHNIQUE: Multiplanar multisequence MR imaging of the abdomen was performed both before and after the administration of intravenous contrast. Heavily T2-weighted images of the biliary and pancreatic ducts were obtained, and three-dimensional MRCP images were rendered by post processing. CONTRAST:  68mL MULTIHANCE GADOBENATE DIMEGLUMINE 529 MG/ML IV SOLN COMPARISON:  None. FINDINGS: Lower chest: No pleural effusion identified. The lung bases are clear. Hepatobiliary: No focal liver abnormality. Previous cholecystectomy. The common bile duct measures up to 6 mm. No obstructing stone or mass noted. Pancreas: The pancreas is normal. No inflammation or mass identified. Spleen: Normal in size. Adrenals/Urinary Tract: The adrenal glands are unremarkable. Normal appearance of the kidneys. Stomach/Bowel: The stomach is normal. The visualized upper abdominal bowel loops are  unremarkable. No dilated loops of bowel identified. Vascular/Lymphatic: Normal appearance of the abdominal aorta. No upper abdominal adenopathy identified. Other: Negative Musculoskeletal: No abnormal signal identified from within the bone marrow. IMPRESSION: 1. No acute findings identified within the upper abdomen. 2. Previous cholecystectomy. No significant biliary dilatation. No choledocholithiasis Electronically Signed   By: Kerby Moors M.D.   On: 04/13/2015 10:09    Assessment & Plan:   There are no diagnoses linked to this encounter. I am having Ms.  Shirk maintain her Polyethylene Glycol 3350 (MIRALAX PO), Cetirizine-Pseudoephedrine (ZYRTEC-D PO), eszopiclone, diphenhydramine-acetaminophen, Vitamin D-3, fluticasone, Diclofenac Sodium, rOPINIRole, pantoprazole, Multiple Vitamins-Minerals (CENTRUM VITAMINTS PO), ALREX, magnesium oxide, and tiZANidine.  No orders of the defined types were placed in this encounter.     Follow-up: No Follow-up on file.  Turnage Kehr, MD

## 2015-08-15 NOTE — Assessment & Plan Note (Signed)
S/p gastric sleeve  Weight: 168 lb (76.204 kg)  Wt Readings from Last 3 Encounters:  08/15/15 168 lb (76.204 kg)  07/11/15 168 lb 12.8 oz (76.567 kg)  06/08/15 170 lb (77.111 kg)

## 2015-08-15 NOTE — Assessment & Plan Note (Signed)
No relapse 

## 2015-08-17 ENCOUNTER — Other Ambulatory Visit (INDEPENDENT_AMBULATORY_CARE_PROVIDER_SITE_OTHER): Payer: 59

## 2015-08-17 DIAGNOSIS — R740 Nonspecific elevation of levels of transaminase and lactic acid dehydrogenase [LDH]: Secondary | ICD-10-CM

## 2015-08-17 DIAGNOSIS — R7401 Elevation of levels of liver transaminase levels: Secondary | ICD-10-CM

## 2015-08-17 LAB — HEPATIC FUNCTION PANEL
ALBUMIN: 4 g/dL (ref 3.5–5.2)
ALK PHOS: 119 U/L — AB (ref 39–117)
ALT: 18 U/L (ref 0–35)
AST: 48 U/L — AB (ref 0–37)
Bilirubin, Direct: 0.1 mg/dL (ref 0.0–0.3)
TOTAL PROTEIN: 7 g/dL (ref 6.0–8.3)
Total Bilirubin: 0.5 mg/dL (ref 0.2–1.2)

## 2015-08-31 MED FILL — PANTOPRAZOLE SOD DR 40 MG T: 40 | 90 days supply | Qty: 90 | Fill #2

## 2015-08-31 MED FILL — tiZANidine HCL 4 MG TABS: 4 | 30 days supply | Qty: 30 | Fill #1

## 2015-08-31 MED FILL — rOPINIRole HCL 0.25 MG TABS: 0.25 | 90 days supply | Qty: 270 | Fill #2

## 2015-11-02 DIAGNOSIS — Z9884 Bariatric surgery status: Secondary | ICD-10-CM | POA: Diagnosis not present

## 2015-11-13 ENCOUNTER — Encounter: Payer: Self-pay | Admitting: Physician Assistant

## 2015-11-14 ENCOUNTER — Encounter: Payer: Self-pay | Admitting: Internal Medicine

## 2015-11-29 DIAGNOSIS — Z1231 Encounter for screening mammogram for malignant neoplasm of breast: Secondary | ICD-10-CM | POA: Diagnosis not present

## 2015-11-29 LAB — HM MAMMOGRAPHY

## 2015-11-30 ENCOUNTER — Encounter: Payer: Self-pay | Admitting: Internal Medicine

## 2015-12-01 ENCOUNTER — Ambulatory Visit (AMBULATORY_SURGERY_CENTER): Payer: Self-pay | Admitting: *Deleted

## 2015-12-01 VITALS — Ht 63.0 in | Wt 174.0 lb

## 2015-12-01 DIAGNOSIS — Z1211 Encounter for screening for malignant neoplasm of colon: Secondary | ICD-10-CM

## 2015-12-01 NOTE — Progress Notes (Signed)
No egg or soy allergy known to patient  No issues with past sedation with any surgeries  or procedures, no intubation problems  No diet pills per patient No home 02 use per patient  No blood thinners per patient  Pt denies issues with constipation  No A fib or A flutter   emmi declined'   

## 2015-12-04 MED FILL — rOPINIRole HCL 0.25 MG TABS: 0.25 | 90 days supply | Qty: 270 | Fill #3

## 2015-12-04 MED FILL — PANTOPRAZOLE SOD DR 40 MG T: 40 | 90 days supply | Qty: 90 | Fill #3

## 2015-12-21 MED FILL — tiZANidine HCL 4 MG TABS: 4 | 30 days supply | Qty: 30 | Fill #2

## 2016-01-04 ENCOUNTER — Encounter: Payer: Self-pay | Admitting: Internal Medicine

## 2016-01-04 ENCOUNTER — Ambulatory Visit (AMBULATORY_SURGERY_CENTER): Payer: 59 | Admitting: Internal Medicine

## 2016-01-04 VITALS — BP 103/60 | HR 62 | Temp 98.9°F | Resp 15 | Ht 63.0 in | Wt 174.0 lb

## 2016-01-04 DIAGNOSIS — Z1212 Encounter for screening for malignant neoplasm of rectum: Secondary | ICD-10-CM | POA: Diagnosis not present

## 2016-01-04 DIAGNOSIS — Z1211 Encounter for screening for malignant neoplasm of colon: Secondary | ICD-10-CM | POA: Diagnosis present

## 2016-01-04 DIAGNOSIS — G4733 Obstructive sleep apnea (adult) (pediatric): Secondary | ICD-10-CM | POA: Diagnosis not present

## 2016-01-04 DIAGNOSIS — K5730 Diverticulosis of large intestine without perforation or abscess without bleeding: Secondary | ICD-10-CM | POA: Diagnosis not present

## 2016-01-04 MED ORDER — SODIUM CHLORIDE 0.9 % IV SOLN: 500.0000 mL | INTRAVENOUS | Status: DC

## 2016-01-04 NOTE — Patient Instructions (Addendum)
No polyps today. You do have diverticulosis - thickened muscle rings and pouches in the colon wall. Please read the handout about this condition.  Next routine colonoscopy in 10 years - 2027  I appreciate the opportunity to care for you. Gatha Mayer, MD, FACG   YOU HAD AN ENDOSCOPIC PROCEDURE TODAY AT [facility]:   Refer to the procedure report that was given to you for any specific questions about what was found during the examination.  If the procedure report does not answer your questions, please call your gastroenterologist to clarify.  If you requested that your care partner not be given the details of your procedure findings, then the procedure report has been included in a sealed envelope for you to review at your convenience later.  YOU SHOULD EXPECT: Some feelings of bloating in the abdomen. Passage of more gas than usual.  Walking can help get rid of the air that was put into your GI tract during the procedure and reduce the bloating. If you had a lower endoscopy (such as a colonoscopy or flexible sigmoidoscopy) you may notice spotting of blood in your stool or on the toilet paper. If you underwent a bowel prep for your procedure, you may not have a normal bowel movement for a few days.  Please Note:  You might notice some irritation and congestion in your nose or some drainage.  This is from the oxygen used during your procedure.  There is no need for concern and it should clear up in a day or so.  SYMPTOMS TO REPORT IMMEDIATELY:   Following lower endoscopy (colonoscopy or flexible sigmoidoscopy):  Excessive amounts of blood in the stool  Significant tenderness or worsening of abdominal pains  Swelling of the abdomen that is new, acute  Fever of 100F or higher    For urgent or emergent issues, a gastroenterologist can be reached at any hour by calling [phone].   DIET:  We do recommend a small meal at first, but then you may proceed to  your regular diet.  Drink plenty of fluids but you should avoid alcoholic beverages for 24 hours.  ACTIVITY:  You should plan to take it easy for the rest of today and you should NOT DRIVE or use heavy machinery until tomorrow (because of the sedation medicines used during the test).    FOLLOW UP: Our staff will call the number listed on your records the next business day following your procedure to check on you and address any questions or concerns that you may have regarding the information given to you following your procedure. If we do not reach you, we will leave a message.  However, if you are feeling well and you are not experiencing any problems, there is no need to return our call.  We will assume that you have returned to your regular daily activities without incident.  If any biopsies were taken you will be contacted by phone or by letter within the next 1-3 weeks.  Please call us at [phone] if you have not heard about the biopsies in 3 weeks.    SIGNATURES/CONFIDENTIALITY: You and/or your care partner have signed paperwork which will be entered into your electronic medical record.  These signatures attest to the fact that that the information above on your After Visit Summary has been reviewed and is understood.  Full responsibility of the confidentiality of this discharge information lies with you and/or your care-partner.   Information on diverticulosis  given to you today.

## 2016-01-04 NOTE — Op Note (Signed)
[facility] Patient Name: Suzanne Schaefer Procedure Date: 01/04/2016 2:08 PM MRN: [id number] Endoscopist: Gatha Mayer , MD Age: 55 Referring MD:  Date of Birth: September 14, 1960 Gender: Female Account #: [id number] Procedure:                Colonoscopy Indications:              Screening for colorectal malignant neoplasm Medicines:                Propofol per Anesthesia, Monitored Anesthesia Care Procedure:                Pre-Anesthesia Assessment:                           - Prior to the procedure, a History and Physical                            was performed, and patient medications and                            allergies were reviewed. The patient's tolerance of                            previous anesthesia was also reviewed. The risks                            and benefits of the procedure and the sedation                            options and risks were discussed with the patient.                            All questions were answered, and informed consent                            was obtained. Prior Anticoagulants: The patient has                            taken no previous anticoagulant or antiplatelet                            agents. ASA Grade Assessment: III - A patient with                            severe systemic disease. After reviewing the risks                            and benefits, the patient was deemed in                            satisfactory condition to undergo the procedure.                           After obtaining informed consent, the colonoscope  was passed under direct vision. Throughout the                            procedure, the patient's blood pressure, pulse, and                            oxygen saturations were monitored continuously. The                            Model PCF-[id number]) scope was introduced                            through the anus and advanced to the the cecum,            identified by appendiceal orifice and ileocecal                            valve. The colonoscopy was performed without                            difficulty. The patient tolerated the procedure                            well. The quality of the bowel preparation was                            adequate. The bowel preparation used was Miralax.                            The ileocecal valve, appendiceal orifice, and                            rectum were photographed. Scope In: 2:23:19 PM Scope Out: 2:42:04 PM Scope Withdrawal Time: 0 hours 15 minutes 14 seconds  Total Procedure Duration: 0 hours 18 minutes 45 seconds  Findings:                 The perianal and digital rectal examinations were                            normal.                           Multiple small and large-mouthed diverticula were                            found in the sigmoid colon.                           The exam was otherwise without abnormality on                            direct and retroflexion views. Complications:            No immediate complications. Estimated Blood Loss:     Estimated blood loss: none. Impression:               -  Diverticulosis in the sigmoid colon.                           - The examination was otherwise normal on direct                            and retroflexion views.                           - No specimens collected. Recommendation:           - Patient has a contact number available for                            emergencies. The signs and symptoms of potential                            delayed complications were discussed with the                            patient. Return to normal activities tomorrow.                            Written discharge instructions were provided to the                            patient.                           - Resume previous diet.                           - Continue present medications.                           - Repeat  colonoscopy in 10 years for screening                            purposes. Gatha Mayer, MD 01/04/2016 2:49:15 PM This report has been signed electronically.

## 2016-01-04 NOTE — Progress Notes (Signed)
To PACU, vss patent aw report to rn 

## 2016-01-05 ENCOUNTER — Telehealth: Payer: Self-pay

## 2016-01-05 NOTE — Telephone Encounter (Signed)
  Follow up Call-  Call back number 01/04/2016  Post procedure Call Back phone  # [phone] work  Permission to leave phone message Yes  Some recent data might be hidden    Patient was called for follow up after her procedure on 01/04/2016. No answer at the number given for follow up phone call. A message was left on the answering machine.

## 2016-01-05 NOTE — Telephone Encounter (Signed)
  Follow up Call-  Call back number 01/04/2016  Post procedure Call Back phone  # [phone] work  Permission to leave phone message Yes  Some recent data might be hidden    Patient was called for follow up after her procedure on 01/04/2016. No answer at the number given for follow up phone call. A message was left on the answering machine.

## 2016-01-26 DIAGNOSIS — H524 Presbyopia: Secondary | ICD-10-CM | POA: Diagnosis not present

## 2016-02-09 ENCOUNTER — Encounter: Payer: Self-pay | Admitting: Gynecology

## 2016-02-09 ENCOUNTER — Ambulatory Visit (INDEPENDENT_AMBULATORY_CARE_PROVIDER_SITE_OTHER): Payer: 59 | Admitting: Gynecology

## 2016-02-09 VITALS — BP 120/70 | Ht 62.0 in | Wt 178.6 lb

## 2016-02-09 DIAGNOSIS — N952 Postmenopausal atrophic vaginitis: Secondary | ICD-10-CM

## 2016-02-09 DIAGNOSIS — Z01411 Encounter for gynecological examination (general) (routine) with abnormal findings: Secondary | ICD-10-CM | POA: Diagnosis not present

## 2016-02-09 MED ORDER — ESTRADIOL 10 MCG VA TABS: 1.0000 | ORAL_TABLET | VAGINAL | 11 refills | Status: DC

## 2016-02-09 MED FILL — YUVAFEM 10 MCG VAGINAL INSE: 10 | 28 days supply | Qty: 8 | Fill #0

## 2016-02-09 NOTE — Progress Notes (Signed)
Suzanne Schaefer [birth year] [id number]   History:    56 y.o.  for annual gyn exam with a complaint of vaginal dryness irritation and tingling sensation. Patient is not sexually active. She is on no hormone replacement therapy. Her PCP is Dr. Alain Marion who is been doing her blood work and her vaccines are up-to-date.  Review of patient's medical history as follows: Patient with past history of vulvar condyloma. 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  Patient has also had in the past molluscum contagiosum in 2013  In 2011 patient had a negative GC and chlamydia culture as well as negative HIV and RPR   History of hydrosalpinx post fimbriectomy 1998 as well as total salpingectomy 1996. Patient with past history of high-grade CIN . LEEP cervical conization several years ago  Left breast needle core biopsy 2012 benign fibroadenoma mammogram 2017 normal. Normal bone density study in 2017. Normal colonoscopy 2016.  In March 2016 Dr. Rowe Clack margins performed a laparoscopic sleeve gastrectomy an upper endoscopy as well as repair of hiatal hernia and she has done well and has lost significant amount of weight.     Past medical history,surgical history, family history and social history were all reviewed and documented in the EPIC chart.  Gynecologic History No LMP recorded. Patient is not currently having periods (Reason: Perimenopausal). Contraception: post menopausal status Last Pap: 2017. Results were: normal Last mammogram: 2017. Results were: normal  Obstetric History OB History  Gravida Para Term Preterm AB Living  2 2       2   SAB TAB Ectopic Multiple Live Births               # Outcome Date GA Lbr Len/2nd Weight Sex Delivery Anes PTL Lv  2 Para           1 Para                 ROS: A ROS was performed and pertinent positives and negatives are included in the history.  GENERAL: No fevers or chills. HEENT: No change in vision, no earache, sore throat or sinus congestion. NECK: No pain or stiffness. CARDIOVASCULAR: No chest pain or pressure. No palpitations. PULMONARY: No shortness of breath, cough or wheeze. GASTROINTESTINAL: No abdominal pain, nausea, vomiting or diarrhea, melena or bright red blood per rectum. GENITOURINARY: No urinary frequency, urgency, hesitancy or dysuria. MUSCULOSKELETAL: No joint or muscle pain, no back pain, no recent trauma. DERMATOLOGIC: No rash, no itching, no lesions. ENDOCRINE: No polyuria, polydipsia, no heat or cold intolerance. No recent change in weight. HEMATOLOGICAL: No anemia or easy bruising or bleeding. NEUROLOGIC: No headache, seizures, numbness, tingling or weakness. PSYCHIATRIC: No depression, no loss of interest in normal activity or change in sleep pattern.     Exam: chaperone present  BP 120/70   Ht 5\' 2"  (1.575 m)   Wt 178 lb 9.6 oz (81 kg)   BMI 32.67 kg/m   Body mass index is 32.67 kg/m.  General appearance : Well developed well nourished female. No acute distress HEENT: Eyes: no retinal hemorrhage or exudates,  Neck supple, trachea midline, no carotid bruits, no thyroidmegaly Lungs: Clear to auscultation, no rhonchi or wheezes, or rib retractions  Heart: Regular rate and rhythm, no  murmurs or gallops Breast:Examined in sitting and supine position were symmetrical in appearance, no palpable masses or tenderness,  no skin retraction, no nipple inversion, no nipple discharge, no skin discoloration, no axillary or supraclavicular lymphadenopathy Abdomen: no palpable masses or tenderness, no rebound or guarding Extremities: no edema or skin discoloration or tenderness  Pelvic:  Bartholin, Urethra, Skene Glands: Within normal limits             Vagina: No gross lesions or discharge, atrophic  changes  Cervix: No gross lesions or discharge  Uterus  anteverted, normal size, shape and consistency, non-tender and mobile  Adnexa  Without masses or tenderness  Anus and perineum  normal   Rectovaginal  normal sphincter tone without palpated masses or tenderness             Hemoccult PCP provides     Assessment/Plan:  56 y.o. female for annual exam with past history of high-grade dysplasia several years ago resulting in LEEP cervical conization subsequent Pap smears of been normal. For this reason we are point to continue to do a Pap smear every year for close surveillance. For her vaginal atrophy we discussed treatment with Vagifem 10 g intravaginal twice a week. Risk benefits and pros and cons were discussed and patient resected like to proceed. She was reminded to schedule her mammogram for this year as well as her bone density study. She's also encouraged to take her calcium vitamin D and weightbearing exercises for osteoporosis prevention. Her PCP is been doing her blood work as well as her vaccines.   Terrance Mass MD, 10:19 AM 02/09/2016

## 2016-02-09 NOTE — Patient Instructions (Signed)
Bone Densitometry Introduction Bone densitometry is an imaging test that uses a special X-ray to measure the amount of calcium and other minerals in your bones (bone density). This test is also known as a bone mineral density test or dual-energy X-ray absorptiometry (DXA). The test can measure bone density at your hip and your spine. It is similar to having a regular X-ray. You may have this test to:  Diagnose a condition that causes weak or thin bones (osteoporosis).  Predict your risk of a broken bone (fracture).  Determine how well osteoporosis treatment is working. Tell a health care provider about:  Any allergies you have.  All medicines you are taking, including vitamins, herbs, eye drops, creams, and over-the-counter medicines.  Any problems you or family members have had with anesthetic medicines.  Any blood disorders you have.  Any surgeries you have had.  Any medical conditions you have.  Possibility of pregnancy.  Any other medical test you had within the previous 14 days that used contrast material. What are the risks? Generally, this is a safe procedure. However, problems can occur and may include the following:  This test exposes you to a very small amount of radiation.  The risks of radiation exposure may be greater to unborn children. What happens before the procedure?  Do not take any calcium supplements for 24 hours before having the test. You can otherwise eat and drink what you usually do.  Take off all metal jewelry, eyeglasses, dental appliances, and any other metal objects. What happens during the procedure?  You may lie on an exam table. There will be an X-ray generator below you and an imaging device above you.  Other devices, such as boxes or braces, may be used to position your body properly for the scan.  You will need to lie still while the machine slowly scans your body.  The images will show up on a computer monitor. What happens after the  procedure? You may need more testing at a later time. This information is not intended to replace advice given to you by your health care provider. Make sure you discuss any questions you have with your health care provider. Document Released: 02/13/2004 Document Revised: 06/29/2015 Document Reviewed: 07/01/2013  2017 Elsevier  

## 2016-02-12 LAB — PAP IG W/ RFLX HPV ASCU

## 2016-02-16 ENCOUNTER — Ambulatory Visit (INDEPENDENT_AMBULATORY_CARE_PROVIDER_SITE_OTHER): Payer: 59 | Admitting: Internal Medicine

## 2016-02-16 ENCOUNTER — Encounter: Payer: Self-pay | Admitting: Internal Medicine

## 2016-02-16 VITALS — BP 120/80 | HR 64 | Ht 62.0 in | Wt 175.0 lb

## 2016-02-16 DIAGNOSIS — Z9884 Bariatric surgery status: Secondary | ICD-10-CM | POA: Diagnosis not present

## 2016-02-16 DIAGNOSIS — Z0000 Encounter for general adult medical examination without abnormal findings: Secondary | ICD-10-CM | POA: Diagnosis not present

## 2016-02-16 DIAGNOSIS — R252 Cramp and spasm: Secondary | ICD-10-CM

## 2016-02-16 MED ORDER — ROPINIROLE HCL 0.25 MG PO TABS: 0.7500 mg | ORAL_TABLET | Freq: Every day | ORAL | 3 refills | Status: DC

## 2016-02-16 MED ORDER — PANTOPRAZOLE SODIUM 40 MG PO TBEC: 40.0000 mg | DELAYED_RELEASE_TABLET | Freq: Every day | ORAL | 3 refills | Status: DC

## 2016-02-16 NOTE — Assessment & Plan Note (Signed)
Labs

## 2016-02-16 NOTE — Progress Notes (Signed)
Subjective:  Patient ID: Tana Coast, female    DOB: May 08, 1960  Age: 56 y.o. MRN: [id number]  CC: Annual Exam   HPI LEELYNN TWIDWELL presents for a well exam C/o cramps L>R at night  Outpatient Medications Prior to Visit  Medication Sig Dispense Refill  . ALREX 0.2 % SUSP Place 1 drop into the left eye 2 (two) times daily as needed (irritation).   1  . Calcium-Vitamin D-Vitamin K ([employer] CALCIUM SOFT CHEWS PO) Take 1,200 mg by mouth daily.    . Cetirizine-Pseudoephedrine (ZYRTEC-D PO) Take 1 tablet by mouth every morning.     . Cholecalciferol (VITAMIN D-3) 1000 UNITS CAPS Take 2 capsules by mouth 3 (three) times a week.     . Estradiol 10 MCG TABS vaginal tablet Place 1 tablet (10 mcg total) vaginally 2 (two) times a week. 8 tablet 11  . fluticasone (FLONASE) 50 MCG/ACT nasal spray Place 2 sprays into both nostrils at bedtime.    . magnesium oxide (MAG-OX) 400 MG tablet Take 400 mg by mouth 3 (three) times a week. Mon,Wed,Fri    . Multiple Vitamins-Minerals (CENTRUM VITAMINTS PO) Take 1 each by mouth 2 (two) times daily.     . pantoprazole (PROTONIX) 40 MG tablet Take 1 tablet (40 mg total) by mouth daily. (Patient taking differently: Take 40 mg by mouth 3 (three) times a week. Monday,Wednesdays, and Fridays) 90 tablet 3  . rOPINIRole (REQUIP) 0.25 MG tablet Take 3 tablets (0.75 mg total) by mouth at bedtime. 270 tablet 3   Facility-Administered Medications Prior to Visit  Medication Dose Route Frequency Provider Last Rate Last Dose  . 0.9 %  sodium chloride infusion  500 mL Intravenous Continuous Gatha Mayer, MD        ROS Review of Systems  Constitutional: Negative for activity change, appetite change, chills, fatigue and unexpected weight change.  HENT: Negative for congestion, mouth sores and sinus pressure.   Eyes: Negative for visual disturbance.  Respiratory: Negative for cough and chest tightness.   Gastrointestinal: Negative for abdominal pain and nausea.    Genitourinary: Negative for difficulty urinating, frequency and vaginal pain.  Musculoskeletal: Positive for myalgias. Negative for back pain and gait problem.  Skin: Negative for pallor and rash.  Neurological: Negative for dizziness, tremors, weakness, numbness and headaches.  Psychiatric/Behavioral: Negative for confusion and sleep disturbance.    Objective:  BP 120/80   Pulse 64   Ht 5\' 2"  (1.575 m)   Wt 175 lb (79.4 kg)   SpO2 97%   BMI 32.01 kg/m   BP Readings from Last 3 Encounters:  02/16/16 120/80  02/09/16 120/70  01/04/16 103/60    Wt Readings from Last 3 Encounters:  02/16/16 175 lb (79.4 kg)  02/09/16 178 lb 9.6 oz (81 kg)  01/04/16 174 lb (78.9 kg)    Physical Exam  Constitutional: She appears well-developed. No distress.  HENT:  Head: Normocephalic.  Right Ear: External ear normal.  Left Ear: External ear normal.  Nose: Nose normal.  Mouth/Throat: Oropharynx is clear and moist.  Eyes: Conjunctivae are normal. Pupils are equal, round, and reactive to light. Right eye exhibits no discharge. Left eye exhibits no discharge.  Neck: Normal range of motion. Neck supple. No JVD present. No tracheal deviation present. No thyromegaly present.  Cardiovascular: Normal rate, regular rhythm and normal heart sounds.   Pulmonary/Chest: No stridor. No respiratory distress. She has no wheezes.  Abdominal: Soft. Bowel sounds are normal. She exhibits no distension  and no mass. There is no tenderness. There is no rebound and no guarding.  Musculoskeletal: She exhibits no edema or tenderness.  Lymphadenopathy:    She has no cervical adenopathy.  Neurological: She displays normal reflexes. No cranial nerve deficit. She exhibits normal muscle tone. Coordination normal.  Skin: No rash noted. No erythema.  Psychiatric: She has a normal mood and affect. Her behavior is normal. Judgment and thought content normal.    Lab Results  Component Value Date   WBC 4.7 03/21/2015    HGB 13.1 03/21/2015   HCT 38.5 03/21/2015   PLT 180.0 03/21/2015   GLUCOSE 101 (H) 03/21/2015   CHOL 185 03/21/2015   TRIG 67.0 03/21/2015   HDL 78.30 03/21/2015   LDLDIRECT 115.2 01/27/2013   LDLCALC 93 03/21/2015   ALT 18 08/17/2015   AST 48 (H) 08/17/2015   NA 140 03/21/2015   K 3.9 03/21/2015   CL 103 03/21/2015   CREATININE 0.73 03/21/2015   BUN 21 03/21/2015   CO2 27 03/21/2015   TSH 0.84 03/21/2015   INR 1.12 02/24/2015   HGBA1C 5.4 08/31/2014    Mr 3d Recon At Scanner  Result Date: 04/13/2015 CLINICAL DATA:  History of pancreatitis.  Elevated LFTs. EXAM: MRI ABDOMEN WITHOUT AND WITH CONTRAST (INCLUDING MRCP) TECHNIQUE: Multiplanar multisequence MR imaging of the abdomen was performed both before and after the administration of intravenous contrast. Heavily T2-weighted images of the biliary and pancreatic ducts were obtained, and three-dimensional MRCP images were rendered by post processing. CONTRAST:  78mL MULTIHANCE GADOBENATE DIMEGLUMINE 529 MG/ML IV SOLN COMPARISON:  None. FINDINGS: Lower chest: No pleural effusion identified. The lung bases are clear. Hepatobiliary: No focal liver abnormality. Previous cholecystectomy. The common bile duct measures up to 6 mm. No obstructing stone or mass noted. Pancreas: The pancreas is normal. No inflammation or mass identified. Spleen: Normal in size. Adrenals/Urinary Tract: The adrenal glands are unremarkable. Normal appearance of the kidneys. Stomach/Bowel: The stomach is normal. The visualized upper abdominal bowel loops are unremarkable. No dilated loops of bowel identified. Vascular/Lymphatic: Normal appearance of the abdominal aorta. No upper abdominal adenopathy identified. Other: Negative Musculoskeletal: No abnormal signal identified from within the bone marrow. IMPRESSION: 1. No acute findings identified within the upper abdomen. 2. Previous cholecystectomy. No significant biliary dilatation. No choledocholithiasis Electronically  Signed   By: Kerby Moors M.D.   On: 04/13/2015 10:09   Mr Jeananne Rama W/wo Cm/mrcp  Result Date: 04/13/2015 CLINICAL DATA:  History of pancreatitis.  Elevated LFTs. EXAM: MRI ABDOMEN WITHOUT AND WITH CONTRAST (INCLUDING MRCP) TECHNIQUE: Multiplanar multisequence MR imaging of the abdomen was performed both before and after the administration of intravenous contrast. Heavily T2-weighted images of the biliary and pancreatic ducts were obtained, and three-dimensional MRCP images were rendered by post processing. CONTRAST:  20mL MULTIHANCE GADOBENATE DIMEGLUMINE 529 MG/ML IV SOLN COMPARISON:  None. FINDINGS: Lower chest: No pleural effusion identified. The lung bases are clear. Hepatobiliary: No focal liver abnormality. Previous cholecystectomy. The common bile duct measures up to 6 mm. No obstructing stone or mass noted. Pancreas: The pancreas is normal. No inflammation or mass identified. Spleen: Normal in size. Adrenals/Urinary Tract: The adrenal glands are unremarkable. Normal appearance of the kidneys. Stomach/Bowel: The stomach is normal. The visualized upper abdominal bowel loops are unremarkable. No dilated loops of bowel identified. Vascular/Lymphatic: Normal appearance of the abdominal aorta. No upper abdominal adenopathy identified. Other: Negative Musculoskeletal: No abnormal signal identified from within the bone marrow. IMPRESSION: 1. No acute findings identified within  the upper abdomen. 2. Previous cholecystectomy. No significant biliary dilatation. No choledocholithiasis Electronically Signed   By: Kerby Moors M.D.   On: 04/13/2015 10:09    Assessment & Plan:   There are no diagnoses linked to this encounter. I am having Ms. Carano maintain her Cetirizine-Pseudoephedrine (ZYRTEC-D PO), Vitamin D-3, fluticasone, rOPINIRole, pantoprazole, Multiple Vitamins-Minerals (CENTRUM VITAMINTS PO), ALREX, magnesium oxide, Calcium-Vitamin D-Vitamin K (CVS CALCIUM SOFT CHEWS PO), Estradiol, polyethylene  glycol, guaiFENesin, B Complex-Folic Acid (B COMPLEX-VITAMIN B12 PO), and CVS CALCIUM 600 PLUS. We will continue to administer sodium chloride.  Meds ordered this encounter  Medications  . polyethylene glycol (MIRALAX / GLYCOLAX) packet    Sig: Take 17 g by mouth at bedtime.  Marland Kitchen guaiFENesin (MUCINEX) 600 MG 12 hr tablet    Sig: Take 600 mg by mouth 2 (two) times daily.  . B Complex-Folic Acid (B COMPLEX-VITAMIN B12 PO)    Sig: Take by mouth.  . Multiple Minerals-Vitamins (CVS CALCIUM 600 PLUS) CHEW    Sig: Chew 1 each by mouth daily.     Follow-up: No Follow-up on file.  Maahs Kehr, MD

## 2016-02-16 NOTE — Patient Instructions (Signed)
Rice bag to cold feet Tylenol PM for cramps

## 2016-02-16 NOTE — Progress Notes (Signed)
Pre visit review using our clinic review tool, if applicable. No additional management support is needed unless otherwise documented below in the visit note. 

## 2016-02-18 ENCOUNTER — Encounter: Payer: Self-pay | Admitting: Internal Medicine

## 2016-02-18 DIAGNOSIS — R252 Cramp and spasm: Secondary | ICD-10-CM | POA: Insufficient documentation

## 2016-02-18 NOTE — Assessment & Plan Note (Signed)
Rice bag Tylenol pm

## 2016-02-19 ENCOUNTER — Other Ambulatory Visit (INDEPENDENT_AMBULATORY_CARE_PROVIDER_SITE_OTHER): Payer: 59

## 2016-02-19 DIAGNOSIS — Z0000 Encounter for general adult medical examination without abnormal findings: Secondary | ICD-10-CM | POA: Diagnosis not present

## 2016-02-19 DIAGNOSIS — Z9884 Bariatric surgery status: Secondary | ICD-10-CM

## 2016-02-19 LAB — CBC WITH DIFFERENTIAL/PLATELET
BASOS PCT: 0.5 % (ref 0.0–3.0)
Basophils Absolute: 0 10*3/uL (ref 0.0–0.1)
EOS PCT: 3.1 % (ref 0.0–5.0)
Eosinophils Absolute: 0.2 10*3/uL (ref 0.0–0.7)
HCT: 36.8 % (ref 36.0–46.0)
Hemoglobin: 12.6 g/dL (ref 12.0–15.0)
LYMPHS ABS: 2.1 10*3/uL (ref 0.7–4.0)
Lymphocytes Relative: 36.1 % (ref 12.0–46.0)
MCHC: 34.3 g/dL (ref 30.0–36.0)
MCV: 85.2 fl (ref 78.0–100.0)
MONOS PCT: 6.4 % (ref 3.0–12.0)
Monocytes Absolute: 0.4 10*3/uL (ref 0.1–1.0)
NEUTROS ABS: 3.1 10*3/uL (ref 1.4–7.7)
NEUTROS PCT: 53.9 % (ref 43.0–77.0)
Platelets: 217 10*3/uL (ref 150.0–400.0)
RBC: 4.32 Mil/uL (ref 3.87–5.11)
RDW: 13.7 % (ref 11.5–15.5)
WBC: 5.7 10*3/uL (ref 4.0–10.5)

## 2016-02-19 LAB — LIPID PANEL
CHOLESTEROL: 211 mg/dL — AB (ref 0–200)
HDL: 101.6 mg/dL (ref >39.00)
LDL Cholesterol: 97 mg/dL (ref 0–99)
NonHDL: 109.76
Total CHOL/HDL Ratio: 2
Triglycerides: 64 mg/dL (ref 0.0–149.0)
VLDL: 12.8 mg/dL (ref 0.0–40.0)

## 2016-02-19 LAB — VITAMIN B12: VITAMIN B 12: 912 pg/mL — AB (ref 211–911)

## 2016-02-19 LAB — TSH: TSH: 1.05 u[IU]/mL (ref 0.35–4.50)

## 2016-02-19 LAB — URINALYSIS
BILIRUBIN URINE: NEGATIVE
Hgb urine dipstick: NEGATIVE
KETONES UR: NEGATIVE
LEUKOCYTES UA: NEGATIVE
Nitrite: NEGATIVE
Specific Gravity, Urine: 1.025 (ref 1.000–1.030)
Total Protein, Urine: NEGATIVE
UROBILINOGEN UA: 0.2 (ref 0.0–1.0)
Urine Glucose: NEGATIVE
pH: 5.5 (ref 5.0–8.0)

## 2016-02-19 LAB — BASIC METABOLIC PANEL
BUN: 20 mg/dL (ref 6–23)
CHLORIDE: 105 meq/L (ref 96–112)
CO2: 27 meq/L (ref 19–32)
Calcium: 9.2 mg/dL (ref 8.4–10.5)
Creatinine, Ser: 0.69 mg/dL (ref 0.40–1.20)
GFR: 93.93 mL/min (ref >60.00)
GLUCOSE: 102 mg/dL — AB (ref 70–99)
POTASSIUM: 3.9 meq/L (ref 3.5–5.1)
SODIUM: 138 meq/L (ref 135–145)

## 2016-02-19 LAB — HEPATIC FUNCTION PANEL
ALBUMIN: 4.1 g/dL (ref 3.5–5.2)
ALT: 16 U/L (ref 0–35)
AST: 47 U/L — AB (ref 0–37)
Alkaline Phosphatase: 110 U/L (ref 39–117)
Bilirubin, Direct: 0 mg/dL (ref 0.0–0.3)
Total Bilirubin: 0.3 mg/dL (ref 0.2–1.2)
Total Protein: 6.8 g/dL (ref 6.0–8.3)

## 2016-03-04 MED FILL — PANTOPRAZOLE SOD DR 40 MG T: 40 | 90 days supply | Qty: 90 | Fill #0

## 2016-03-04 MED FILL — rOPINIRole HCL 0.25 MG TABS: 0.25 | 90 days supply | Qty: 270 | Fill #0

## 2016-03-18 MED FILL — YUVAFEM 10 MCG VAGINAL INSE: 10 | 28 days supply | Qty: 8 | Fill #1

## 2016-04-04 ENCOUNTER — Encounter: Payer: 59 | Attending: Internal Medicine | Admitting: Skilled Nursing Facility1

## 2016-04-04 DIAGNOSIS — Z713 Dietary counseling and surveillance: Secondary | ICD-10-CM | POA: Insufficient documentation

## 2016-04-04 DIAGNOSIS — E669 Obesity, unspecified: Secondary | ICD-10-CM

## 2016-04-04 NOTE — Patient Instructions (Addendum)
-  Aim for 3 of your big cups of water and 1-2 of your mugs  -Try G2 Gatorade while you workout and then after your workout try a piece of fruit and a small handful of nuts   -Take each calcium 2 hours from each other and the multi  -Look into a new multi  -for your meals: eat your 3 oz of protein first then try 2 bites of a veggie   -When you know you are going to be active have a piece of fruit, whole wheat bread, brown rice, oatmeal, whole pasta

## 2016-04-04 NOTE — Progress Notes (Signed)
Follow-up visit:  15 Months Post-Operative Sleeve Gastrectomy Surgery  Medical Nutrition Therapy:  Appt start time: 825 end time:  900  Primary concerns today: Post-operative Bariatric Surgery Nutrition Management.   Pt has had the sleeve gastrectomy 2 years ago today. Pt states she needs a readjustment to lose more weight. Pt seems agitated at all questions asked and asked if Kathlee Nations and Magda Paganini were coming back. Pt states she sees her surgeon march 29th. Pt states she has a lot of cramps in her feet and legs at night, worse during the week when she is working out. Pt also states she can hear her heart beat at night when everything is quiet stating it is not fast or slow and there is no pain. Pt states after a walk she felt lightheaded and dizzy before her walk she had bacon and eggs.  TANITA  BODY COMP RESULTS  04/04/2016   BMI (kg/m^2) 30.8   Fat Mass (lbs) 63.6   Fat Free Mass (lbs) 110.4   Total Body Water (lbs) 77.8     Surgery date: 04/05/2014 Surgery type: Gastric sleeve Start weight at [facility]: 269 lbs on 01/08/2014 Weight today: 168.8 lbs Weight change: 1.2 lbs  Total weight loss: 100.2 lbs  TANITA BODY COMP RESULTS  03/14/14 04/19/14 05/31/14 06/27/14 09/27/14 12/12/14 01/03/15 04/05/15 07/11/15  BMI (kg/m^2) 47.7 43.0 40.7 38.8 34.9 33.0 32.7 30.1 29.9  Fat Mass (lbs) 134.5 123.5 98.5 99.5 79.0 63.0 71.0 57.5 56.8  Fat Free Mass (lbs) 135 119.0 131.5 119.5 118.0 123.5 113.5 112.5 112  Total Body Water (lbs) 99 87.0 96.5 87.5 86.5 90.5 83.0 82.5 78.8                  Preferred Learning Style:   No preference indicated   Learning Readiness:   Ready  24-hr recall: B (6 AM): protein powder (25-30 g) with unsweet almond milk-was having fruit but then cut that out-----egg and bacon and cheese and fruit Snk (AM): sometimes---popcorn L (PM): 3-4 oz chicken OR yogurt and nuts (15-17g) spoonful of mashed potatoes  Snk  (PM): sometimes yogurt or nuts D (PM): yogurt and nuts (14g) Snk (PM): pork skins---sugar free candy  Fluid intake: 16 oz of protein shake, 36 oz water, zero calorie water  (~54 oz) Estimated total protein intake: 65-70 g  Medications:  See list  Supplementation: Taking and added 2000 IU of vitamin D 3x a week, B12 complex  Using straws: sometimes if driving, not often Drinking while eating: No Hair loss: no Carbonated beverages: yes: sugar free N/V/D/C/GAS:no, no, no, yes: taking miralax daily or every other day to keep her regular, no Dumping syndrome: No Having you been chewing well: thinks so most of the time Chewing/swallowing difficulties:  no Changes in vision:  Yes: has gotten a little better with cataracts  Changes to mood/headaches:  no Hair loss/Changes to skin/Changes to nails: no Any difficulty focusing or concentrating: no Sweating:  no Dizziness/Lightheaded: with some sinus congestion  Palpitations: no Abdominal Pain: no  Recent physical activity:  [employer] program MWF, yardwork and other activities occasionally (walking and kayaking)  Progress Towards Goal(s):  In progress.   Nutritional Diagnosis:  [place]-3.3 Overweight/obesity related to past poor dietary habits and physical inactivity as evidenced by patient w/ recent sleeve gastrectomy surgery following dietary guidelines for continued weight loss.    Intervention:  Nutrition education/diet reinforcement Goals: -Aim for 3 of your big cups of water and 1-2 of your mugs -Try G2 Gatorade while  you workout and then after your workout try a piece of fruit and a small handful of nuts  -Take each calcium 2 hours from each other and the multi -Look into a new multi -for your meals: eat your 3 oz of protein first then try 2 bites of a veggie  -When you know you are going to be active have a piece of fruit, whole wheat bread, brown rice, oatmeal, whole pasta  Teaching Method Utilized:  Visual Auditory Hands  on  Barriers to learning/adherence to lifestyle change: none  Demonstrated degree of understanding via:  Teach Back   Monitoring/Evaluation:  Dietary intake, exercise, and body weight.

## 2016-04-23 MED FILL — YUVAFEM 10 MCG VAGINAL INSE: 10 | 28 days supply | Qty: 8 | Fill #2

## 2016-05-02 DIAGNOSIS — Z9884 Bariatric surgery status: Secondary | ICD-10-CM | POA: Diagnosis not present

## 2016-05-21 MED FILL — YUVAFEM 10 MCG VAGINAL INSE: 10 | 28 days supply | Qty: 8 | Fill #3

## 2016-05-25 IMAGING — MR MR ABDOMEN WO/W CM MRCP
11 of 23 series · 22 of 48 positions shown · IV contrast (multihance)
Comparison: None.

CLINICAL DATA: History of pancreatitis.  Elevated LFTs.

EXAM:
MRI ABDOMEN WITHOUT AND WITH CONTRAST (INCLUDING MRCP)
TECHNIQUE: Multiplanar multisequence MR imaging of the abdomen was performed
both before and after the administration of intravenous contrast.
Heavily T2-weighted images of the biliary and pancreatic ducts were
obtained, and three-dimensional MRCP images were rendered by post
processing.
CONTRAST:  16mL MULTIHANCE GADOBENATE DIMEGLUMINE 529 MG/ML IV SOLN

[Series 3: T2 fat-sat · axial · 5.0mm · 0.78mm/px · z∈[-94,+176]mm · 2 of 55 slices shown]
[im 1/55]
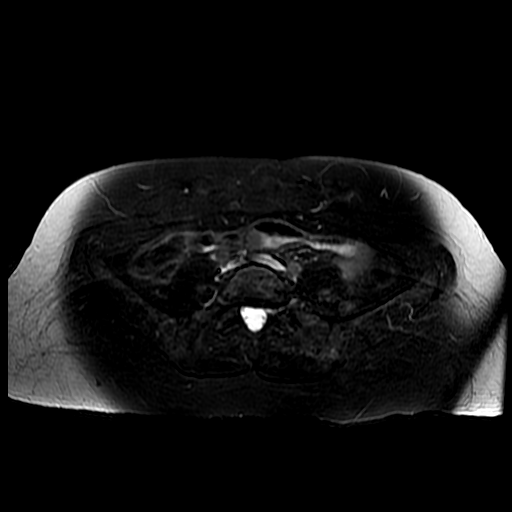
[im 55/55]
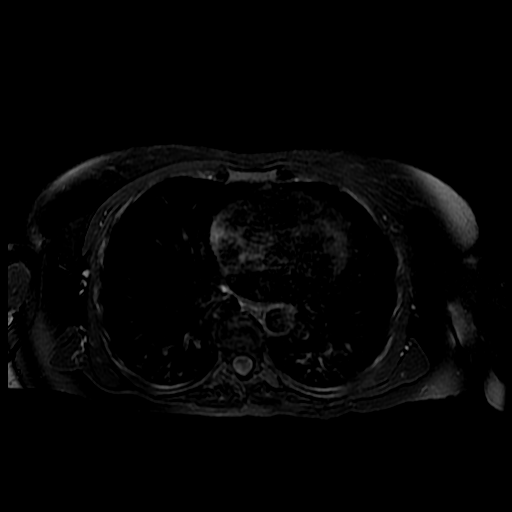

[Series 5: MRCP · coronal · 2.0mm · 0.70mm/px · 2 of 40 slices shown (1 of 2)]
[im 1/40]
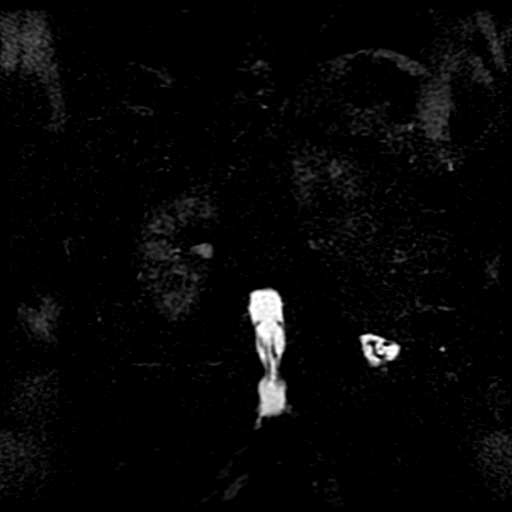
[im 40/40]
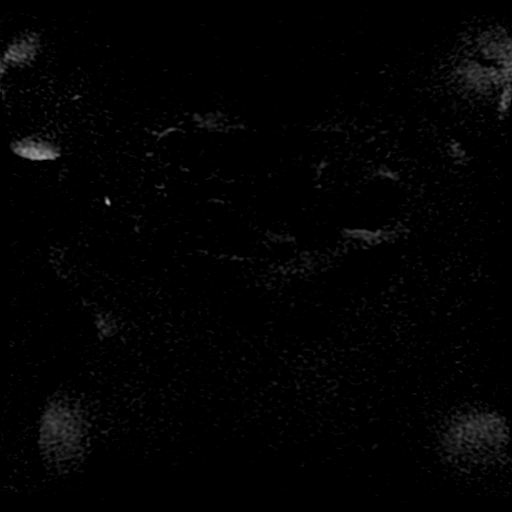

[Series 6: DWI b500 · axial · 6.0mm · 1.48mm/px · z∈[-78,+179]mm · 3 of 66 slices shown]
[im 1/66]
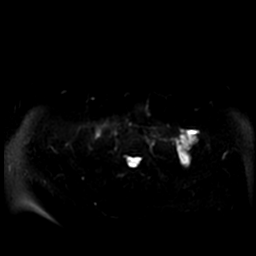
[im 33/66]
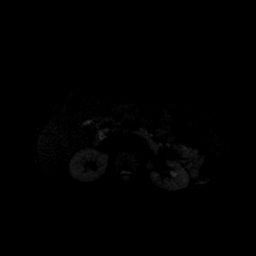
[im 66/66]
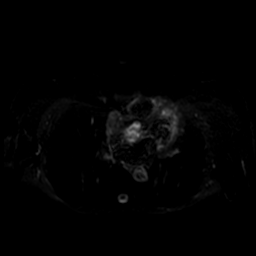

[Series 7: ax dualecho · axial · 5.0mm · 0.78mm/px · z∈[-75,+180]mm · 3 of 104 slices shown]
[im 1/104]
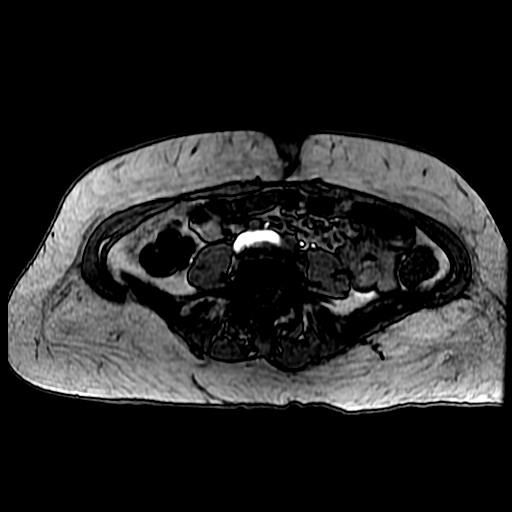
[im 52/104]
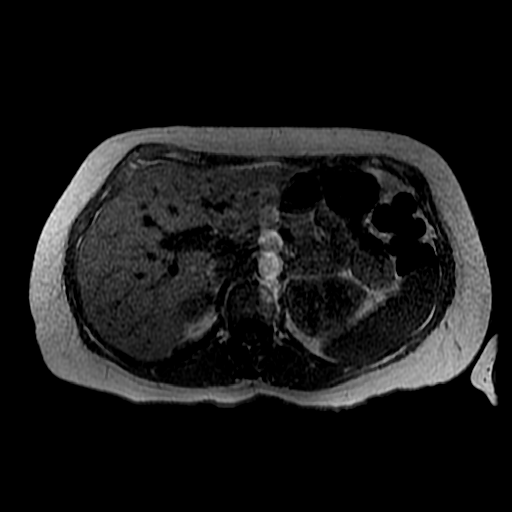
[im 104/104]
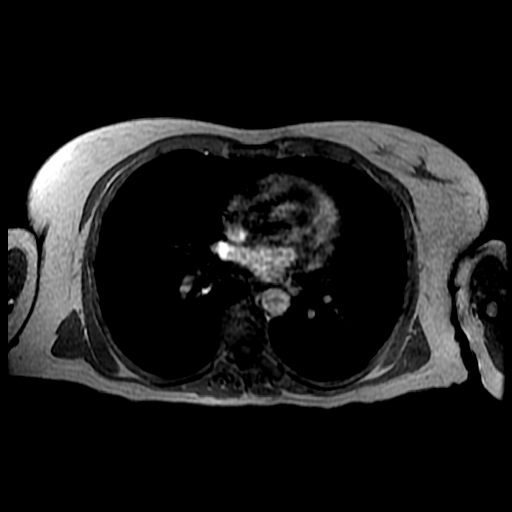

[Series 8: MRCP · coronal · 40.0mm · 0.70mm/px · 1 of 6 slices shown (2 of 2)]
[im 1/6]
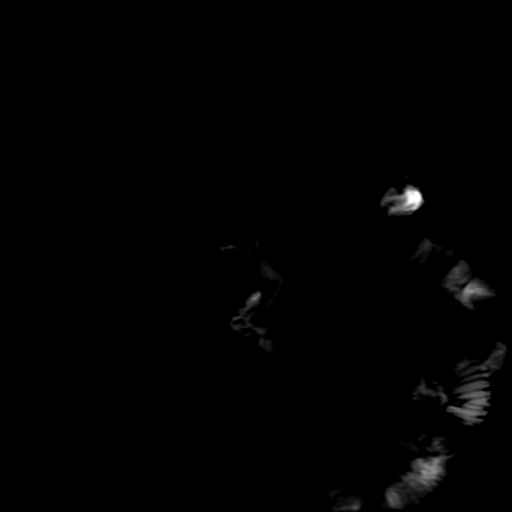

[Series 9: bSSFP fat-sat · coronal · 5.0mm · 0.70mm/px · 1 of 39 slices shown]
[im 1/39]
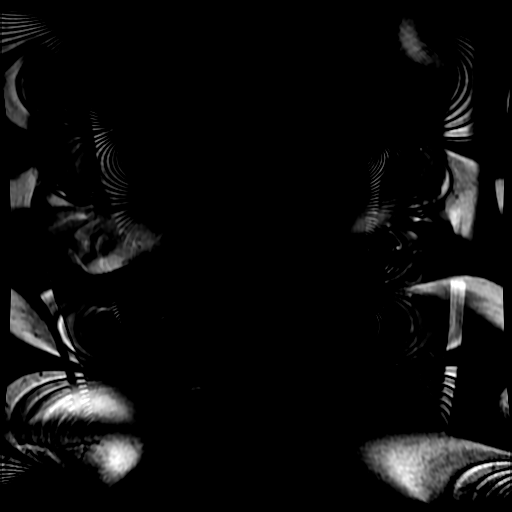

[Series 10: T2 · axial · 5.0mm · 0.78mm/px · 1 of 52 slices shown]
[im 1/52]
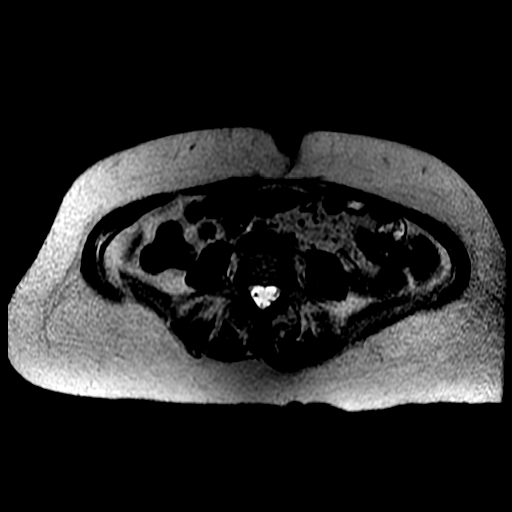

[Series 12: T1 dynamic · coronal · delayed · 4.0mm · 0.78mm/px · 2 of 88 slices shown]
[im 1/88]
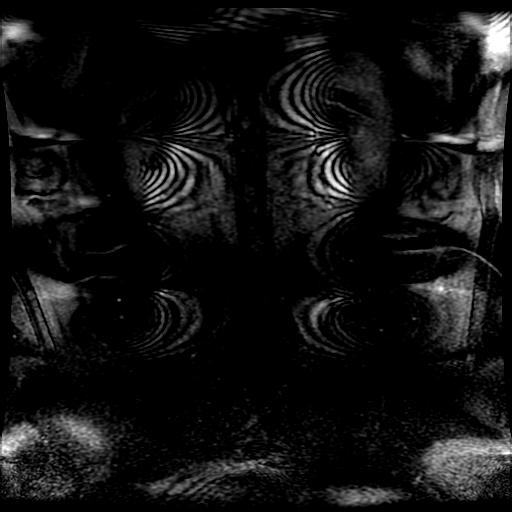
[im 88/88]
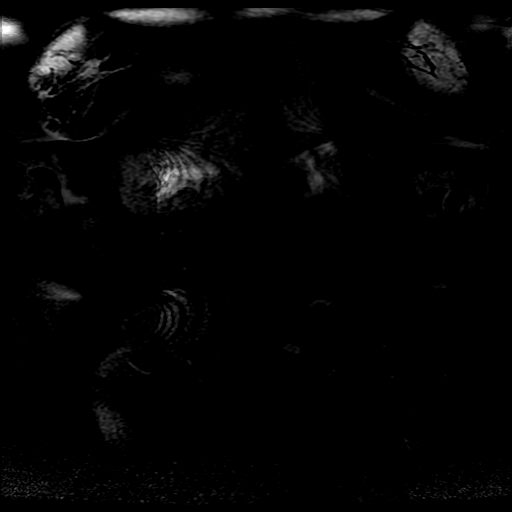

[Series 400: reformatted · axial · 1.6mm · 0.62mm/px · z∈[+38,+168]mm · 3 of 98 slices shown (1 of 2)]
[im 1/98]
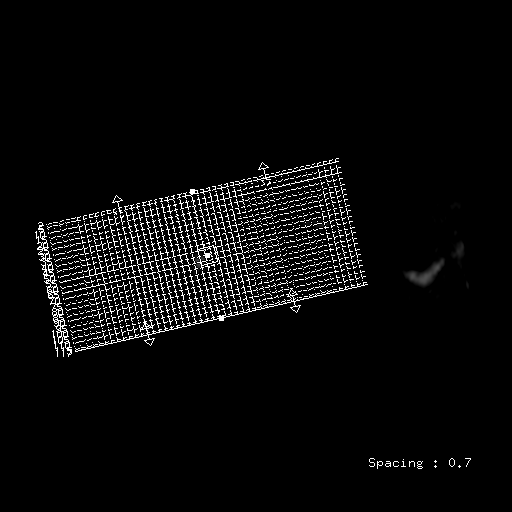
[im 49/98]
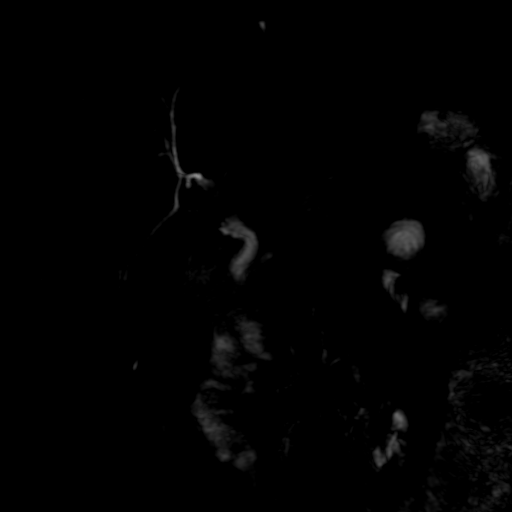
[im 98/98]
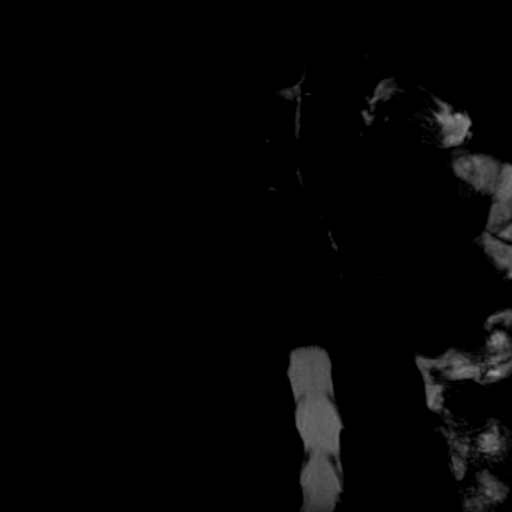

[Series 401: reformatted · coronal · 100.0mm · 0.51mm/px · 3 of 124 slices shown (2 of 2)]
[im 1/124]
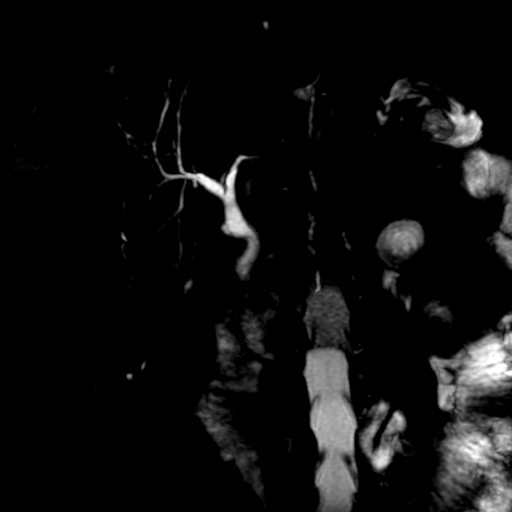
[im 62/124]
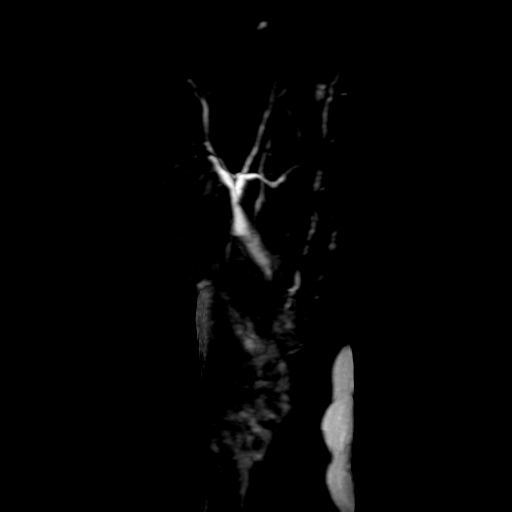
[im 124/124]
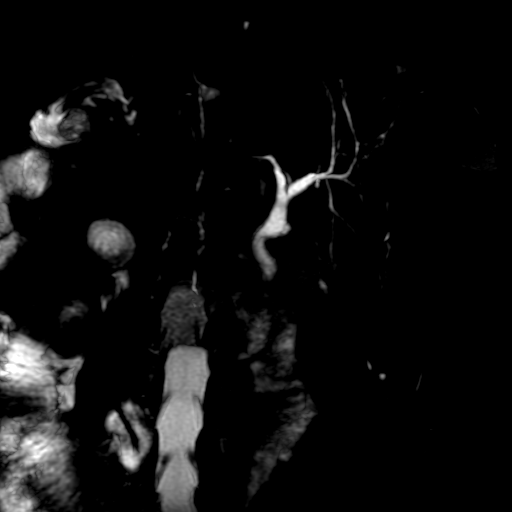

[Series 600: DWI · axial · 6.0mm · 1.48mm/px · 1 of 34 slices shown]
[im 1/34]
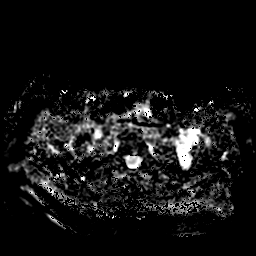

[22 of 48 positions shown; findings below may reference images not displayed]

FINDINGS: Lower chest: No pleural effusion identified. The lung bases are
clear.

Hepatobiliary: No focal liver abnormality. Previous cholecystectomy.
The common bile duct measures up to 6 mm. No obstructing stone or
mass noted.

Pancreas: The pancreas is normal. No inflammation or mass
identified.

Spleen: Normal in size.

Adrenals/Urinary Tract: The adrenal glands are unremarkable. Normal
appearance of the kidneys.

Stomach/Bowel: The stomach is normal. The visualized upper abdominal
bowel loops are unremarkable. No dilated loops of bowel identified.

Vascular/Lymphatic: Normal appearance of the abdominal aorta. No
upper abdominal adenopathy identified.

Other: Negative

Musculoskeletal: No abnormal signal identified from within the bone
marrow.
IMPRESSION: 1. No acute findings identified within the upper abdomen.
2. Previous cholecystectomy. No significant biliary dilatation. No
choledocholithiasis

## 2016-06-03 MED FILL — rOPINIRole HCL 0.25 MG TABS: 0.25 | 90 days supply | Qty: 270 | Fill #1

## 2016-06-03 MED FILL — PANTOPRAZOLE SOD DR 40 MG T: 40 | 90 days supply | Qty: 90 | Fill #1

## 2016-06-19 ENCOUNTER — Encounter: Payer: Self-pay | Admitting: Gynecology

## 2016-08-13 ENCOUNTER — Ambulatory Visit (INDEPENDENT_AMBULATORY_CARE_PROVIDER_SITE_OTHER): Payer: 59 | Admitting: Internal Medicine

## 2016-08-13 ENCOUNTER — Encounter: Payer: Self-pay | Admitting: Internal Medicine

## 2016-08-13 DIAGNOSIS — Z9884 Bariatric surgery status: Secondary | ICD-10-CM | POA: Diagnosis not present

## 2016-08-13 DIAGNOSIS — D509 Iron deficiency anemia, unspecified: Secondary | ICD-10-CM | POA: Diagnosis not present

## 2016-08-13 DIAGNOSIS — E663 Overweight: Secondary | ICD-10-CM | POA: Diagnosis not present

## 2016-08-13 NOTE — Assessment & Plan Note (Signed)
Wt Readings from Last 3 Encounters:  08/13/16 184 lb (83.5 kg)  04/04/16 174 lb (78.9 kg)  02/16/16 175 lb (79.4 kg)

## 2016-08-13 NOTE — Assessment & Plan Note (Signed)
Labs

## 2016-08-13 NOTE — Progress Notes (Signed)
Subjective:  Patient ID: Suzanne Schaefer, female    DOB: Dec 26, 1960  Age: 56 y.o. MRN: [id number]  CC: No chief complaint on file.   HPI CHAREESE SERGENT presents for fatigue, somnolence x weeks, RLS, wt loss f/u  Outpatient Medications Prior to Visit  Medication Sig Dispense Refill  . ALREX 0.2 % SUSP Place 1 drop into the left eye 2 (two) times daily as needed (irritation).   1  . B Complex-Folic Acid (B COMPLEX-VITAMIN B12 PO) Take by mouth.    . Calcium-Vitamin D-Vitamin K ([employer] CALCIUM SOFT CHEWS PO) Take 1,200 mg by mouth daily.    . Cetirizine-Pseudoephedrine (ZYRTEC-D PO) Take 1 tablet by mouth every morning.     . Cholecalciferol (VITAMIN D-3) 1000 UNITS CAPS Take 2 capsules by mouth 3 (three) times a week.     . fluticasone (FLONASE) 50 MCG/ACT nasal spray Place 2 sprays into both nostrils at bedtime.    Marland Kitchen guaiFENesin (MUCINEX) 600 MG 12 hr tablet Take 600 mg by mouth 2 (two) times daily.    . magnesium oxide (MAG-OX) 400 MG tablet Take 400 mg by mouth 3 (three) times a week. Mon,Wed,Fri    . Multiple Minerals-Vitamins (CVS CALCIUM 600 PLUS) CHEW Chew 1 each by mouth daily.    . Multiple Vitamins-Minerals (CENTRUM VITAMINTS PO) Take 1 each by mouth 2 (two) times daily.     . pantoprazole (PROTONIX) 40 MG tablet Take 1 tablet (40 mg total) by mouth daily. 90 tablet 3  . polyethylene glycol (MIRALAX / GLYCOLAX) packet Take 17 g by mouth at bedtime.    Marland Kitchen rOPINIRole (REQUIP) 0.25 MG tablet Take 3 tablets (0.75 mg total) by mouth at bedtime. 270 tablet 3  . Estradiol 10 MCG TABS vaginal tablet Place 1 tablet (10 mcg total) vaginally 2 (two) times a week. (Patient not taking: Reported on 08/13/2016) 8 tablet 11   Facility-Administered Medications Prior to Visit  Medication Dose Route Frequency Provider Last Rate Last Dose  . 0.9 %  sodium chloride infusion  500 mL Intravenous Continuous Gatha Mayer, MD        ROS Review of Systems  Constitutional: Positive for fatigue. Negative  for activity change, appetite change, chills and unexpected weight change.  HENT: Negative for congestion, mouth sores and sinus pressure.   Eyes: Negative for visual disturbance.  Respiratory: Negative for cough and chest tightness.   Gastrointestinal: Negative for abdominal pain and nausea.  Genitourinary: Negative for difficulty urinating, frequency and vaginal pain.  Musculoskeletal: Negative for back pain and gait problem.  Skin: Negative for pallor and rash.  Neurological: Negative for dizziness, tremors, weakness, numbness and headaches.  Psychiatric/Behavioral: Negative for confusion and sleep disturbance.    Objective:  BP 110/72 (BP Location: Left Arm, Patient Position: Sitting, Cuff Size: Normal)   Pulse 67   Temp 98.3 F (36.8 C) (Oral)   Ht 5\' 2"  (1.575 m)   Wt 184 lb (83.5 kg)   SpO2 99%   BMI 33.65 kg/m   BP Readings from Last 3 Encounters:  08/13/16 110/72  02/16/16 120/80  02/09/16 120/70    Wt Readings from Last 3 Encounters:  08/13/16 184 lb (83.5 kg)  04/04/16 174 lb (78.9 kg)  02/16/16 175 lb (79.4 kg)    Physical Exam  Constitutional: She appears well-developed. No distress.  HENT:  Head: Normocephalic.  Right Ear: External ear normal.  Left Ear: External ear normal.  Nose: Nose normal.  Mouth/Throat: Oropharynx is clear and  moist.  Eyes: Conjunctivae are normal. Pupils are equal, round, and reactive to light. Right eye exhibits no discharge. Left eye exhibits no discharge.  Neck: Normal range of motion. Neck supple. No JVD present. No tracheal deviation present. No thyromegaly present.  Cardiovascular: Normal rate, regular rhythm and normal heart sounds.   Pulmonary/Chest: No stridor. No respiratory distress. She has no wheezes.  Abdominal: Soft. Bowel sounds are normal. She exhibits no distension and no mass. There is no tenderness. There is no rebound and no guarding.  Musculoskeletal: She exhibits no edema or tenderness.  Lymphadenopathy:     She has no cervical adenopathy.  Neurological: She displays normal reflexes. No cranial nerve deficit. She exhibits normal muscle tone. Coordination normal.  Skin: No rash noted. No erythema.  Psychiatric: She has a normal mood and affect. Her behavior is normal. Judgment and thought content normal.    Lab Results  Component Value Date   WBC 5.7 02/19/2016   HGB 12.6 02/19/2016   HCT 36.8 02/19/2016   PLT 217.0 02/19/2016   GLUCOSE 102 (H) 02/19/2016   CHOL 211 (H) 02/19/2016   TRIG 64.0 02/19/2016   HDL 101.60 02/19/2016   LDLDIRECT 115.2 01/27/2013   LDLCALC 97 02/19/2016   ALT 16 02/19/2016   AST 47 (H) 02/19/2016   NA 138 02/19/2016   K 3.9 02/19/2016   CL 105 02/19/2016   CREATININE 0.69 02/19/2016   BUN 20 02/19/2016   CO2 27 02/19/2016   TSH 1.05 02/19/2016   INR 1.12 02/24/2015   HGBA1C 5.4 08/31/2014    Mr 3d Recon At Scanner  Result Date: 04/13/2015 CLINICAL DATA:  History of pancreatitis.  Elevated LFTs. EXAM: MRI ABDOMEN WITHOUT AND WITH CONTRAST (INCLUDING MRCP) TECHNIQUE: Multiplanar multisequence MR imaging of the abdomen was performed both before and after the administration of intravenous contrast. Heavily T2-weighted images of the biliary and pancreatic ducts were obtained, and three-dimensional MRCP images were rendered by post processing. CONTRAST:  48mL MULTIHANCE GADOBENATE DIMEGLUMINE 529 MG/ML IV SOLN COMPARISON:  None. FINDINGS: Lower chest: No pleural effusion identified. The lung bases are clear. Hepatobiliary: No focal liver abnormality. Previous cholecystectomy. The common bile duct measures up to 6 mm. No obstructing stone or mass noted. Pancreas: The pancreas is normal. No inflammation or mass identified. Spleen: Normal in size. Adrenals/Urinary Tract: The adrenal glands are unremarkable. Normal appearance of the kidneys. Stomach/Bowel: The stomach is normal. The visualized upper abdominal bowel loops are unremarkable. No dilated loops of bowel  identified. Vascular/Lymphatic: Normal appearance of the abdominal aorta. No upper abdominal adenopathy identified. Other: Negative Musculoskeletal: No abnormal signal identified from within the bone marrow. IMPRESSION: 1. No acute findings identified within the upper abdomen. 2. Previous cholecystectomy. No significant biliary dilatation. No choledocholithiasis Electronically Signed   By: Kerby Moors M.D.   On: 04/13/2015 10:09   Mr Jeananne Rama W/wo Cm/mrcp  Result Date: 04/13/2015 CLINICAL DATA:  History of pancreatitis.  Elevated LFTs. EXAM: MRI ABDOMEN WITHOUT AND WITH CONTRAST (INCLUDING MRCP) TECHNIQUE: Multiplanar multisequence MR imaging of the abdomen was performed both before and after the administration of intravenous contrast. Heavily T2-weighted images of the biliary and pancreatic ducts were obtained, and three-dimensional MRCP images were rendered by post processing. CONTRAST:  57mL MULTIHANCE GADOBENATE DIMEGLUMINE 529 MG/ML IV SOLN COMPARISON:  None. FINDINGS: Lower chest: No pleural effusion identified. The lung bases are clear. Hepatobiliary: No focal liver abnormality. Previous cholecystectomy. The common bile duct measures up to 6 mm. No obstructing stone or mass noted.  Pancreas: The pancreas is normal. No inflammation or mass identified. Spleen: Normal in size. Adrenals/Urinary Tract: The adrenal glands are unremarkable. Normal appearance of the kidneys. Stomach/Bowel: The stomach is normal. The visualized upper abdominal bowel loops are unremarkable. No dilated loops of bowel identified. Vascular/Lymphatic: Normal appearance of the abdominal aorta. No upper abdominal adenopathy identified. Other: Negative Musculoskeletal: No abnormal signal identified from within the bone marrow. IMPRESSION: 1. No acute findings identified within the upper abdomen. 2. Previous cholecystectomy. No significant biliary dilatation. No choledocholithiasis Electronically Signed   By: Kerby Moors M.D.   On:  04/13/2015 10:09    Assessment & Plan:   There are no diagnoses linked to this encounter. I have discontinued Ms. Sidell's Estradiol. I am also having her maintain her Cetirizine-Pseudoephedrine (ZYRTEC-D PO), Vitamin D-3, fluticasone, Multiple Vitamins-Minerals (CENTRUM VITAMINTS PO), ALREX, magnesium oxide, Calcium-Vitamin D-Vitamin K (CVS CALCIUM SOFT CHEWS PO), polyethylene glycol, guaiFENesin, B Complex-Folic Acid (B COMPLEX-VITAMIN B12 PO), CVS CALCIUM 600 PLUS, pantoprazole, rOPINIRole, Misc Natural Products (HEALTHY LIVER PO), and Misc Natural Products (HM JOINT HEALTH ULTRA PO). We will continue to administer sodium chloride.  Meds ordered this encounter  Medications  . Misc Natural Products (HEALTHY LIVER PO)    Sig: Take by mouth.  . Misc Natural Products (HM JOINT HEALTH ULTRA PO)    Sig: Take by mouth.     Follow-up: No Follow-up on file.  Maddocks Kehr, MD

## 2016-08-14 ENCOUNTER — Encounter (INDEPENDENT_AMBULATORY_CARE_PROVIDER_SITE_OTHER): Payer: 59 | Admitting: Family Medicine

## 2016-08-14 DIAGNOSIS — Z0289 Encounter for other administrative examinations: Secondary | ICD-10-CM

## 2016-08-15 ENCOUNTER — Other Ambulatory Visit (INDEPENDENT_AMBULATORY_CARE_PROVIDER_SITE_OTHER): Payer: 59

## 2016-08-15 ENCOUNTER — Other Ambulatory Visit: Payer: 59

## 2016-08-15 DIAGNOSIS — Z9884 Bariatric surgery status: Secondary | ICD-10-CM | POA: Diagnosis not present

## 2016-08-15 DIAGNOSIS — Z0000 Encounter for general adult medical examination without abnormal findings: Secondary | ICD-10-CM

## 2016-08-15 DIAGNOSIS — E663 Overweight: Secondary | ICD-10-CM | POA: Diagnosis not present

## 2016-08-15 DIAGNOSIS — D509 Iron deficiency anemia, unspecified: Secondary | ICD-10-CM | POA: Diagnosis not present

## 2016-08-15 LAB — CBC WITH DIFFERENTIAL/PLATELET
Basophils Absolute: 0 10*3/uL (ref 0.0–0.1)
Basophils Relative: 0.5 % (ref 0.0–3.0)
EOS PCT: 2.6 % (ref 0.0–5.0)
Eosinophils Absolute: 0.1 10*3/uL (ref 0.0–0.7)
HCT: 38 % (ref 36.0–46.0)
HEMOGLOBIN: 13 g/dL (ref 12.0–15.0)
LYMPHS ABS: 2 10*3/uL (ref 0.7–4.0)
Lymphocytes Relative: 38.6 % (ref 12.0–46.0)
MCHC: 34.3 g/dL (ref 30.0–36.0)
MCV: 85.6 fl (ref 78.0–100.0)
MONO ABS: 0.4 10*3/uL (ref 0.1–1.0)
MONOS PCT: 8.3 % (ref 3.0–12.0)
Neutro Abs: 2.6 10*3/uL (ref 1.4–7.7)
Neutrophils Relative %: 50 % (ref 43.0–77.0)
Platelets: 200 10*3/uL (ref 150.0–400.0)
RBC: 4.44 Mil/uL (ref 3.87–5.11)
RDW: 13.5 % (ref 11.5–15.5)
WBC: 5.2 10*3/uL (ref 4.0–10.5)

## 2016-08-15 LAB — BASIC METABOLIC PANEL
BUN: 25 mg/dL — AB (ref 6–23)
CHLORIDE: 102 meq/L (ref 96–112)
CO2: 30 mEq/L (ref 19–32)
Calcium: 9.5 mg/dL (ref 8.4–10.5)
Creatinine, Ser: 0.76 mg/dL (ref 0.40–1.20)
GFR: 83.87 mL/min (ref >60.00)
GLUCOSE: 99 mg/dL (ref 70–99)
POTASSIUM: 4.3 meq/L (ref 3.5–5.1)
Sodium: 139 mEq/L (ref 135–145)

## 2016-08-15 LAB — URINALYSIS
BILIRUBIN URINE: NEGATIVE
HGB URINE DIPSTICK: NEGATIVE
KETONES UR: NEGATIVE
Leukocytes, UA: NEGATIVE
Nitrite: NEGATIVE
PH: 6 (ref 5.0–8.0)
Specific Gravity, Urine: <=1.005 — AB (ref 1.000–1.030)
TOTAL PROTEIN, URINE-UPE24: NEGATIVE
URINE GLUCOSE: NEGATIVE
Urobilinogen, UA: 0.2 (ref 0.0–1.0)

## 2016-08-15 LAB — HEPATIC FUNCTION PANEL
ALBUMIN: 4 g/dL (ref 3.5–5.2)
ALT: 18 U/L (ref 0–35)
AST: 50 U/L — AB (ref 0–37)
Alkaline Phosphatase: 111 U/L (ref 39–117)
Bilirubin, Direct: 0 mg/dL (ref 0.0–0.3)
Total Bilirubin: 0.4 mg/dL (ref 0.2–1.2)
Total Protein: 7 g/dL (ref 6.0–8.3)

## 2016-08-15 LAB — TSH: TSH: 1.29 u[IU]/mL (ref 0.35–4.50)

## 2016-08-15 LAB — VITAMIN B12: Vitamin B-12: 949 pg/mL — ABNORMAL HIGH (ref 211–911)

## 2016-08-15 LAB — VITAMIN D 25 HYDROXY (VIT D DEFICIENCY, FRACTURES): VITD: 59.46 ng/mL (ref 30.00–100.00)

## 2016-08-19 LAB — VITAMIN B1: VITAMIN B1 (THIAMINE): 30 nmol/L (ref 8–30)

## 2016-08-23 ENCOUNTER — Ambulatory Visit (INDEPENDENT_AMBULATORY_CARE_PROVIDER_SITE_OTHER): Payer: 59 | Admitting: Family Medicine

## 2016-08-23 ENCOUNTER — Encounter (INDEPENDENT_AMBULATORY_CARE_PROVIDER_SITE_OTHER): Payer: Self-pay | Admitting: Family Medicine

## 2016-08-23 ENCOUNTER — Other Ambulatory Visit (INDEPENDENT_AMBULATORY_CARE_PROVIDER_SITE_OTHER): Payer: Self-pay | Admitting: Family Medicine

## 2016-08-23 VITALS — BP 101/61 | HR 55 | Temp 97.9°F | Ht 62.0 in | Wt 178.0 lb

## 2016-08-23 DIAGNOSIS — E669 Obesity, unspecified: Secondary | ICD-10-CM | POA: Diagnosis not present

## 2016-08-23 DIAGNOSIS — Z1389 Encounter for screening for other disorder: Secondary | ICD-10-CM

## 2016-08-23 DIAGNOSIS — R0609 Other forms of dyspnea: Secondary | ICD-10-CM | POA: Diagnosis not present

## 2016-08-23 DIAGNOSIS — R739 Hyperglycemia, unspecified: Secondary | ICD-10-CM

## 2016-08-23 DIAGNOSIS — R5383 Other fatigue: Secondary | ICD-10-CM | POA: Diagnosis not present

## 2016-08-23 DIAGNOSIS — Z6832 Body mass index (BMI) 32.0-32.9, adult: Secondary | ICD-10-CM | POA: Diagnosis not present

## 2016-08-23 DIAGNOSIS — Z1331 Encounter for screening for depression: Secondary | ICD-10-CM

## 2016-08-23 DIAGNOSIS — R0600 Dyspnea, unspecified: Secondary | ICD-10-CM

## 2016-08-24 LAB — COMPREHENSIVE METABOLIC PANEL
ALK PHOS: 135 IU/L — AB (ref 39–117)
ALT: 21 IU/L (ref 0–32)
AST: 62 IU/L — ABNORMAL HIGH (ref 0–40)
Albumin/Globulin Ratio: 1.7 (ref 1.2–2.2)
Albumin: 4.3 g/dL (ref 3.5–5.5)
BILIRUBIN TOTAL: 0.3 mg/dL (ref 0.0–1.2)
BUN/Creatinine Ratio: 23 (ref 9–23)
BUN: 17 mg/dL (ref 6–24)
CHLORIDE: 101 mmol/L (ref 96–106)
CO2: 26 mmol/L (ref 20–29)
Calcium: 9.2 mg/dL (ref 8.7–10.2)
Creatinine, Ser: 0.73 mg/dL (ref 0.57–1.00)
GFR calc non Af Amer: 93 mL/min/{1.73_m2} (ref >59)
GFR, EST AFRICAN AMERICAN: 107 mL/min/{1.73_m2} (ref >59)
GLUCOSE: 90 mg/dL (ref 65–99)
Globulin, Total: 2.6 g/dL (ref 1.5–4.5)
Potassium: 4.3 mmol/L (ref 3.5–5.2)
Sodium: 141 mmol/L (ref 134–144)
TOTAL PROTEIN: 6.9 g/dL (ref 6.0–8.5)

## 2016-08-24 LAB — TSH: TSH: 0.892 u[IU]/mL (ref 0.450–4.500)

## 2016-08-24 LAB — LIPID PANEL WITH LDL/HDL RATIO
CHOLESTEROL TOTAL: 225 mg/dL — AB (ref 100–199)
HDL: 121 mg/dL (ref >39)
LDL Calculated: 91 mg/dL (ref 0–99)
LDl/HDL Ratio: 0.8 ratio (ref 0.0–3.2)
Triglycerides: 65 mg/dL (ref 0–149)
VLDL CHOLESTEROL CAL: 13 mg/dL (ref 5–40)

## 2016-08-24 LAB — HEMOGLOBIN A1C
ESTIMATED AVERAGE GLUCOSE: 108 mg/dL
HEMOGLOBIN A1C: 5.4 % (ref 4.8–5.6)

## 2016-08-24 LAB — T3: T3, Total: 94 ng/dL (ref 71–180)

## 2016-08-24 LAB — FOLATE

## 2016-08-24 LAB — T4, FREE: Free T4: 1.1 ng/dL (ref 0.82–1.77)

## 2016-08-24 LAB — VITAMIN B12: Vitamin B-12: 1584 pg/mL — ABNORMAL HIGH (ref 232–1245)

## 2016-08-24 LAB — INSULIN, RANDOM: INSULIN: 2.8 u[IU]/mL (ref 2.6–24.9)

## 2016-08-26 NOTE — Progress Notes (Signed)
Office: [phone]  /  Fax: [phone]   Dear Dr. Hassell Done,   Thank you for referring Suzanne Schaefer to our clinic. The following note includes my evaluation and treatment recommendations.  HPI:   Chief Complaint: OBESITY    Suzanne Schaefer has been referred by Isabel Caprice. Hassell Done, MD for consultation regarding her obesity and obesity related comorbidities.    Suzanne Schaefer (MR# [id number]) is a 56 y.o. female who presents on 08/26/2016 for obesity evaluation and treatment. Current BMI is Body mass index is 32.56 kg/m.Marland Kitchen Suzanne Schaefer had Gastric Sleeve in 2016. *CAPHIS@ heaviest preoporative weight was 274 lbs and she lost down to 168 lbs within 18 months. Suzanne Schaefer kept this weight off for about 6 months before starting to regain weight and has now regained 10 lbs from their lowest postoperative weight.     Yamili attended our information session and states she is currently in the action stage of change and ready to dedicate time achieving and maintaining a healthier weight. Suzanne Schaefer requests to join our [place] program to help manage their weight and relearn how to use their weight loss surgery to achieve improved health.    Suzanne Schaefer states her family eats meals together she struggles with family and or coworkers weight loss sabotage her desired weight loss is 44.5 lbs she has been heavy most of  her life she started gaining weight when she went on birth control pills her heaviest weight ever was 279 lbs. she is a picky eater and doesn't like to eat healthier foods  she has significant food cravings issues  she frequently makes poor food choices she has binge eating behaviors   Suzanne Schaefer feels her energy is lower than it should be. This has worsened with weight gain and has not worsened recently. Suzanne Schaefer admits to daytime somnolence and  admits to waking up still tired. Patient is at risk for obstructive sleep apnea. Patent has a history of symptoms of daytime Suzanne, morning Suzanne and  morning headache. Patient generally gets 7 hours of sleep per night, and states they generally have restless sleep. Snoring is present. Apneic episodes are not present. Epworth Sleepiness Score is 11  Suzanne Schaefer notes increasing shortness of breath with exercising and seems to be worsening over time with weight gain. She notes getting out of breath sooner with activity than she used to. This has not gotten worse recently. Suzanne Schaefer denies orthopnea.  Hyperglycemia Suzanne Schaefer has a history of some elevated blood glucose readings without a diagnosis of diabetes. She admits to polyphagia.  Depression Screen Suzanne Schaefer's Food and Mood (modified PHQ-9) score was  Depression screen PHQ 2/9 08/23/2016  Decreased Interest 1  Down, Depressed, Hopeless 1  PHQ - 2 Score 2  Altered sleeping 1  Tired, decreased energy 1  Change in appetite 1  Feeling bad or failure about yourself  1  Trouble concentrating 1  Moving slowly or fidgety/restless 0  Suicidal thoughts 1  PHQ-9 Score 8    ALLERGIES: Allergies  Allergen Reactions   Ciprofloxacin Nausea And Vomiting   Hydrocodone Nausea And Vomiting   Clindamycin/Lincomycin Rash    MEDICATIONS: Current Outpatient Prescriptions on File Prior to Visit  Medication Sig Dispense Refill   ALREX 0.2 % SUSP Place 1 drop into the left eye 2 (two) times daily as needed (irritation).   1   B Complex-Folic Acid (B COMPLEX-VITAMIN B12 PO) Take by mouth.     Calcium-Vitamin D-Vitamin K ([employer]S CALCIUM SOFT CHEWS PO) Take 1,200  mg by mouth daily.     Cetirizine-Pseudoephedrine (ZYRTEC-D PO) Take 1 tablet by mouth every morning.      Cholecalciferol (VITAMIN D-3) 1000 UNITS CAPS Take 2 capsules by mouth 3 (three) times a week.      fluticasone (FLONASE) 50 MCG/ACT nasal spray Place 2 sprays into both nostrils at bedtime.     guaiFENesin (MUCINEX) 600 MG 12 hr tablet Take 600 mg by mouth 2 (two) times daily.     magnesium oxide (MAG-OX) 400 MG tablet  Take 400 mg by mouth 3 (three) times a week. Mon,Wed,Fri     Misc Natural Products (HEALTHY LIVER PO) Take by mouth.     Misc Natural Products (HM JOINT HEALTH ULTRA PO) Take by mouth.     Multiple Minerals-Vitamins (CVS CALCIUM 600 PLUS) CHEW Chew 1 each by mouth daily.     Multiple Vitamins-Minerals (CENTRUM VITAMINTS PO) Take 1 each by mouth 2 (two) times daily.      pantoprazole (PROTONIX) 40 MG tablet Take 1 tablet (40 mg total) by mouth daily. 90 tablet 3   polyethylene glycol (MIRALAX / GLYCOLAX) packet Take 17 g by mouth at bedtime.     rOPINIRole (REQUIP) 0.25 MG tablet Take 3 tablets (0.75 mg total) by mouth at bedtime. 270 tablet 3   Current Facility-Administered Medications on File Prior to Visit  Medication Dose Route Frequency Provider Last Rate Last Dose   0.9 %  sodium chloride infusion  500 mL Intravenous Continuous Gatha Mayer, MD        PAST MEDICAL HISTORY: Past Medical History:  Diagnosis Date   Allergic rhinitis    uses OTC meds   Allergy    Arthritis    arthritis -knees ,ankles   Back pain    Complication of anesthesia    Constipation    Depression    Esophagitis    Fatty liver    Gallbladder problem    GERD (gastroesophageal reflux disease)    Gestational HTN    History of sinusitis    no current problems   Leg edema    Obesity    Obstructive sleep apnea    no cpap currently.   PONV (postoperative nausea and vomiting)    Restless leg syndrome    Sleep apnea    no cpap   Tonsillitis    nothing recent    PAST SURGICAL HISTORY: Past Surgical History:  Procedure Laterality Date   bilat salpingectomy     CERVICAL BIOPSY     CESAREAN SECTION     HEMORRHOID SURGERY     LAPAROSCOPIC CHOLECYSTECTOMY  1992   LAPAROSCOPIC GASTRIC SLEEVE RESECTION N/A 04/05/2014   Procedure: LAPAROSCOPIC GASTRIC SLEEVE RESECTION with hiatel hernia repair;  Surgeon: Pedro Earls, MD;  Location: [facility];  Service: General;   Laterality: N/A;   [facility] GASTROINTESTINAL ENDOSCOPY     UPPER GI ENDOSCOPY  04/05/2014   Procedure: UPPER GI ENDOSCOPY;  Surgeon: Pedro Earls, MD;  Location: [facility];  Service: General;;    SOCIAL HISTORY: Social History  Substance Use Topics   Smoking status: Never Smoker   Smokeless tobacco: Never Used   Alcohol use 0.0 oz/week     Comment: rare occasional use    FAMILY HISTORY: Family History  Problem Relation Age of Onset   Asthma Mother    COPD Mother    Cancer Mother 33       PERITONEAL    Allergies  Mother        ? seasonal   Stroke Mother    Thyroid disease Mother    Hypertension Father    Obesity Father    Glaucoma Father    Atrial fibrillation Father    Sleep apnea Father    Cancer Maternal Uncle        >20 colon polyps   Colon polyps Maternal Uncle    Cancer Paternal Uncle        blood cancer   Cancer Paternal Grandfather        lymphoma   Esophageal cancer Neg Hx    Rectal cancer Neg Hx    Stomach cancer Neg Hx    Colon cancer Neg Hx     ROS: Review of Systems  Constitutional: Positive for malaise/Suzanne.  HENT: Positive for congestion (nasal stuffiness) and sinus pain.        Hay Fever Dry Mouth Hoarseness  Eyes:       Vision Changes Wear Glasses or Contacts Floaters  Respiratory: Positive for shortness of breath (on exertion).   Cardiovascular:       Leg Cramping Very Cold Feet or Hands  Gastrointestinal: Positive for constipation.  Musculoskeletal: Positive for back pain and neck pain.       Neck Stiffness  Skin:       Dryness Hair or Nail Changes  Neurological: Positive for headaches.  Endo/Heme/Allergies: Bruises/bleeds easily (easy bruising).       Polyphagia   Psychiatric/Behavioral: The patient has insomnia.     PHYSICAL EXAM: Blood pressure 101/61, pulse (!) 55, temperature 97.9 F (36.6 C), temperature source Oral, height 5\' 2"  (1.575 m), weight 178 lb (80.7 kg),  SpO2 100 %. Body mass index is 32.56 kg/m. Physical Exam  Constitutional: She is oriented to person, place, and time. She appears well-developed and well-nourished.  Cardiovascular: Normal rate.   Pulmonary/Chest: Effort normal.  Musculoskeletal: Normal range of motion.  Neurological: She is oriented to person, place, and time.  Skin: Skin is warm and dry.  Psychiatric: She has a normal mood and affect. Her behavior is normal.  Vitals reviewed.   RECENT LABS AND TESTS: BMET    Component Value Date/Time   NA 139 08/15/2016 0803   K 4.3 08/15/2016 0803   CL 102 08/15/2016 0803   CO2 30 08/15/2016 0803   GLUCOSE 99 08/15/2016 0803   GLUCOSE 112 (H) 01/01/2006 0804   BUN 25 (H) 08/15/2016 0803   CREATININE 0.76 08/15/2016 0803   CALCIUM 9.5 08/15/2016 0803   GFRNONAA >60 02/25/2015 0425   GFRAA >60 02/25/2015 0425   Lab Results  Component Value Date   HGBA1C 5.4 08/31/2014   No results found for: INSULIN CBC    Component Value Date/Time   WBC 5.2 08/15/2016 0803   RBC 4.44 08/15/2016 0803   HGB 13.0 08/15/2016 0803   HCT 38.0 08/15/2016 0803   PLT 200.0 08/15/2016 0803   MCV 85.6 08/15/2016 0803   MCH 28.5 02/24/2015 0358   MCHC 34.3 08/15/2016 0803   RDW 13.5 08/15/2016 0803   LYMPHSABS 2.0 08/15/2016 0803   MONOABS 0.4 08/15/2016 0803   EOSABS 0.1 08/15/2016 0803   BASOSABS 0.0 08/15/2016 0803   Iron/TIBC/Ferritin/ %Sat    Component Value Date/Time   IRON 77 08/31/2014 0812   IRONPCTSAT 20.3 08/31/2014 0812   Lipid Panel     Component Value Date/Time   CHOL 211 (H) 02/19/2016 0801   TRIG 64.0 02/19/2016 0801   TRIG 100  01/01/2006 0804   HDL 101.60 02/19/2016 0801   CHOLHDL 2 02/19/2016 0801   VLDL 12.8 02/19/2016 0801   LDLCALC 97 02/19/2016 0801   LDLDIRECT 115.2 01/27/2013 0816   Hepatic Function Panel     Component Value Date/Time   PROT 7.0 08/15/2016 0803   ALBUMIN 4.0 08/15/2016 0803   AST 50 (H) 08/15/2016 0803   ALT 18 08/15/2016 0803     ALKPHOS 111 08/15/2016 0803   BILITOT 0.4 08/15/2016 0803   BILIDIR 0.0 08/15/2016 0803      Component Value Date/Time   TSH 1.29 08/15/2016 0803   TSH 1.05 02/19/2016 0801   TSH 0.84 03/21/2015 0801    ECG  shows NSR with a rate of 54 BPM INDIRECT CALORIMETER done today shows a VO2 of 230 and a REE of 1601. Her calculated basal metabolic rate is 1017 thus her basal metabolic rate is better than expected.    ASSESSMENT AND PLAN: Other Suzanne - Plan: EKG 12-Lead, Comprehensive metabolic panel, Lipid Panel With LDL/HDL Ratio, Vitamin B12, Folate, T3, T4, free, TSH  Suzanne on exertion  Hyperglycemia - Plan: Hemoglobin A1c, Insulin, random  Depression screening  Class 1 obesity with serious comorbidity and body mass index (BMI) of 32.0 to 32.9 in adult, unspecified obesity type  PLAN: Suzanne Ludella was informed that her Suzanne may be related to obesity, depression or many other causes. Labs will be ordered, and in the meanwhile Joliana has agreed to work on diet, exercise and weight loss to help with Suzanne. Proper sleep hygiene was discussed including the need for 7-8 hours of quality sleep each night. A sleep study was not ordered based on symptoms and Epworth score.  Suzanne on exertion Deem's shortness of breath appears to be obesity related and exercise induced. She has agreed to work on weight loss and gradually increase exercise to treat her exercise induced shortness of breath. If Maryagnes follows our instructions and loses weight without improvement of her shortness of breath, we will plan to refer to pulmonology. We will monitor this condition regularly. Shaniyah agrees to this plan.  Hyperglycemia Fasting labs will be obtained and results with be discussed with Suzanne Schaefer in 1 week at her follow up visit. In the meanwhile Alyda was started on a lower simple carbohydrate diet and will work on weight loss efforts.  Depression Screen Tamora had a mildly positive depression  screening. Depression is commonly associated with obesity and often results in emotional eating behaviors. We will monitor this closely and work on CBT to help improve the non-hunger eating patterns. Referral to Psychology may be required if no improvement is seen as she continues in our clinic.  Obesity Amabel is currently in the action stage of change and her goal is to continue with weight loss efforts. I recommend Cledith begin the structured treatment plan as follows:  She has agreed to keep a food journal with 1100 to 1300 calories and 75 grams of protein  Tekeyah has been instructed to eventually work up to a goal of 150 minutes of combined cardio and strengthening exercise per week for weight loss and overall health benefits. We discussed the following Behavioral Modification Strategies today: increasing lean protein intake and keep a strict food journal  Zamiyah has agreed to join our [employer] and follow up with our clinic in 1 week. She was informed of the importance of frequent follow up visits to maximize her success with intensive lifestyle modifications for her multiple health conditions. She was  informed we would discuss her lab results at her next visit unless there is a critical issue that needs to be addressed sooner. Reisha agreed to keep her next visit at the agreed upon time to discuss these results.  I, Doreene Nest, am acting as transcriptionist for Dennard Nip, MD  I have reviewed the above documentation for accuracy and completeness, and I agree with the above. -Dennard Nip, MD   OBESITY BEHAVIORAL INTERVENTION VISIT  Today's visit was # 1 out of 36.  Starting weight: 178 lbs Starting date: 08/23/16 Today's weight : 178 lbs Today's date: 08/23/2016 Total lbs lost to date: 0 (Patients must lose 7 lbs in the first 6 months to continue with counseling)   ASK: We discussed the diagnosis of obesity with Suzanne Schaefer today and Statia agreed to give [place]  permission to discuss obesity behavioral modification therapy today.  ASSESS: Cing has the diagnosis of obesity and her BMI today is 32.6 Yetunde is in the action stage of change   ADVISE: Jakiah was educated on the multiple health risks of obesity as well as the benefit of weight loss to improve her health. She was advised of the need for long term treatment and the importance of lifestyle modifications.  AGREE: Multiple dietary modification options and treatment options were discussed and  Aibhlinn agreed to keep a food journal with 1100 to 1300 calories and 75 grams of protein daily We discussed the following Behavioral Modification Strategies today: increasing lean protein intake and keep a strict food journal.

## 2016-08-27 ENCOUNTER — Ambulatory Visit (INDEPENDENT_AMBULATORY_CARE_PROVIDER_SITE_OTHER): Payer: 59 | Admitting: Physician Assistant

## 2016-08-30 ENCOUNTER — Ambulatory Visit (INDEPENDENT_AMBULATORY_CARE_PROVIDER_SITE_OTHER): Payer: 59 | Admitting: Family Medicine

## 2016-08-30 VITALS — BP 99/63 | HR 64 | Temp 98.1°F | Ht 62.0 in | Wt 179.0 lb

## 2016-08-30 DIAGNOSIS — Z6832 Body mass index (BMI) 32.0-32.9, adult: Secondary | ICD-10-CM

## 2016-08-30 DIAGNOSIS — E669 Obesity, unspecified: Secondary | ICD-10-CM | POA: Diagnosis not present

## 2016-08-30 DIAGNOSIS — R748 Abnormal levels of other serum enzymes: Secondary | ICD-10-CM | POA: Diagnosis not present

## 2016-08-30 MED FILL — rOPINIRole HCL 0.25 MG TABS: 0.25 | 90 days supply | Qty: 270 | Fill #2

## 2016-08-30 MED FILL — PANTOPRAZOLE SOD DR 40 MG T: 40 | 90 days supply | Qty: 90 | Fill #2

## 2016-09-02 NOTE — Progress Notes (Signed)
Office: [phone]  /  Fax: [phone]   HPI:   Chief Complaint: OBESITY Makell is here to discuss her progress with her obesity treatment plan. She is on the  keep a food journal with 1100 to 1300 calories and 75 grams of protein  and is following her eating plan approximately 85 % of the time. She states she is exercising on treadmill, in workout class and weights for  80 minutes 3 times per week. Lakitha did well with journaling but she struggled to eat all her calories. She is exercising regularly and did well with eating her protein intake. Her weight is 179 lb (81.2 kg) today and has had a weight gain of 1 pound over a period of 1 week since her last visit. She has gained 1 lb since starting treatment with [place].  Elevated vitamin B12 level Paddy is on a high dose of B12 drops daily and her level is over replaced. She denies chest pain, nausea, vomiting or diarrhea.  ALLERGIES: Allergies  Allergen Reactions   Ciprofloxacin Nausea And Vomiting   Hydrocodone Nausea And Vomiting   Clindamycin/Lincomycin Rash    MEDICATIONS: Current Outpatient Prescriptions on File Prior to Visit  Medication Sig Dispense Refill   ALREX 0.2 % SUSP Place 1 drop into the left eye 2 (two) times daily as needed (irritation).   1   Aspirin-Caffeine (BAYER BACK & BODY) 500-32.5 MG TABS Take by mouth.     B Complex-Folic Acid (B COMPLEX-VITAMIN B12 PO) Take 5,000 mcg by mouth once a week.     Calcium-Vitamin D-Vitamin K (CVS CALCIUM SOFT CHEWS PO) Take 1,200 mg by mouth daily.     Cetirizine-Pseudoephedrine (ZYRTEC-D PO) Take 1 tablet by mouth every morning.      Cholecalciferol (VITAMIN D-3) 1000 UNITS CAPS Take 2 capsules by mouth 3 (three) times a week.      fluticasone (FLONASE) 50 MCG/ACT nasal spray Place 2 sprays into both nostrils at bedtime.     guaiFENesin (MUCINEX) 600 MG 12 hr tablet Take 600 mg by mouth 2 (two) times daily.     Homeopathic Products (CVS LEG CRAMPS PAIN RELIEF PO)  Take by mouth daily as needed.     Ibuprofen (ADVIL) 200 MG CAPS Take by mouth every 6 (six) hours as needed.     magnesium oxide (MAG-OX) 400 MG tablet Take 400 mg by mouth 3 (three) times a week. Mon,Wed,Fri     Misc Natural Products (HEALTHY LIVER PO) Take by mouth.     Misc Natural Products (HM JOINT HEALTH ULTRA PO) Take by mouth.     Multiple Minerals-Vitamins (CVS CALCIUM 600 PLUS) CHEW Chew 1 each by mouth daily.     Multiple Vitamins-Minerals (CENTRUM VITAMINTS PO) Take 1 each by mouth 2 (two) times daily.      pantoprazole (PROTONIX) 40 MG tablet Take 1 tablet (40 mg total) by mouth daily. 90 tablet 3   polyethylene glycol (MIRALAX / GLYCOLAX) packet Take 17 g by mouth at bedtime.     rOPINIRole (REQUIP) 0.25 MG tablet Take 3 tablets (0.75 mg total) by mouth at bedtime. 270 tablet 3   Current Facility-Administered Medications on File Prior to Visit  Medication Dose Route Frequency Provider Last Rate Last Dose   0.9 %  sodium chloride infusion  500 mL Intravenous Continuous Gatha Mayer, MD        PAST MEDICAL HISTORY: Past Medical History:  Diagnosis Date   Allergic rhinitis    uses OTC meds  Allergy    Arthritis    arthritis -knees ,ankles   Back pain    Complication of anesthesia    Constipation    Depression    Esophagitis    Fatty liver    Gallbladder problem    GERD (gastroesophageal reflux disease)    Gestational HTN    History of sinusitis    no current problems   Leg edema    Obesity    Obstructive sleep apnea    no cpap currently.   PONV (postoperative nausea and vomiting)    Restless leg syndrome    Sleep apnea    no cpap   Tonsillitis    nothing recent    PAST SURGICAL HISTORY: Past Surgical History:  Procedure Laterality Date   bilat salpingectomy     CERVICAL BIOPSY     CESAREAN SECTION     HEMORRHOID SURGERY     LAPAROSCOPIC CHOLECYSTECTOMY  1992   LAPAROSCOPIC GASTRIC SLEEVE RESECTION N/A 04/05/2014     Procedure: LAPAROSCOPIC GASTRIC SLEEVE RESECTION with hiatel hernia repair;  Surgeon: Pedro Earls, MD;  Location: [facility];  Service: General;  Laterality: N/A;   [place]   UPPER GASTROINTESTINAL ENDOSCOPY     UPPER GI ENDOSCOPY  04/05/2014   Procedure: UPPER GI ENDOSCOPY;  Surgeon: Pedro Earls, MD;  Location: [facility];  Service: General;;    SOCIAL HISTORY: Social History  Substance Use Topics   Smoking status: Never Smoker   Smokeless tobacco: Never Used   Alcohol use 0.0 oz/week     Comment: rare occasional use    FAMILY HISTORY: Family History  Problem Relation Age of Onset   Asthma Mother    COPD Mother    Cancer Mother 58       PERITONEAL    Allergies Mother        ? seasonal   Stroke Mother    Thyroid disease Mother    Hypertension Father    Obesity Father    Glaucoma Father    Atrial fibrillation Father    Sleep apnea Father    Cancer Maternal Uncle        >20 colon polyps   Colon polyps Maternal Uncle    Cancer Paternal Uncle        blood cancer   Cancer Paternal Grandfather        lymphoma   Esophageal cancer Neg Hx    Rectal cancer Neg Hx    Stomach cancer Neg Hx    Colon cancer Neg Hx     ROS: Review of Systems  Constitutional: Negative for weight loss.  Cardiovascular: Negative for chest pain.  Gastrointestinal: Negative for diarrhea, nausea and vomiting.    PHYSICAL EXAM: Blood pressure 99/63, pulse 64, temperature 98.1 F (36.7 C), temperature source Oral, height 5\' 2"  (1.575 m), weight 179 lb (81.2 kg), SpO2 99 %. Body mass index is 32.74 kg/m. Physical Exam  Constitutional: She is oriented to person, place, and time. She appears well-developed and well-nourished.  Cardiovascular: Normal rate.   Pulmonary/Chest: Effort normal.  Musculoskeletal: Normal range of motion.  Neurological: She is oriented to person, place, and time.  Skin: Skin is warm and dry.  Psychiatric: She has a normal  mood and affect. Her behavior is normal.  Vitals reviewed.   RECENT LABS AND TESTS: BMET    Component Value Date/Time   NA 141 08/23/2016 1306   K 4.3 08/23/2016 1306   CL 101 08/23/2016 1306  CO2 26 08/23/2016 1306   GLUCOSE 90 08/23/2016 1306   GLUCOSE 99 08/15/2016 0803   GLUCOSE 112 (H) 01/01/2006 0804   BUN 17 08/23/2016 1306   CREATININE 0.73 08/23/2016 1306   CALCIUM 9.2 08/23/2016 1306   GFRNONAA 93 08/23/2016 1306   GFRAA 107 08/23/2016 1306   Lab Results  Component Value Date   HGBA1C 5.4 08/23/2016   HGBA1C 5.4 08/31/2014   HGBA1C 5.9 01/10/2009   Lab Results  Component Value Date   INSULIN 2.8 08/23/2016   CBC    Component Value Date/Time   WBC 5.2 08/15/2016 0803   RBC 4.44 08/15/2016 0803   HGB 13.0 08/15/2016 0803   HCT 38.0 08/15/2016 0803   PLT 200.0 08/15/2016 0803   MCV 85.6 08/15/2016 0803   MCH 28.5 02/24/2015 0358   MCHC 34.3 08/15/2016 0803   RDW 13.5 08/15/2016 0803   LYMPHSABS 2.0 08/15/2016 0803   MONOABS 0.4 08/15/2016 0803   EOSABS 0.1 08/15/2016 0803   BASOSABS 0.0 08/15/2016 0803   Iron/TIBC/Ferritin/ %Sat    Component Value Date/Time   IRON 77 08/31/2014 0812   IRONPCTSAT 20.3 08/31/2014 0812   Lipid Panel     Component Value Date/Time   CHOL 225 (H) 08/23/2016 1306   TRIG 65 08/23/2016 1306   TRIG 100 01/01/2006 0804   HDL 121 08/23/2016 1306   CHOLHDL 2 02/19/2016 0801   VLDL 12.8 02/19/2016 0801   LDLCALC 91 08/23/2016 1306   LDLDIRECT 115.2 01/27/2013 0816   Hepatic Function Panel     Component Value Date/Time   PROT 6.9 08/23/2016 1306   ALBUMIN 4.3 08/23/2016 1306   AST 62 (H) 08/23/2016 1306   ALT 21 08/23/2016 1306   ALKPHOS 135 (H) 08/23/2016 1306   BILITOT 0.3 08/23/2016 1306   BILIDIR 0.0 08/15/2016 0803      Component Value Date/Time   TSH 0.892 08/23/2016 1306   TSH 1.29 08/15/2016 0803   TSH 1.05 02/19/2016 0801    ASSESSMENT AND PLAN: Elevated vitamin B12 level  Class 1 obesity  without serious comorbidity with body mass index (BMI) of 32.0 to 32.9 in adult, unspecified obesity type  PLAN:  Elevated vitamin B12 level Aronda was advised that studies show that a higher level of vitamin B12 gives her no additional benefit and to decrease OTC vitamin B12 to 1 time per week to keep her at the high end of normal. Sonika agrees with this plan and will follow up as needed.   Obesity Kharizma is currently in the action stage of change. As such, her goal is to continue with weight loss efforts She has agreed to keep a food journal with 1100 to 1300 calories and 75+ grams of protein  Kashlynn has been instructed to work up to a goal of 150 minutes of combined cardio and strengthening exercise per week for weight loss and overall health benefits. We discussed the following Behavioral Modification Strategies today: no skipping meals, meal planning & cooking strategies and keeping healthy foods in the home.  Breckin has agreed to follow up with our clinic as needed as she continues her weight loss journey. She was informed of the importance of frequent follow up visits to maximize her success with intensive lifestyle modifications for her multiple health conditions.  I, Doreene Nest, am acting as transcriptionist for Dennard Nip, MD  I have reviewed the above documentation for accuracy and completeness, and I agree with the above. -Dennard Nip, MD   OBESITY BEHAVIORAL  INTERVENTION VISIT  Today's visit was # 2 out of 22.  Starting weight: 178 lbs Starting date: 08/23/16 Today's weight : 179 lbs Today's date: 08/30/2016 Total lbs lost to date: 0 (Patients must lose 7 lbs in the first 6 months to continue with counseling)   ASK: We discussed the diagnosis of obesity with Tana Coast today and Mira agreed to give [place] permission to discuss obesity behavioral modification therapy today.  ASSESS: Dayona has the diagnosis of obesity and her BMI today is 32.8 Trista is in the  action stage of change   ADVISE: Raelin was educated on the multiple health risks of obesity as well as the benefit of weight loss to improve her health. She was advised of the need for long term treatment and the importance of lifestyle modifications.  AGREE: Multiple dietary modification options and treatment options were discussed and  Lenix agreed to keep a food journal with 1100 to 1300 calories and 75+ grams of protein  We discussed the following Behavioral Modification Strategies today: no skipping meals, meal planning & cooking strategies and keeping healthy foods in the home.

## 2016-09-05 ENCOUNTER — Ambulatory Visit (INDEPENDENT_AMBULATORY_CARE_PROVIDER_SITE_OTHER): Payer: 59 | Admitting: Family Medicine

## 2016-09-05 VITALS — Ht 62.0 in | Wt 178.0 lb

## 2016-09-05 DIAGNOSIS — Z9189 Other specified personal risk factors, not elsewhere classified: Secondary | ICD-10-CM | POA: Diagnosis not present

## 2016-09-05 DIAGNOSIS — E669 Obesity, unspecified: Secondary | ICD-10-CM

## 2016-09-05 DIAGNOSIS — Z6832 Body mass index (BMI) 32.0-32.9, adult: Secondary | ICD-10-CM | POA: Diagnosis not present

## 2016-09-10 NOTE — Progress Notes (Signed)
   Office: [phone]  /  Fax: [phone]  [place] VISIT   Office: [phone]  /  Fax: [phone]  OBESITY BEHAVIORAL INTERVENTION VISIT  Today's visit was # 3 out of 22. (Back on Track #1)  Starting weight: 178 pounds Starting date: 08/23/16 Today's weight : Weight: 178 lb (80.7 kg)  Today's date: 09/05/16 Total lbs lost to date: 0 (Patients must lose 7 lbs in the first 6 months to continue with counseling)  PREVENTATIVE COUNSELING: Preethi is at high risk of developing multiple serious health conditions including uncontrolled diabetes, coronary artery disease, heart failure, sleep apnea, chronic pain, depression, obesity related cancers and more due to her weight. These risks have been discussed in depth and Mersadez has agreed to work on the underlying disease of obesity to decrease the risk of developing any and all of these obesity related disease  ASK: We discussed the diagnosis of obesity with Tana Coast today and Jolyne agreed to give [place] permission to discuss obesity behavioral modification therapy today.  ASSESS: Priyana has the diagnosis of obesity and her BMI today is 32.6 Kolbee is in the action stage of change   ADVISE: Lygia was educated on the multiple health risks of obesity as well as the benefit of weight loss to improve her health. She was advised of the need for long term treatment and the importance of lifestyle modifications.  AGREE: Multiple dietary modification options and treatment options were discussed and  Debraann agreed to keep a food journal with 1100 to 1300  calories and 75 grams of protein  We discussed the following Behavioral Modification Stratagies today: increasing lean protein intake, decreasing simple carbohydrates , increasing vegetables, decrease eating out, decrease snacking  and avoiding temptations.  We spent > than 50% of the 15 minute visit on the counseling as documented in the  note.

## 2016-09-12 ENCOUNTER — Ambulatory Visit (INDEPENDENT_AMBULATORY_CARE_PROVIDER_SITE_OTHER): Payer: 59 | Admitting: Family Medicine

## 2016-09-12 VITALS — Ht 62.0 in | Wt 179.0 lb

## 2016-09-12 DIAGNOSIS — Z9189 Other specified personal risk factors, not elsewhere classified: Secondary | ICD-10-CM | POA: Diagnosis not present

## 2016-09-12 DIAGNOSIS — E669 Obesity, unspecified: Secondary | ICD-10-CM

## 2016-09-12 DIAGNOSIS — Z9884 Bariatric surgery status: Secondary | ICD-10-CM | POA: Diagnosis not present

## 2016-09-12 DIAGNOSIS — Z6832 Body mass index (BMI) 32.0-32.9, adult: Secondary | ICD-10-CM

## 2016-09-16 NOTE — Progress Notes (Signed)
  Office: [phone]  /  Fax: [phone]  OBESITY AND PREVENTATIVE COUNSELING BEHAVIORAL INTERVENTION VISIT  Today's visit was # 4 out of 56, BOT# 2  Starting weight: 178 pounds Starting date: 08/23/16 Today's weight : Weight: 179 lb (81.2 kg)  Today's date: 09/12/16 Total lbs lost to date: 0 (1 lb weight gain)  (Patients must lose 7 lbs in the first 6 months to continue with counseling)  PREVENTATIVE COUNSELING: Barbi is at high risk of developing multiple serious health conditions including uncontrolled diabetes, coronary artery disease, heart failure, sleep apnea, chronic pain, depression, obesity related cancers and more due to her weight. These risks have been discussed in depth and Lowen has agreed to work on the underlying disease of obesity to decrease the risk of developing any and all of these obesity related disease.  Emotional eating and strategies to address emotional eating were discussed at length today. Emalynn agreed to implement these strategies as appropriate to assist her weight loss efforts. Although Liann's weight has increased by 1 pound she has had a 3 pound fat loss over the past 3 weeks.   ASK: We discussed the diagnosis of obesity with Tana Coast today and Gabrial agreed to give [place] permission to discuss obesity behavioral modification therapy today.  ASSESS: Rhylee has the diagnosis of obesity and her BMI today is 32.8 Aasha is in the action stage of change   ADVISE: Rilea was educated on the multiple health risks of obesity as well as the benefit of weight loss to improve her health. She was advised of the need for long term treatment and the importance of lifestyle modifications.  AGREE: Multiple dietary modification options and treatment options were discussed and  Tanecia agreed to keep a food journal with 1100 to 1300 calories and 75 + protein  We discussed the following Behavioral Modification Stratagies today: increasing lean protein intake, emotional  eating strategies, ways to avoid boredom eating and avoiding temptations.  We spent > than 50% of the 15 minute visit on the counseling as documented in the note.

## 2016-09-19 ENCOUNTER — Ambulatory Visit (INDEPENDENT_AMBULATORY_CARE_PROVIDER_SITE_OTHER): Payer: 59 | Admitting: Family Medicine

## 2016-09-19 VITALS — Ht 62.0 in | Wt 180.0 lb

## 2016-09-19 DIAGNOSIS — Z9884 Bariatric surgery status: Secondary | ICD-10-CM | POA: Diagnosis not present

## 2016-09-19 DIAGNOSIS — Z6833 Body mass index (BMI) 33.0-33.9, adult: Secondary | ICD-10-CM | POA: Diagnosis not present

## 2016-09-19 DIAGNOSIS — E669 Obesity, unspecified: Secondary | ICD-10-CM | POA: Diagnosis not present

## 2016-09-19 DIAGNOSIS — Z9189 Other specified personal risk factors, not elsewhere classified: Secondary | ICD-10-CM | POA: Diagnosis not present

## 2016-09-24 NOTE — Progress Notes (Signed)
  Office: [phone]  /  Fax: [phone]  OBESITY AND PREVENTATIVE COUNSELING BEHAVIORAL INTERVENTION VISIT  Today's visit was # 5 out of 38, BOT# 3  Starting weight: 178 lbs  Starting date: 08/23/16 Today's weight : Weight: 180 lb (81.6 kg)  Today's date:09/19/16 Total lbs lost to date: 0 (gain of 2 lbs) (Patients must lose 7 lbs in the first 6 months to continue with counseling)  PREVENTATIVE COUNSELING: Janine is at high risk of developing multiple serious health conditions including uncontrolled diabetes, coronary artery disease, heart failure, sleep apnea, chronic pain, depression, obesity related cancers and more due to her weight. These risks have been discussed in depth and Braylinn has agreed to work on the underlying disease of obesity to decrease the risk of developing any and all of these obesity related disease.  Dula reports she is eating at night to help stay awake. She is trying to journal but finds it difficult is doing a lot of guessing when she tries. She is eating Zaxby's a lot. She is drinking her water. Strategies for eating healthy in an unsupportive environment was discussed at length today. Focus on behavioral interventions for creating a supportive environment and minimizing temptations.   ASK: We discussed the diagnosis of obesity with Tana Coast today and Angelmarie agreed to give [place] permission to discuss obesity behavioral modification therapy today.  ASSESS: Tishara has the diagnosis of obesity and her BMI today is 33.0 Gwenna is in the action stage of change   ADVISE: Bertrice was educated on the multiple health risks of obesity as well as the benefit of weight loss to improve her health. She was advised of the need for long term treatment and the importance of lifestyle modifications.  AGREE: Multiple dietary modification options and treatment options were discussed and  Jamonica agreed to keep a food journal with 1100 to 1300 calories and 75+ protein  We discussed  the following Behavioral Modification Stratagies today: dealing with family or coworker sabotage, ways to avoid night time snacking and avoiding temptations.  We spent > than 50% of the 15 minute visit on the counseling as documented in the note.

## 2016-09-26 ENCOUNTER — Ambulatory Visit (INDEPENDENT_AMBULATORY_CARE_PROVIDER_SITE_OTHER): Payer: 59 | Admitting: Family Medicine

## 2016-09-26 VITALS — Ht 62.0 in | Wt 180.0 lb

## 2016-09-26 DIAGNOSIS — Z9884 Bariatric surgery status: Secondary | ICD-10-CM | POA: Diagnosis not present

## 2016-09-26 DIAGNOSIS — Z9189 Other specified personal risk factors, not elsewhere classified: Secondary | ICD-10-CM | POA: Diagnosis not present

## 2016-09-26 DIAGNOSIS — Z6832 Body mass index (BMI) 32.0-32.9, adult: Secondary | ICD-10-CM | POA: Diagnosis not present

## 2016-09-26 DIAGNOSIS — E669 Obesity, unspecified: Secondary | ICD-10-CM

## 2016-09-30 NOTE — Progress Notes (Signed)
  Office: [phone]  /  Fax: [phone]  OBESITY AND PREVENTATIVE COUNSELING BEHAVIORAL INTERVENTION VISIT  Today's visit was # 6 out of 79, BOT# 4  Starting weight: 178 lbs Starting date: 08/23/16 Today's weight : Weight: 180 lb (81.6 kg)  Today's date:09/26/16 Total lbs lost to date: 0 (patient continues to retain some fluid.) (Patients must lose 7 lbs in the first 6 months to continue with counseling)  PREVENTATIVE COUNSELING: Suzanne Schaefer is at high risk of developing multiple serious health conditions including uncontrolled diabetes, coronary artery disease, heart failure, sleep apnea, chronic pain, depression, obesity related cancers and more due to her weight. These risks have been discussed in depth and Suzanne Schaefer has agreed to work on the underlying disease of obesity to decrease the risk of developing any and all of these obesity related disease  Suzanne Schaefer is  making increased efforts not to skip meals and continue journaling however continues to guess a lot of her nutrient intake. Patient was provided with a vegetarian meal plan as an additional option. Strategies for eating healthy for the holidays, celebrations and vacations were discussed at length today. Behavior modification strategies for overcoming feelings of guilt that may accompany overeating were also discussed at length today.    ASK: We discussed the diagnosis of obesity with Suzanne Schaefer today and Suzanne Schaefer agreed to give [place] permission to discuss obesity behavioral modification therapy today.  ASSESS: Suzanne Schaefer has the diagnosis of obesity and her BMI today is 32.9 Suzanne Schaefer is in the action stage of change   ADVISE: Suzanne Schaefer was educated on the multiple health risks of obesity as well as the benefit of weight loss to improve her health. She was advised of the need for long term treatment and the importance of lifestyle modifications.  AGREE: Multiple dietary modification options and treatment options were discussed and  Suzanne Schaefer agreed to  keep a food journal with 100 to 1300 calories and 75+ protein and was provided with a vegetarian meal plan as an additional option.  We discussed the following Behavioral Modification Stratagies today: increasing lean protein intake, decreasing simple carbohydrates , work on meal planning and easy cooking plans, holiday eating strategies , emotional eating strategies and avoiding temptations.  We spent > than 50% of the 15 minute visit on the counseling as documented in the note.

## 2016-10-15 ENCOUNTER — Encounter: Payer: 59 | Attending: Surgery | Admitting: Registered"

## 2016-10-15 ENCOUNTER — Ambulatory Visit: Payer: 59 | Admitting: Skilled Nursing Facility1

## 2016-10-15 ENCOUNTER — Encounter: Payer: Self-pay | Admitting: Registered"

## 2016-10-15 DIAGNOSIS — Z9884 Bariatric surgery status: Secondary | ICD-10-CM | POA: Insufficient documentation

## 2016-10-15 DIAGNOSIS — E669 Obesity, unspecified: Secondary | ICD-10-CM

## 2016-10-15 DIAGNOSIS — Z713 Dietary counseling and surveillance: Secondary | ICD-10-CM | POA: Insufficient documentation

## 2016-10-15 NOTE — Patient Instructions (Addendum)
-   Track protein and fluid intake using Baritastic App.   - Take 3 calcium supplements per day at least 2 hrs away from each other.   - Increase physical activity to goal of 5 days/week.   - Aim to increase non-starchy vegetable intake.

## 2016-10-15 NOTE — Progress Notes (Signed)
Follow-up visit:  2.5 Years Post-Operative Sleeve Gastrectomy Surgery  Medical Nutrition Therapy:  Appt start time: 9:15 end time:  10:05  Primary concerns today: Post-operative Bariatric Surgery Nutrition Management.    Pt arrives today having gained 10.2 lbs from previous 6 months ago. Pt states she has joined [employer] and states she feels that she is eating more, having a hard time keeping up with the recommended 1100-1300 kcals/day including 75-85 g of protein/day. Pt states she is not seeing a difference and has her next appt on Thurs; may discontinue going after that appt. Pt states she was recommended not to drink her protein and to eat it. Pt states she wants to continue to lose and her weight loss goal is 150 lbs. According to pt's record, her lowest weight post-op was 168.8 lbs on 07/11/2015, 15 months post-op.   Pt has had the sleeve gastrectomy 2 years ago today. Pt states she needs a readjustment to lose more weight. Pt seems agitated at all questions asked and asked if Kathlee Nations and Magda Paganini were coming back. Pt states she sees her surgeon march 29th. Pt states she has a lot of cramps in her feet and legs at night, worse during the week when she is working out. Pt also states she can hear her heart beat at night when everything is quiet stating it is not fast or slow and there is no pain. Pt states after a walk she felt lightheaded and dizzy before her walk she had bacon and eggs.   Non scale victories: none stated  Surgery date: 04/04/2016 Surgery type: Sleeve gastrectomy Start weight at  [facility]: 269 lbs on 01/08/2014 Weight today: 184.2 lbs Weight change: 12.2 lbs gain from 04/04/2016 Total weight lost: 84.8 lbs loss Weight loss goal: 150 lbs    TANITA  BODY COMP RESULTS  04/04/2016 10/15/2016   BMI (kg/m^2) 30.8 32.6   Fat Mass (lbs) 63.6 72.4   Fat Free Mass (lbs) 110.4 111.8   Total Body Water (lbs) 77.8 79.4     Surgery date: 04/05/2014 Surgery type: Gastric  sleeve Start weight at [facility]: 269 lbs on 01/08/2014 Weight today: 168.8 lbs Weight change: 1.2 lbs  Total weight loss: 100.2 lbs  TANITA BODY COMP RESULTS  03/14/14 04/19/14 05/31/14 06/27/14 09/27/14 12/12/14 01/03/15 04/05/15 07/11/15  BMI (kg/m^2) 47.7 43.0 40.7 38.8 34.9 33.0 32.7 30.1 29.9  Fat Mass (lbs) 134.5 123.5 98.5 99.5 79.0 63.0 71.0 57.5 56.8  Fat Free Mass (lbs) 135 119.0 131.5 119.5 118.0 123.5 113.5 112.5 112  Total Body Water (lbs) 99 87.0 96.5 87.5 86.5 90.5 83.0 82.5 78.8                  Preferred Learning Style:   No preference indicated   Learning Readiness:   Ready  24-hr recall: B (6 AM): Iso100 protein powder (25g) with Fairlife milk (13g) or protein bar  Snk (AM): egg (6g) and bacon (3g)  L (PM): Mythonos-chicken skewer (7g), 1/2 philly sub with bread  Snk (PM): none D (PM): grilled chicken (21g) Snk (PM): yogurt bar (6g)  Fluid intake: water, 64+ oz Estimated total protein intake: 60-70 g  Medications:  See list  Supplementation: Bariatric Complete 2x/day, 2 Ca once a day; B complex liquid once/week, Vitamin D  Using straws: occasionally when driving Drinking while eating: no Hair loss: no Carbonated beverages: no N/V/D/C/GAS:no, no, no, yes: taking miralax daily or every other day to keep her regular, no Dumping syndrome: No Having you  been chewing well: yes Chewing/swallowing difficulties:  no Changes in vision: no Changes to mood/headaches:  no Hair loss/Changes to skin/Changes to nails: no Any difficulty focusing or concentrating: no Sweating:  no Dizziness/Lightheaded: when working out sometimes Palpitations: no Abdominal Pain: no  Recent physical activity:  [employer] [facility] MWF, yardwork and other activities occasionally (walking and kayaking)  Progress Towards Goal(s):  In progress.   Nutritional Diagnosis:  [place]-3.3 Overweight/obesity related to past poor dietary habits and  physical inactivity as evidenced by patient w/ recent sleeve gastrectomy surgery following dietary guidelines for continued weight loss.    Intervention:  Nutrition education/diet reinforcement Goals: - Track protein and fluid intake using Baritastic App.  - Take 3 calcium supplements per day at least 2 hrs away from each other.  - Increase physical activity to goal of 5 days/week.  - Aim to increase non-starchy vegetable intake.   Teaching Method Utilized:  Visual Auditory Hands on  Barriers to learning/adherence to lifestyle change: none  Demonstrated degree of understanding via:  Teach Back   Monitoring/Evaluation:  Dietary intake, exercise, and body weight.

## 2016-10-17 ENCOUNTER — Ambulatory Visit (INDEPENDENT_AMBULATORY_CARE_PROVIDER_SITE_OTHER): Payer: 59 | Admitting: Family Medicine

## 2016-10-17 ENCOUNTER — Telehealth (INDEPENDENT_AMBULATORY_CARE_PROVIDER_SITE_OTHER): Payer: Self-pay | Admitting: Family Medicine

## 2016-10-17 VITALS — BP 112/68 | HR 59 | Temp 97.9°F | Ht 62.0 in | Wt 181.0 lb

## 2016-10-17 DIAGNOSIS — Z6833 Body mass index (BMI) 33.0-33.9, adult: Secondary | ICD-10-CM

## 2016-10-17 DIAGNOSIS — F32A Depression, unspecified: Secondary | ICD-10-CM | POA: Insufficient documentation

## 2016-10-17 DIAGNOSIS — E669 Obesity, unspecified: Secondary | ICD-10-CM | POA: Diagnosis not present

## 2016-10-17 DIAGNOSIS — F3289 Other specified depressive episodes: Secondary | ICD-10-CM

## 2016-10-17 DIAGNOSIS — K5909 Other constipation: Secondary | ICD-10-CM | POA: Diagnosis not present

## 2016-10-17 DIAGNOSIS — F329 Major depressive disorder, single episode, unspecified: Secondary | ICD-10-CM | POA: Insufficient documentation

## 2016-10-17 MED ORDER — BUPROPION HCL ER (SR) 150 MG PO TB12: 150.0000 mg | ORAL_TABLET | Freq: Every day | ORAL | 0 refills | Status: DC

## 2016-10-17 MED FILL — BUPROPION SR 150 MG TABLET: 150 | 30 days supply | Qty: 30 | Fill #0

## 2016-10-17 NOTE — Progress Notes (Signed)
Office: [phone]  /  Fax: [phone]   HPI:   Chief Complaint: OBESITY Suzanne Schaefer is here to discuss her progress with her obesity treatment plan. She is on the  follow the Category 1 plan and is following her eating plan approximately 90 % of the time. She states she is exercising cardio and weights for 80 minutes 3 times per week. Suzanne Schaefer is maintaining weight mostly but she is starting to gain weight. Suzanne Schaefer is struggling to follow the plan very strictly and still notes mild cravings. Her weight is 181 lb (82.1 kg) today and she has had a weight gain of 1 pound over a period of 7 weeks since her last visit. She has gained 3 lbs since starting treatment with [place].  Constipation Suzanne Schaefer notes constipation for the last few weeks, worse since attempting weight loss. She states BM are less frequent and are not hard and painful. She denies hematochezia or melena. She is stable on Miralax as needed and has no further issues.  Depression with emotional eating behaviors Suzanne Schaefer is snacking more, even when she is not hungry. She is struggling to manage her weight. Suzanne Schaefer struggles with emotional eating and using food for comfort to the extent that it is negatively impacting her health. She often snacks when she is not hungry. Suzanne Schaefer sometimes feels she is out of control and then feels guilty that she made poor food choices. She has been working on behavior modification techniques to help reduce her emotional eating and has been somewhat successful. She shows no sign of suicidal or homicidal ideations.  Depression screen [facility] 2/9 10/15/2016 08/23/2016 07/11/2015 04/05/2015 01/03/2015  Decreased Interest 0 1 0 0 0  Down, Depressed, Hopeless 0 1 0 0 0  PHQ - 2 Score 0 2 0 0 0  Altered sleeping - 1 - - -  Tired, decreased energy - 1 - - -  Change in appetite - 1 - - -  Feeling bad or failure about yourself  - 1 - - -  Trouble concentrating - 1 - - -  Moving slowly or fidgety/restless - 0 - - -  Suicidal thoughts -  1 - - -  PHQ-9 Score - 8 - - -      ALLERGIES: Allergies  Allergen Reactions  . Ciprofloxacin Nausea And Vomiting  . Hydrocodone Nausea And Vomiting  . Clindamycin/Lincomycin Rash    MEDICATIONS: Current Outpatient Prescriptions on File Prior to Visit  Medication Sig Dispense Refill  . ALREX 0.2 % SUSP Place 1 drop into the left eye 2 (two) times daily as needed (irritation).   1  . Aspirin-Caffeine (BAYER BACK & BODY) 500-32.5 MG TABS Take by mouth.    . B Complex-Folic Acid (B COMPLEX-VITAMIN B12 PO) Take 5,000 mcg by mouth once a week.    . Calcium-Vitamin D-Vitamin K ([employer] CALCIUM SOFT CHEWS PO) Take 1,200 mg by mouth daily.    . Cetirizine-Pseudoephedrine (ZYRTEC-D PO) Take 1 tablet by mouth every morning.     . Cholecalciferol (VITAMIN D-3) 1000 UNITS CAPS Take 2 capsules by mouth 3 (three) times a week.     . fluticasone (FLONASE) 50 MCG/ACT nasal spray Place 2 sprays into both nostrils at bedtime.    Marland Kitchen guaiFENesin (MUCINEX) 600 MG 12 hr tablet Take 600 mg by mouth 2 (two) times daily.    . Homeopathic Products ([employer]S LEG CRAMPS PAIN RELIEF PO) Take by mouth daily as needed.    . Ibuprofen (ADVIL) 200 MG CAPS Take by  mouth every 6 (six) hours as needed.    . magnesium oxide (MAG-OX) 400 MG tablet Take 400 mg by mouth 3 (three) times a week. Mon,Wed,Fri    . Misc Natural Products (HEALTHY LIVER PO) Take by mouth.    . Misc Natural Products (HM JOINT HEALTH ULTRA PO) Take by mouth.    . Multiple Minerals-Vitamins ([employer]S CALCIUM 600 PLUS) CHEW Chew 1 each by mouth daily.    . Multiple Vitamins-Minerals (CENTRUM VITAMINTS PO) Take 1 each by mouth 2 (two) times daily.     . pantoprazole (PROTONIX) 40 MG tablet Take 1 tablet (40 mg total) by mouth daily. 90 tablet 3  . polyethylene glycol (MIRALAX / GLYCOLAX) packet Take 17 g by mouth at bedtime.    Marland Kitchen rOPINIRole (REQUIP) 0.25 MG tablet Take 3 tablets (0.75 mg total) by mouth at bedtime. 270 tablet 3   Current Facility-Administered  Medications on File Prior to Visit  Medication Dose Route Frequency Provider Last Rate Last Dose  . 0.9 %  sodium chloride infusion  500 mL Intravenous Continuous Gatha Mayer, MD        PAST MEDICAL HISTORY: Past Medical History:  Diagnosis Date  . Allergic rhinitis    uses OTC meds  . Allergy   . Arthritis    arthritis -knees ,ankles  . Back pain   . Complication of anesthesia   . Constipation   . Depression   . Esophagitis   . Fatty liver   . Gallbladder problem   . GERD (gastroesophageal reflux disease)   . Gestational HTN   . History of sinusitis    no current problems  . Leg edema   . Obesity   . Obstructive sleep apnea    no cpap currently.  Marland Kitchen PONV (postoperative nausea and vomiting)   . Restless leg syndrome   . Sleep apnea    no cpap  . Tonsillitis    nothing recent    PAST SURGICAL HISTORY: Past Surgical History:  Procedure Laterality Date  . bilat salpingectomy    . CERVICAL BIOPSY    . CESAREAN SECTION    . HEMORRHOID SURGERY    . LAPAROSCOPIC CHOLECYSTECTOMY  1992  . LAPAROSCOPIC GASTRIC SLEEVE RESECTION N/A 04/05/2014   Procedure: LAPAROSCOPIC GASTRIC SLEEVE RESECTION with hiatel hernia repair;  Surgeon: Pedro Earls, MD;  Location: [facility];  Service: General;  Laterality: N/A;  . [place]  . UPPER GASTROINTESTINAL ENDOSCOPY    . UPPER GI ENDOSCOPY  04/05/2014   Procedure: UPPER GI ENDOSCOPY;  Surgeon: Pedro Earls, MD;  Location: [facility];  Service: General;;    SOCIAL HISTORY: Social History  Substance Use Topics  . Smoking status: Never Smoker  . Smokeless tobacco: Never Used  . Alcohol use 0.0 oz/week     Comment: rare occasional use    FAMILY HISTORY: Family History  Problem Relation Age of Onset  . Asthma Mother   . COPD Mother   . Cancer Mother 37       PERITONEAL   . Allergies Mother        ? seasonal  . Stroke Mother   . Thyroid disease Mother   . Hypertension Father   . Obesity Father   . Glaucoma  Father   . Atrial fibrillation Father   . Sleep apnea Father   . Cancer Maternal Uncle        >20 colon polyps  . Colon polyps Maternal Uncle   .  Cancer Paternal Uncle        blood cancer  . Cancer Paternal Grandfather        lymphoma  . Esophageal cancer Neg Hx   . Rectal cancer Neg Hx   . Stomach cancer Neg Hx   . Colon cancer Neg Hx     ROS: Review of Systems  Constitutional: Negative for weight loss.  Psychiatric/Behavioral: Positive for depression. Negative for suicidal ideas.    PHYSICAL EXAM: Blood pressure 112/68, pulse (!) 59, temperature 97.9 F (36.6 C), temperature source Oral, height 5\' 2"  (1.575 m), weight 181 lb (82.1 kg), SpO2 100 %. Body mass index is 33.11 kg/m. Physical Exam  Constitutional: She is oriented to person, place, and time. She appears well-developed and well-nourished.  Cardiovascular: Normal rate.   Pulmonary/Chest: Effort normal.  Musculoskeletal: Normal range of motion.  Neurological: She is oriented to person, place, and time.  Skin: Skin is warm and dry.  Psychiatric: She has a normal mood and affect. Her behavior is normal.  Vitals reviewed.   RECENT LABS AND TESTS: BMET    Component Value Date/Time   NA 141 08/23/2016 1306   K 4.3 08/23/2016 1306   CL 101 08/23/2016 1306   CO2 26 08/23/2016 1306   GLUCOSE 90 08/23/2016 1306   GLUCOSE 99 08/15/2016 0803   GLUCOSE 112 (H) 01/01/2006 0804   BUN 17 08/23/2016 1306   CREATININE 0.73 08/23/2016 1306   CALCIUM 9.2 08/23/2016 1306   GFRNONAA 93 08/23/2016 1306   GFRAA 107 08/23/2016 1306   Lab Results  Component Value Date   HGBA1C 5.4 08/23/2016   HGBA1C 5.4 08/31/2014   HGBA1C 5.9 01/10/2009   Lab Results  Component Value Date   INSULIN 2.8 08/23/2016   CBC    Component Value Date/Time   WBC 5.2 08/15/2016 0803   RBC 4.44 08/15/2016 0803   HGB 13.0 08/15/2016 0803   HCT 38.0 08/15/2016 0803   PLT 200.0 08/15/2016 0803   MCV 85.6 08/15/2016 0803   MCH 28.5  02/24/2015 0358   MCHC 34.3 08/15/2016 0803   RDW 13.5 08/15/2016 0803   LYMPHSABS 2.0 08/15/2016 0803   MONOABS 0.4 08/15/2016 0803   EOSABS 0.1 08/15/2016 0803   BASOSABS 0.0 08/15/2016 0803   Iron/TIBC/Ferritin/ %Sat    Component Value Date/Time   IRON 77 08/31/2014 0812   IRONPCTSAT 20.3 08/31/2014 0812   Lipid Panel     Component Value Date/Time   CHOL 225 (H) 08/23/2016 1306   TRIG 65 08/23/2016 1306   TRIG 100 01/01/2006 0804   HDL 121 08/23/2016 1306   CHOLHDL 2 02/19/2016 0801   VLDL 12.8 02/19/2016 0801   LDLCALC 91 08/23/2016 1306   LDLDIRECT 115.2 01/27/2013 0816   Hepatic Function Panel     Component Value Date/Time   PROT 6.9 08/23/2016 1306   ALBUMIN 4.3 08/23/2016 1306   AST 62 (H) 08/23/2016 1306   ALT 21 08/23/2016 1306   ALKPHOS 135 (H) 08/23/2016 1306   BILITOT 0.3 08/23/2016 1306   BILIDIR 0.0 08/15/2016 0803      Component Value Date/Time   TSH 0.892 08/23/2016 1306   TSH 1.29 08/15/2016 0803   TSH 1.05 02/19/2016 0801    ASSESSMENT AND PLAN: Other depression - With emotional eating - Plan: buPROPion (WELLBUTRIN SR) 150 MG 12 hr tablet  Other constipation  Class 1 obesity with serious comorbidity and body mass index (BMI) of 33.0 to 33.9 in adult, unspecified obesity type  PLAN:  Constipation Kieli was informed decrease bowel movement frequency is normal while losing weight, but stools should not be hard or painful. She was advised to increase her H20 intake and work on increasing her fiber intake. High fiber foods were discussed today. Earnesteen agrees to follow up with our clinic in 3 weeks.  Depression with Emotional Eating Behaviors We discussed behavior modification techniques today to help Suzanne Schaefer deal with her emotional eating and depression. She has agreed to start to take Wellbutrin SR 150 mg every morning #30 with no refills and will follow up as directed.  Obesity Suzanne Schaefer is currently in the action stage of change. As such, her  goal is to continue with weight loss efforts She has agreed to follow the Category 1 plan Suzanne Schaefer has been instructed to work up to a goal of 150 minutes of combined cardio and strengthening exercise per week for weight loss and overall health benefits. We discussed the following Behavioral Modification Strategies today: better snacking choices, increasing lean protein intake, decreasing simple carbohydrates, work on meal planning and easy cooking plans and emotional eating strategies  Suzanne Schaefer has agreed to follow up with our clinic in 3 weeks. She was informed of the importance of frequent follow up visits to maximize her success with intensive lifestyle modifications for her multiple health conditions.  I, Suzanne Schaefer, am acting as transcriptionist for Suzanne Nip, MD  I have reviewed the above documentation for accuracy and completeness, and I agree with the above. -Suzanne Nip, MD   OBESITY BEHAVIORAL INTERVENTION VISIT  Today's visit was # 3 out of 22.  Starting weight: 178 lbs Starting date: 08/23/16 Today's weight : @181  lbs Today's date: 10/17/2016 Total lbs lost to date: 0 (Patients must lose 7 lbs in the first 6 months to continue with counseling)   ASK: We discussed the diagnosis of obesity with Suzanne Schaefer today and Suzanne Schaefer agreed to give [place] permission to discuss obesity behavioral modification therapy today.  ASSESS: Suzanne Schaefer has the diagnosis of obesity and her BMI today is 33.1 Suzanne Schaefer is in the action stage of change   ADVISE: Ceil was educated on the multiple health risks of obesity as well as the benefit of weight loss to improve her health. She was advised of the need for long term treatment and the importance of lifestyle modifications.  AGREE: Multiple dietary modification options and treatment options were discussed and  Gayathri agreed to follow the Category 1 plan We discussed the following Behavioral Modification Strategies today: better snacking choices,  increasing lean protein intake, decreasing simple carbohydrates, work on meal planning and easy cooking plans and emotional eating strategies

## 2016-10-17 NOTE — Telephone Encounter (Signed)
uPROPion (WELLBUTRIN SR) 150 MG 12 hr tablet Pharmacy [facility] / is this right wellbutrin , pharmacy says this on is usually twice a day, XL is usually once a day / pharmacy number is [phone]

## 2016-10-17 NOTE — Telephone Encounter (Signed)
Per Dr Leafy Ro,  Bupropion SR 150mg , once a day is correct,  called pharmacy and spoke to [facility] and confirmed rx that was sent is the correct one as well.   Thanks,  Kaisyn Reinhold

## 2016-11-05 ENCOUNTER — Ambulatory Visit (INDEPENDENT_AMBULATORY_CARE_PROVIDER_SITE_OTHER): Payer: 59 | Admitting: Family Medicine

## 2016-11-05 VITALS — BP 116/67 | HR 60 | Temp 97.9°F | Ht 62.0 in | Wt 181.0 lb

## 2016-11-05 DIAGNOSIS — K219 Gastro-esophageal reflux disease without esophagitis: Secondary | ICD-10-CM

## 2016-11-05 DIAGNOSIS — Z6833 Body mass index (BMI) 33.0-33.9, adult: Secondary | ICD-10-CM

## 2016-11-05 DIAGNOSIS — F3289 Other specified depressive episodes: Secondary | ICD-10-CM | POA: Diagnosis not present

## 2016-11-05 DIAGNOSIS — E669 Obesity, unspecified: Secondary | ICD-10-CM | POA: Diagnosis not present

## 2016-11-05 DIAGNOSIS — Z9189 Other specified personal risk factors, not elsewhere classified: Secondary | ICD-10-CM

## 2016-11-05 MED ORDER — BUPROPION HCL ER (SR) 200 MG PO TB12: 200.0000 mg | ORAL_TABLET | Freq: Every day | ORAL | 0 refills | Status: DC

## 2016-11-05 MED FILL — BUPROPION HCL SR 200 MG TAB: 200 | 30 days supply | Qty: 30 | Fill #0

## 2016-11-06 DIAGNOSIS — Z9884 Bariatric surgery status: Secondary | ICD-10-CM | POA: Diagnosis not present

## 2016-11-11 NOTE — Progress Notes (Signed)
Office: [phone]  /  Fax: [phone]   HPI:   Chief Complaint: OBESITY Suzanne Schaefer is here to discuss her progress with her obesity treatment plan. She is on the  follow the Category 1 plan and is following her eating plan approximately 85 % of the time. She states she is walking and exercising cardio and classes at the gym for 30 minutes 3 times per week. Suzanne Schaefer has done well maintaining weight but is struggling to lose additional weight on the category 1 plan. Her weight is 181 lb (82.1 kg) today and has maintained weight over a period of 3 weeks since her last visit. She has gained 3 lbs since starting treatment with [place].  Gastroesophageal Reflux Disease Suzanne Schaefer notes symptoms are stable and have started to improve with diet. She is on Protonix daily and has no history of Barrett's Esophagitis.  Depression with emotional eating behaviors Suzanne Schaefer started Wellbutrin and felt no improvement in emotional eating. Suzanne Schaefer struggles with emotional eating and using food for comfort to the extent that it is negatively impacting her health. She often snacks when she is not hungry. Suzanne Schaefer sometimes feels she is out of control and then feels guilty that she made poor food choices. She has been working on behavior modification techniques to help reduce her emotional eating and has been somewhat successful. She shows no sign of suicidal or homicidal ideations.  At risk for cardiovascular disease Suzanne Schaefer is at a higher than average risk for cardiovascular disease due to obesity. She currently denies any chest pain.  Depression screen Suzanne Schaefer 2/9 10/15/2016 08/23/2016 07/11/2015 04/05/2015 01/03/2015  Decreased Interest 0 1 0 0 0  Down, Depressed, Hopeless 0 1 0 0 0  PHQ - 2 Score 0 2 0 0 0  Altered sleeping - 1 - - -  Tired, decreased energy - 1 - - -  Change in appetite - 1 - - -  Feeling bad or failure about yourself  - 1 - - -  Trouble concentrating - 1 - - -  Moving slowly or fidgety/restless - 0 - - -  Suicidal  thoughts - 1 - - -  PHQ-9 Score - 8 - - -     ALLERGIES: Allergies  Allergen Reactions  . Ciprofloxacin Nausea And Vomiting  . Hydrocodone Nausea And Vomiting  . Clindamycin/Lincomycin Rash    MEDICATIONS: Current Outpatient Prescriptions on File Prior to Visit  Medication Sig Dispense Refill  . ALREX 0.2 % SUSP Place 1 drop into the left eye 2 (two) times daily as needed (irritation).   1  . Aspirin-Caffeine (BAYER BACK & BODY) 500-32.5 MG TABS Take by mouth.    . B Complex-Folic Acid (B COMPLEX-VITAMIN B12 PO) Take 5,000 mcg by mouth once a week.    . Calcium-Vitamin D-Vitamin K ([employer] CALCIUM SOFT CHEWS PO) Take 1,200 mg by mouth daily.    . Cetirizine-Pseudoephedrine (ZYRTEC-D PO) Take 1 tablet by mouth every morning.     . Cholecalciferol (VITAMIN D-3) 1000 UNITS CAPS Take 2 capsules by mouth 3 (three) times a week.     . fluticasone (FLONASE) 50 MCG/ACT nasal spray Place 2 sprays into both nostrils at bedtime.    Marland Kitchen guaiFENesin (MUCINEX) 600 MG 12 hr tablet Take 600 mg by mouth 2 (two) times daily.    . Homeopathic Products ([employer]S LEG CRAMPS PAIN RELIEF PO) Take by mouth daily as needed.    . Ibuprofen (ADVIL) 200 MG CAPS Take by mouth every 6 (six) hours as needed.    Marland Kitchen  magnesium oxide (MAG-OX) 400 MG tablet Take 400 mg by mouth 3 (three) times a week. Mon,Wed,Fri    . Misc Natural Products (HEALTHY LIVER PO) Take by mouth.    . Misc Natural Products (HM JOINT HEALTH ULTRA PO) Take by mouth.    . Multiple Minerals-Vitamins ([employer]S CALCIUM 600 PLUS) CHEW Chew 1 each by mouth daily.    . Multiple Vitamins-Minerals (CENTRUM VITAMINTS PO) Take 1 each by mouth 2 (two) times daily.     . pantoprazole (PROTONIX) 40 MG tablet Take 1 tablet (40 mg total) by mouth daily. 90 tablet 3  . polyethylene glycol (MIRALAX / GLYCOLAX) packet Take 17 g by mouth at bedtime.    Marland Kitchen rOPINIRole (REQUIP) 0.25 MG tablet Take 3 tablets (0.75 mg total) by mouth at bedtime. 270 tablet 3   Current  Facility-Administered Medications on File Prior to Visit  Medication Dose Route Frequency Provider Last Rate Last Dose  . 0.9 %  sodium chloride infusion  500 mL Intravenous Continuous Gatha Mayer, MD        PAST MEDICAL HISTORY: Past Medical History:  Diagnosis Date  . Allergic rhinitis    uses OTC meds  . Allergy   . Arthritis    arthritis -knees ,ankles  . Back pain   . Complication of anesthesia   . Constipation   . Depression   . Esophagitis   . Fatty liver   . Gallbladder problem   . GERD (gastroesophageal reflux disease)   . Gestational HTN   . History of sinusitis    no current problems  . Leg edema   . Obesity   . Obstructive sleep apnea    no cpap currently.  Marland Kitchen PONV (postoperative nausea and vomiting)   . Restless leg syndrome   . Sleep apnea    no cpap  . Tonsillitis    nothing recent    PAST SURGICAL HISTORY: Past Surgical History:  Procedure Laterality Date  . bilat salpingectomy    . CERVICAL BIOPSY    . CESAREAN SECTION    . HEMORRHOID SURGERY    . LAPAROSCOPIC CHOLECYSTECTOMY  1992  . LAPAROSCOPIC GASTRIC SLEEVE RESECTION N/A 04/05/2014   Procedure: LAPAROSCOPIC GASTRIC SLEEVE RESECTION with hiatel hernia repair;  Surgeon: Pedro Earls, MD;  Location: [facility];  Service: General;  Laterality: N/A;  . [place]  . UPPER GASTROINTESTINAL ENDOSCOPY    . UPPER GI ENDOSCOPY  04/05/2014   Procedure: UPPER GI ENDOSCOPY;  Surgeon: Pedro Earls, MD;  Location: [facility];  Service: General;;    SOCIAL HISTORY: Social History  Substance Use Topics  . Smoking status: Never Smoker  . Smokeless tobacco: Never Used  . Alcohol use 0.0 oz/week     Comment: rare occasional use    FAMILY HISTORY: Family History  Problem Relation Age of Onset  . Asthma Mother   . COPD Mother   . Cancer Mother 58       PERITONEAL   . Allergies Mother        ? seasonal  . Stroke Mother   . Thyroid disease Mother   . Hypertension Father   . Obesity  Father   . Glaucoma Father   . Atrial fibrillation Father   . Sleep apnea Father   . Cancer Maternal Uncle        >20 colon polyps  . Colon polyps Maternal Uncle   . Cancer Paternal Uncle  blood cancer  . Cancer Paternal Grandfather        lymphoma  . Esophageal cancer Neg Hx   . Rectal cancer Neg Hx   . Stomach cancer Neg Hx   . Colon cancer Neg Hx     ROS: Review of Systems  Constitutional: Negative for weight loss.  Cardiovascular: Negative for chest pain.  Psychiatric/Behavioral: Positive for depression. Negative for suicidal ideas.    PHYSICAL EXAM: Blood pressure 116/67, pulse 60, temperature 97.9 F (36.6 C), temperature source Oral, height 5\' 2"  (1.575 m), weight 181 lb (82.1 kg), SpO2 97 %. Body mass index is 33.11 kg/m. Physical Exam  Constitutional: She is oriented to person, place, and time. She appears well-developed and well-nourished.  Cardiovascular: Normal rate.   Pulmonary/Chest: Effort normal.  Musculoskeletal: Normal range of motion.  Neurological: She is oriented to person, place, and time.  Skin: Skin is warm and dry.  Psychiatric: She has a normal mood and affect. Her behavior is normal.  Vitals reviewed.   RECENT LABS AND TESTS: BMET    Component Value Date/Time   NA 141 08/23/2016 1306   K 4.3 08/23/2016 1306   CL 101 08/23/2016 1306   CO2 26 08/23/2016 1306   GLUCOSE 90 08/23/2016 1306   GLUCOSE 99 08/15/2016 0803   GLUCOSE 112 (H) 01/01/2006 0804   BUN 17 08/23/2016 1306   CREATININE 0.73 08/23/2016 1306   CALCIUM 9.2 08/23/2016 1306   GFRNONAA 93 08/23/2016 1306   GFRAA 107 08/23/2016 1306   Lab Results  Component Value Date   HGBA1C 5.4 08/23/2016   HGBA1C 5.4 08/31/2014   HGBA1C 5.9 01/10/2009   Lab Results  Component Value Date   INSULIN 2.8 08/23/2016   CBC    Component Value Date/Time   WBC 5.2 08/15/2016 0803   RBC 4.44 08/15/2016 0803   HGB 13.0 08/15/2016 0803   HCT 38.0 08/15/2016 0803   PLT 200.0  08/15/2016 0803   MCV 85.6 08/15/2016 0803   MCH 28.5 02/24/2015 0358   MCHC 34.3 08/15/2016 0803   RDW 13.5 08/15/2016 0803   LYMPHSABS 2.0 08/15/2016 0803   MONOABS 0.4 08/15/2016 0803   EOSABS 0.1 08/15/2016 0803   BASOSABS 0.0 08/15/2016 0803   Iron/TIBC/Ferritin/ %Sat    Component Value Date/Time   IRON 77 08/31/2014 0812   IRONPCTSAT 20.3 08/31/2014 0812   Lipid Panel     Component Value Date/Time   CHOL 225 (H) 08/23/2016 1306   TRIG 65 08/23/2016 1306   TRIG 100 01/01/2006 0804   HDL 121 08/23/2016 1306   CHOLHDL 2 02/19/2016 0801   VLDL 12.8 02/19/2016 0801   LDLCALC 91 08/23/2016 1306   LDLDIRECT 115.2 01/27/2013 0816   Hepatic Function Panel     Component Value Date/Time   PROT 6.9 08/23/2016 1306   ALBUMIN 4.3 08/23/2016 1306   AST 62 (H) 08/23/2016 1306   ALT 21 08/23/2016 1306   ALKPHOS 135 (H) 08/23/2016 1306   BILITOT 0.3 08/23/2016 1306   BILIDIR 0.0 08/15/2016 0803      Component Value Date/Time   TSH 0.892 08/23/2016 1306   TSH 1.29 08/15/2016 0803   TSH 1.05 02/19/2016 0801    ASSESSMENT AND PLAN: Gastroesophageal reflux disease, esophagitis presence not specified  Other depression - with emotional eating - Plan: buPROPion (WELLBUTRIN SR) 200 MG 12 hr tablet  At risk for heart disease  Class 1 obesity with serious comorbidity and body mass index (BMI) of 33.0 to 33.9 in  adult, unspecified obesity type  PLAN:  Gastroesophageal Reflux Disease Suzanne Schaefer agrees to continue with diet, exercise and weight loss and she may be able to decrease PPI eventually. She agrees to follow up with our clinic in 2 to 3 weeks.  Depression with Emotional Eating Behaviors We discussed behavior modification techniques today to help Suzanne Schaefer deal with her emotional eating and depression. She has agreed to increase Bupropion SR to 200 mg qam #30 with no refills and follow up with our clinic in 2 to 3 weeks.  Cardiovascular risk counseling Suzanne Schaefer was given extended  (15 minutes) coronary artery disease prevention counseling today. She is 56 y.o. female and has risk factors for heart disease including obesity. We discussed intensive lifestyle modifications today with an emphasis on specific weight loss instructions and strategies. Pt was also informed of the importance of increasing exercise and decreasing saturated fats to help prevent heart disease.  Obesity Suzanne Schaefer is currently in the action stage of change. As such, her goal is to continue with weight loss efforts She has agreed to change to follow a lower carbohydrate, vegetable and lean protein rich diet plan Suzanne Schaefer has been instructed to work up to a goal of 150 minutes of combined cardio and strengthening exercise per week for weight loss and overall health benefits. We discussed the following Behavioral Modification Strategies today: increasing lean protein intake, decreasing simple carbohydrates, work on meal planning and easy cooking plans and emotional eating strategies  Suzanne Schaefer has agreed to follow up with our clinic in 2 to 3 weeks. She was informed of the importance of frequent follow up visits to maximize her success with intensive lifestyle modifications for her multiple health conditions.  Suzanne Schaefer, Suzanne Schaefer, am acting as transcriptionist for Suzanne Nip, MD  Suzanne Schaefer have reviewed the above documentation for accuracy and completeness, and Suzanne Schaefer agree with the above. -Suzanne Nip, MD    OBESITY BEHAVIORAL INTERVENTION VISIT  Today's visit was # 4 out of 22.  Starting weight: 178 lbs Starting date: 08/23/16 Today's weight : 181 lbs  Today's date: 11/05/2016 Total lbs lost to date: 0 (Patients must lose 7 lbs in the first 6 months to continue with counseling)   ASK: We discussed the diagnosis of obesity with Suzanne Schaefer today and Suzanne Schaefer agreed to give [place] permission to discuss obesity behavioral modification therapy today.  ASSESS: Suzanne Schaefer has the diagnosis of obesity and her BMI today is  @33 .1 Suzanne Schaefer is in the action stage of change   ADVISE: Suzanne Schaefer was educated on the multiple health risks of obesity as well as the benefit of weight loss to improve her health. She was advised of the need for long term treatment and the importance of lifestyle modifications.  AGREE: Multiple dietary modification options and treatment options were discussed and  Suzanne Schaefer agreed to change to follow a lower carbohydrate, vegetable and lean protein rich diet plan We discussed the following Behavioral Modification Strategies today: increasing lean protein intake, decreasing simple carbohydrates, work on meal planning and easy cooking plans and emotional eating strategies

## 2016-11-27 MED FILL — PANTOPRAZOLE SOD DR 40 MG T: 40 | 90 days supply | Qty: 90 | Fill #3

## 2016-11-27 MED FILL — rOPINIRole HCL 0.25 MG TABS: 0.25 | 90 days supply | Qty: 270 | Fill #3

## 2016-11-28 ENCOUNTER — Ambulatory Visit (INDEPENDENT_AMBULATORY_CARE_PROVIDER_SITE_OTHER): Payer: 59 | Admitting: Family Medicine

## 2016-11-28 VITALS — BP 107/65 | HR 65 | Temp 98.0°F | Ht 62.0 in | Wt 180.0 lb

## 2016-11-28 DIAGNOSIS — Z6833 Body mass index (BMI) 33.0-33.9, adult: Secondary | ICD-10-CM | POA: Diagnosis not present

## 2016-11-28 DIAGNOSIS — E669 Obesity, unspecified: Secondary | ICD-10-CM

## 2016-11-28 DIAGNOSIS — E559 Vitamin D deficiency, unspecified: Secondary | ICD-10-CM | POA: Diagnosis not present

## 2016-11-28 DIAGNOSIS — F3289 Other specified depressive episodes: Secondary | ICD-10-CM | POA: Diagnosis not present

## 2016-11-28 DIAGNOSIS — Z9189 Other specified personal risk factors, not elsewhere classified: Secondary | ICD-10-CM | POA: Diagnosis not present

## 2016-11-28 MED ORDER — BUPROPION HCL ER (SR) 200 MG PO TB12: 200.0000 mg | ORAL_TABLET | Freq: Every day | ORAL | 0 refills | Status: DC

## 2016-11-28 NOTE — Progress Notes (Signed)
Office: [phone]  /  Fax: [phone]   HPI:   Chief Complaint: OBESITY Suzanne Schaefer is here to discuss her progress with her obesity treatment plan. She is on the follow a lower carbohydrate, vegetable and lean protein rich diet plan and is following her eating plan approximately 100 % of the time. She states she is doing 30 minutes cardio, 30 minutes group hope program 3 times per week. Suzanne Schaefer changed to the low carbohydrate plan but she is still drinking protein drinks and milk, so she is not getting the full benefit of a low carbohydrate plan.  Her weight is 180 lb (81.6 kg) today and has had a weight loss of 1 pound over a period of 3 weeks since her last visit. She has lost 0 lbs since starting treatment with [place].  Vitamin D deficiency Suzanne Schaefer has a diagnosis of vitamin D deficiency. She is currently taking OTC Vit D 1000-2000 daily and denies nausea, vomiting or muscle weakness.  At risk for osteopenia and osteoporosis Suzanne Schaefer is at higher risk of osteopenia and osteoporosis due to vitamin D deficiency.   Depression with emotional eating behaviors Suzanne Schaefer is struggling with emotional eating and using food for comfort to the extent that it is negatively impacting her health. She often snacks when she is not hungry. Suzanne Schaefer sometimes feels she is out of control and then feels guilty that she made poor food choices. She has been working on behavior modification techniques to help reduce her emotional eating and has been somewhat successful. Her mood is stable on Wellbutrin, she is doing better with meal planning and decreased comfort eating. Her blood pressure is stable. She denies palpitations or insomnia and she shows no sign of suicidal or homicidal ideations.  Depression screen [facility] 2/9 10/15/2016 08/23/2016 07/11/2015 04/05/2015 01/03/2015  Decreased Interest 0 1 0 0 0  Down, Depressed, Hopeless 0 1 0 0 0  PHQ - 2 Score 0 2 0 0 0  Altered sleeping - 1 - - -  Tired, decreased energy - 1 - - -    Change in appetite - 1 - - -  Feeling bad or failure about yourself  - 1 - - -  Trouble concentrating - 1 - - -  Moving slowly or fidgety/restless - 0 - - -  Suicidal thoughts - 1 - - -  PHQ-9 Score - 8 - - -       ALLERGIES: Allergies  Allergen Reactions  . Ciprofloxacin Nausea And Vomiting  . Hydrocodone Nausea And Vomiting  . Clindamycin/Lincomycin Rash    MEDICATIONS: Current Outpatient Prescriptions on File Prior to Visit  Medication Sig Dispense Refill  . ALREX 0.2 % SUSP Place 1 drop into the left eye 2 (two) times daily as needed (irritation).   1  . Aspirin-Caffeine (BAYER BACK & BODY) 500-32.5 MG TABS Take by mouth.    . B Complex-Folic Acid (B COMPLEX-VITAMIN B12 PO) Take 5,000 mcg by mouth once a week.    . Calcium-Vitamin D-Vitamin K ([employer] CALCIUM SOFT CHEWS PO) Take 1,200 mg by mouth daily.    . Cetirizine-Pseudoephedrine (ZYRTEC-D PO) Take 1 tablet by mouth every morning.     . Cholecalciferol (VITAMIN D-3) 1000 UNITS CAPS Take 2 capsules by mouth 3 (three) times a week.     . fluticasone (FLONASE) 50 MCG/ACT nasal spray Place 2 sprays into both nostrils at bedtime.    Marland Kitchen guaiFENesin (MUCINEX) 600 MG 12 hr tablet Take 600 mg by mouth 2 (two) times  daily.    . Homeopathic Products ([employer]S LEG CRAMPS PAIN RELIEF PO) Take by mouth daily as needed.    . Ibuprofen (ADVIL) 200 MG CAPS Take by mouth every 6 (six) hours as needed.    . magnesium oxide (MAG-OX) 400 MG tablet Take 400 mg by mouth 3 (three) times a week. Mon,Wed,Fri    . Misc Natural Products (HEALTHY LIVER PO) Take by mouth.    . Misc Natural Products (HM JOINT HEALTH ULTRA PO) Take by mouth.    . Multiple Minerals-Vitamins ([employer]S CALCIUM 600 PLUS) CHEW Chew 1 each by mouth daily.    . Multiple Vitamins-Minerals (CENTRUM VITAMINTS PO) Take 1 each by mouth 2 (two) times daily.     . pantoprazole (PROTONIX) 40 MG tablet Take 1 tablet (40 mg total) by mouth daily. 90 tablet 3  . polyethylene glycol (MIRALAX /  GLYCOLAX) packet Take 17 g by mouth at bedtime.    Marland Kitchen rOPINIRole (REQUIP) 0.25 MG tablet Take 3 tablets (0.75 mg total) by mouth at bedtime. 270 tablet 3   Current Facility-Administered Medications on File Prior to Visit  Medication Dose Route Frequency Provider Last Rate Last Dose  . 0.9 %  sodium chloride infusion  500 mL Intravenous Continuous Gatha Mayer, MD        PAST MEDICAL HISTORY: Past Medical History:  Diagnosis Date  . Allergic rhinitis    uses OTC meds  . Allergy   . Arthritis    arthritis -knees ,ankles  . Back pain   . Complication of anesthesia   . Constipation   . Depression   . Esophagitis   . Fatty liver   . Gallbladder problem   . GERD (gastroesophageal reflux disease)   . Gestational HTN   . History of sinusitis    no current problems  . Leg edema   . Obesity   . Obstructive sleep apnea    no cpap currently.  Marland Kitchen PONV (postoperative nausea and vomiting)   . Restless leg syndrome   . Sleep apnea    no cpap  . Tonsillitis    nothing recent    PAST SURGICAL HISTORY: Past Surgical History:  Procedure Laterality Date  . bilat salpingectomy    . CERVICAL BIOPSY    . CESAREAN SECTION    . HEMORRHOID SURGERY    . LAPAROSCOPIC CHOLECYSTECTOMY  1992  . LAPAROSCOPIC GASTRIC SLEEVE RESECTION N/A 04/05/2014   Procedure: LAPAROSCOPIC GASTRIC SLEEVE RESECTION with hiatel hernia repair;  Surgeon: Pedro Earls, MD;  Location: [facility];  Service: General;  Laterality: N/A;  . [place]  . UPPER GASTROINTESTINAL ENDOSCOPY    . UPPER GI ENDOSCOPY  04/05/2014   Procedure: UPPER GI ENDOSCOPY;  Surgeon: Pedro Earls, MD;  Location: [facility];  Service: General;;    SOCIAL HISTORY: Social History  Substance Use Topics  . Smoking status: Never Smoker  . Smokeless tobacco: Never Used  . Alcohol use 0.0 oz/week     Comment: rare occasional use    FAMILY HISTORY: Family History  Problem Relation Age of Onset  . Asthma Mother   . COPD Mother    . Cancer Mother 62       PERITONEAL   . Allergies Mother        ? seasonal  . Stroke Mother   . Thyroid disease Mother   . Hypertension Father   . Obesity Father   . Glaucoma Father   . Atrial fibrillation Father   .  Sleep apnea Father   . Cancer Maternal Uncle        >20 colon polyps  . Colon polyps Maternal Uncle   . Cancer Paternal Uncle        blood cancer  . Cancer Paternal Grandfather        lymphoma  . Esophageal cancer Neg Hx   . Rectal cancer Neg Hx   . Stomach cancer Neg Hx   . Colon cancer Neg Hx     ROS: Review of Systems  Constitutional: Positive for weight loss.  Cardiovascular: Negative for palpitations.  Gastrointestinal: Negative for nausea and vomiting.  Musculoskeletal:       Negative muscle weakness  Psychiatric/Behavioral: Positive for depression. Negative for suicidal ideas. The patient does not have insomnia.     PHYSICAL EXAM: Blood pressure 107/65, pulse 65, temperature 98 F (36.7 C), temperature source Oral, height 5\' 2"  (1.575 m), weight 180 lb (81.6 kg), SpO2 99 %. Body mass index is 32.92 kg/m. Physical Exam  Constitutional: She is oriented to person, place, and time. She appears well-developed and well-nourished.  Cardiovascular: Normal rate.   Pulmonary/Chest: Effort normal.  Musculoskeletal: Normal range of motion.  Neurological: She is oriented to person, place, and time.  Skin: Skin is warm and dry.  Psychiatric: She has a normal mood and affect.  Vitals reviewed.   RECENT LABS AND TESTS: BMET    Component Value Date/Time   NA 141 08/23/2016 1306   K 4.3 08/23/2016 1306   CL 101 08/23/2016 1306   CO2 26 08/23/2016 1306   GLUCOSE 90 08/23/2016 1306   GLUCOSE 99 08/15/2016 0803   GLUCOSE 112 (H) 01/01/2006 0804   BUN 17 08/23/2016 1306   CREATININE 0.73 08/23/2016 1306   CALCIUM 9.2 08/23/2016 1306   GFRNONAA 93 08/23/2016 1306   GFRAA 107 08/23/2016 1306   Lab Results  Component Value Date   HGBA1C 5.4  08/23/2016   HGBA1C 5.4 08/31/2014   HGBA1C 5.9 01/10/2009   Lab Results  Component Value Date   INSULIN 2.8 08/23/2016   CBC    Component Value Date/Time   WBC 5.2 08/15/2016 0803   RBC 4.44 08/15/2016 0803   HGB 13.0 08/15/2016 0803   HCT 38.0 08/15/2016 0803   PLT 200.0 08/15/2016 0803   MCV 85.6 08/15/2016 0803   MCH 28.5 02/24/2015 0358   MCHC 34.3 08/15/2016 0803   RDW 13.5 08/15/2016 0803   LYMPHSABS 2.0 08/15/2016 0803   MONOABS 0.4 08/15/2016 0803   EOSABS 0.1 08/15/2016 0803   BASOSABS 0.0 08/15/2016 0803   Iron/TIBC/Ferritin/ %Sat    Component Value Date/Time   IRON 77 08/31/2014 0812   IRONPCTSAT 20.3 08/31/2014 0812   Lipid Panel     Component Value Date/Time   CHOL 225 (H) 08/23/2016 1306   TRIG 65 08/23/2016 1306   TRIG 100 01/01/2006 0804   HDL 121 08/23/2016 1306   CHOLHDL 2 02/19/2016 0801   VLDL 12.8 02/19/2016 0801   LDLCALC 91 08/23/2016 1306   LDLDIRECT 115.2 01/27/2013 0816   Hepatic Function Panel     Component Value Date/Time   PROT 6.9 08/23/2016 1306   ALBUMIN 4.3 08/23/2016 1306   AST 62 (H) 08/23/2016 1306   ALT 21 08/23/2016 1306   ALKPHOS 135 (H) 08/23/2016 1306   BILITOT 0.3 08/23/2016 1306   BILIDIR 0.0 08/15/2016 0803      Component Value Date/Time   TSH 0.892 08/23/2016 1306   TSH 1.29 08/15/2016 0803  TSH 1.05 02/19/2016 0801    ASSESSMENT AND PLAN: Other depression - Plan: buPROPion (WELLBUTRIN SR) 200 MG 12 hr tablet  Vitamin D deficiency  At risk for osteoporosis  Class 1 obesity with serious comorbidity and body mass index (BMI) of 33.0 to 33.9 in adult, unspecified obesity type  PLAN:  Vitamin D Deficiency Suzanne Schaefer was informed that low vitamin D levels contributes to fatigue and are associated with obesity, breast, and colon cancer. Suzanne Schaefer agrees to continue taking OTC Vit D and will follow up for routine testing of vitamin D, at least 2-3 times per year. She was informed of the risk of over-replacement of  vitamin D and agrees to not increase her dose unless he discusses this with [place] first. We will recheck labs in 2 months and Suzanne Schaefer agrees to follow up with our clinic in 2 weeks.  At risk for osteopenia and osteoporosis Suzanne Schaefer is at risk for osteopenia and osteoporsis due to her vitamin D deficiency. She was encouraged to take her vitamin D and follow her higher calcium diet and increase strengthening exercise to help strengthen her bones and decrease her risk of osteopenia and osteoporosis.  Depression with Emotional Eating Behaviors We discussed behavior modification techniques today to help Suzanne Schaefer deal with her emotional eating and depression. Suzanne Schaefer agrees to continue taking Wellbutrin SR 200 mg q AM #30 and we will refill for 1 month. Suzanne Schaefer agrees to follow up with our clinic in 2 weeks.   Obesity Suzanne Schaefer is currently in the action stage of change. As such, her goal is to continue with weight loss efforts She has agreed to follow a lower carbohydrate, vegetable and lean protein rich diet plan Suzanne Schaefer has been instructed to work up to a goal of 150 minutes of combined cardio and strengthening exercise per week for weight loss and overall health benefits. We discussed the following Behavioral Modification Strategies today: increasing lean protein intake, decreasing simple carbohydrates, travel eating strategies, and holiday eating strategies (halloween)    Gricel has agreed to follow up with our clinic in 2 weeks. She was informed of the importance of frequent follow up visits to maximize her success with intensive lifestyle modifications for her multiple health conditions.  I, Trixie Dredge, am acting as transcriptionist for Dennard Nip, MD  I have reviewed the above documentation for accuracy and completeness, and I agree with the above. -Dennard Nip, MD     Today's visit was # 5 out of 22.  Starting weight: 178 lbs Starting date: 08/23/16 Today's weight : 180 lbs  Today's date:  11/28/2016 Total lbs lost to date: 0 (Patients must lose 7 lbs in the first 6 months to continue with counseling)   ASK: We discussed the diagnosis of obesity with Suzanne Schaefer today and Suzanne Schaefer agreed to give [place] permission to discuss obesity behavioral modification therapy today.  ASSESS: Suzanne Schaefer has the diagnosis of obesity and her BMI today is 32.91 Suzanne Schaefer is in the action stage of change   ADVISE: Suzanne Schaefer was educated on the multiple health risks of obesity as well as the benefit of weight loss to improve her health. She was advised of the need for long term treatment and the importance of lifestyle modifications.  AGREE: Multiple dietary modification options and treatment options were discussed and  Suzanne Schaefer agreed to follow a lower carbohydrate, vegetable and lean protein rich diet plan We discussed the following Behavioral Modification Strategies today: increasing lean protein intake, decreasing simple carbohydrates, travel eating strategies, and holiday eating  strategies (halloween)

## 2016-11-29 DIAGNOSIS — Z1231 Encounter for screening mammogram for malignant neoplasm of breast: Secondary | ICD-10-CM | POA: Diagnosis not present

## 2016-11-29 LAB — HM MAMMOGRAPHY

## 2016-12-04 ENCOUNTER — Encounter: Payer: Self-pay | Admitting: Internal Medicine

## 2016-12-10 ENCOUNTER — Ambulatory Visit (INDEPENDENT_AMBULATORY_CARE_PROVIDER_SITE_OTHER): Payer: 59 | Admitting: Family Medicine

## 2016-12-10 VITALS — BP 113/74 | HR 57 | Temp 97.9°F | Ht 62.0 in | Wt 180.0 lb

## 2016-12-10 DIAGNOSIS — R7989 Other specified abnormal findings of blood chemistry: Secondary | ICD-10-CM

## 2016-12-10 DIAGNOSIS — Z6832 Body mass index (BMI) 32.0-32.9, adult: Secondary | ICD-10-CM | POA: Diagnosis not present

## 2016-12-10 DIAGNOSIS — R7303 Prediabetes: Secondary | ICD-10-CM | POA: Diagnosis not present

## 2016-12-10 DIAGNOSIS — Z9884 Bariatric surgery status: Secondary | ICD-10-CM | POA: Diagnosis not present

## 2016-12-10 DIAGNOSIS — E538 Deficiency of other specified B group vitamins: Secondary | ICD-10-CM

## 2016-12-10 DIAGNOSIS — E669 Obesity, unspecified: Secondary | ICD-10-CM

## 2016-12-10 DIAGNOSIS — E559 Vitamin D deficiency, unspecified: Secondary | ICD-10-CM | POA: Diagnosis not present

## 2016-12-10 DIAGNOSIS — R945 Abnormal results of liver function studies: Secondary | ICD-10-CM | POA: Diagnosis not present

## 2016-12-10 DIAGNOSIS — R739 Hyperglycemia, unspecified: Secondary | ICD-10-CM | POA: Diagnosis not present

## 2016-12-10 DIAGNOSIS — R5383 Other fatigue: Secondary | ICD-10-CM | POA: Diagnosis not present

## 2016-12-10 DIAGNOSIS — Z9189 Other specified personal risk factors, not elsewhere classified: Secondary | ICD-10-CM

## 2016-12-10 MED FILL — BUPROPION HCL SR 200 MG TAB: 200 | 30 days supply | Qty: 30 | Fill #0

## 2016-12-11 LAB — COMPREHENSIVE METABOLIC PANEL
A/G RATIO: 1.8 (ref 1.2–2.2)
ALBUMIN: 4.3 g/dL (ref 3.5–5.5)
ALT: 17 IU/L (ref 0–32)
AST: 54 IU/L — ABNORMAL HIGH (ref 0–40)
Alkaline Phosphatase: 131 IU/L — ABNORMAL HIGH (ref 39–117)
BILIRUBIN TOTAL: 0.3 mg/dL (ref 0.0–1.2)
BUN / CREAT RATIO: 28 — AB (ref 9–23)
BUN: 18 mg/dL (ref 6–24)
CALCIUM: 9.2 mg/dL (ref 8.7–10.2)
CHLORIDE: 101 mmol/L (ref 96–106)
CO2: 27 mmol/L (ref 20–29)
Creatinine, Ser: 0.65 mg/dL (ref 0.57–1.00)
GFR, EST AFRICAN AMERICAN: 116 mL/min/{1.73_m2} (ref >59)
GFR, EST NON AFRICAN AMERICAN: 100 mL/min/{1.73_m2} (ref >59)
GLOBULIN, TOTAL: 2.4 g/dL (ref 1.5–4.5)
Glucose: 88 mg/dL (ref 65–99)
POTASSIUM: 4.3 mmol/L (ref 3.5–5.2)
SODIUM: 142 mmol/L (ref 134–144)
TOTAL PROTEIN: 6.7 g/dL (ref 6.0–8.5)

## 2016-12-11 LAB — LIPID PANEL WITH LDL/HDL RATIO
Cholesterol, Total: 218 mg/dL — ABNORMAL HIGH (ref 100–199)
HDL: 109 mg/dL (ref >39)
LDL CALC: 97 mg/dL (ref 0–99)
LDl/HDL Ratio: 0.9 ratio (ref 0.0–3.2)
Triglycerides: 62 mg/dL (ref 0–149)
VLDL CHOLESTEROL CAL: 12 mg/dL (ref 5–40)

## 2016-12-11 LAB — T3: T3, Total: 99 ng/dL (ref 71–180)

## 2016-12-11 LAB — VITAMIN B12: VITAMIN B 12: 1048 pg/mL (ref 232–1245)

## 2016-12-11 LAB — INSULIN, RANDOM: INSULIN: 7.1 u[IU]/mL (ref 2.6–24.9)

## 2016-12-11 LAB — TSH: TSH: 1.37 u[IU]/mL (ref 0.450–4.500)

## 2016-12-11 LAB — HEMOGLOBIN A1C
Est. average glucose Bld gHb Est-mCnc: 108 mg/dL
Hgb A1c MFr Bld: 5.4 % (ref 4.8–5.6)

## 2016-12-11 LAB — T4, FREE: Free T4: 1.19 ng/dL (ref 0.82–1.77)

## 2016-12-11 LAB — FOLATE

## 2017-01-01 ENCOUNTER — Ambulatory Visit (INDEPENDENT_AMBULATORY_CARE_PROVIDER_SITE_OTHER): Payer: 59 | Admitting: Family Medicine

## 2017-01-01 VITALS — BP 100/67 | HR 67 | Temp 97.8°F | Ht 62.0 in | Wt 180.0 lb

## 2017-01-01 DIAGNOSIS — F3289 Other specified depressive episodes: Secondary | ICD-10-CM

## 2017-01-01 DIAGNOSIS — E860 Dehydration: Secondary | ICD-10-CM

## 2017-01-01 DIAGNOSIS — Z6833 Body mass index (BMI) 33.0-33.9, adult: Secondary | ICD-10-CM | POA: Diagnosis not present

## 2017-01-01 DIAGNOSIS — E669 Obesity, unspecified: Secondary | ICD-10-CM | POA: Diagnosis not present

## 2017-01-01 MED ORDER — BUPROPION HCL ER (SR) 200 MG PO TB12: 200.0000 mg | ORAL_TABLET | Freq: Every day | ORAL | 0 refills | Status: DC

## 2017-01-01 NOTE — Progress Notes (Signed)
Office: [phone]  /  Fax: [phone]   HPI:   Chief Complaint: OBESITY Suzanne Schaefer is here to discuss her progress with her obesity treatment plan. She is on the lower carbohydrate, vegetable and lean protein rich diet plan and is following her eating plan approximately 70 % of the time. She states she is doing cardio, strength training, and weights for 80 minutes 3 times per week. [name] did well maintaining her weight over Thanksgiving. She is mostly doing portion control and smarter food choices, but she is not actually journaling as instructed.  Her weight is 180 lb (81.6 kg) today and has not lost weight since her last visit. She has lost 0 lbs since starting treatment with [place].  Dehydration BUN/Creatinine ratio increased, status post weight loss surgery in 2016. She is working on increasing H20, though she is only urinating 2 times daily.   Depression with emotional eating behaviors [name] is stable on Wellbutrin, she is missing some doses 2 to 3 times a week. [name] struggles with emotional eating and using food for comfort to the extent that it is negatively impacting her health. She often snacks when she is not hungry. [name] sometimes feels she is out of control and then feels guilty that she made poor food choices. She has been working on behavior modification techniques to help reduce her emotional eating and has been somewhat successful. She shows no sign of suicidal or homicidal ideations.  Depression screen [facility] 2/9 10/15/2016 08/23/2016 07/11/2015 04/05/2015 01/03/2015  Decreased Interest 0 1 0 0 0  Down, Depressed, Hopeless 0 1 0 0 0  PHQ - 2 Score 0 2 0 0 0  Altered sleeping - 1 - - -  Tired, decreased energy - 1 - - -  Change in appetite - 1 - - -  Feeling bad or failure about yourself  - 1 - - -  Trouble concentrating - 1 - - -  Moving slowly or fidgety/restless - 0 - - -  Suicidal thoughts - 1 - - -  PHQ-9 Score - 8 - - -   ALLERGIES: Allergies  Allergen Reactions  .  Ciprofloxacin Nausea And Vomiting  . Hydrocodone Nausea And Vomiting  . Clindamycin/Lincomycin Rash    MEDICATIONS: Current Outpatient Medications on File Prior to Visit  Medication Sig Dispense Refill  . ALREX 0.2 % SUSP Place 1 drop into the left eye 2 (two) times daily as needed (irritation).   1  . Aspirin-Caffeine (BAYER BACK & BODY) 500-32.5 MG TABS Take by mouth.    . B Complex-Folic Acid (B COMPLEX-VITAMIN B12 PO) Take 5,000 mcg by mouth once a week.    Marland Kitchen buPROPion (WELLBUTRIN SR) 200 MG 12 hr tablet Take 1 tablet (200 mg total) by mouth daily at 12 noon. 30 tablet 0  . Calcium-Vitamin D-Vitamin K ([employer] CALCIUM SOFT CHEWS PO) Take 1,200 mg by mouth daily.    . Cetirizine-Pseudoephedrine (ZYRTEC-D PO) Take 1 tablet by mouth every morning.     . Cholecalciferol (VITAMIN D-3) 1000 UNITS CAPS Take 2 capsules by mouth 3 (three) times a week.     . fluticasone (FLONASE) 50 MCG/ACT nasal spray Place 2 sprays into both nostrils at bedtime.    Marland Kitchen guaiFENesin (MUCINEX) 600 MG 12 hr tablet Take 600 mg by mouth 2 (two) times daily.    . Homeopathic Products ([employer] LEG CRAMPS PAIN RELIEF PO) Take by mouth daily as needed.    . Ibuprofen (ADVIL) 200 MG CAPS Take by mouth  every 6 (six) hours as needed.    . magnesium oxide (MAG-OX) 400 MG tablet Take 400 mg by mouth 3 (three) times a week. Mon,Wed,Fri    . Misc Natural Products (HEALTHY LIVER PO) Take by mouth.    . Misc Natural Products (HM JOINT HEALTH ULTRA PO) Take by mouth.    . Multiple Minerals-Vitamins ([employer] CALCIUM 600 PLUS) CHEW Chew 1 each by mouth daily.    . Multiple Vitamins-Minerals (CENTRUM VITAMINTS PO) Take 1 each by mouth 2 (two) times daily.     . pantoprazole (PROTONIX) 40 MG tablet Take 1 tablet (40 mg total) by mouth daily. 90 tablet 3  . polyethylene glycol (MIRALAX / GLYCOLAX) packet Take 17 g by mouth at bedtime.    Marland Kitchen rOPINIRole (REQUIP) 0.25 MG tablet Take 3 tablets (0.75 mg total) by mouth at bedtime. 270 tablet 3    Current Facility-Administered Medications on File Prior to Visit  Medication Dose Route Frequency Provider Last Rate Last Dose  . 0.9 %  sodium chloride infusion  500 mL Intravenous Continuous Gatha Mayer, MD        PAST MEDICAL HISTORY: Past Medical History:  Diagnosis Date  . Allergic rhinitis    uses OTC meds  . Allergy   . Arthritis    arthritis -knees ,ankles  . Back pain   . Complication of anesthesia   . Constipation   . Depression   . Esophagitis   . Fatty liver   . Gallbladder problem   . GERD (gastroesophageal reflux disease)   . Gestational HTN   . History of sinusitis    no current problems  . Leg edema   . Obesity   . Obstructive sleep apnea    no cpap currently.  Marland Kitchen PONV (postoperative nausea and vomiting)   . Restless leg syndrome   . Sleep apnea    no cpap  . Tonsillitis    nothing recent    PAST SURGICAL HISTORY: Past Surgical History:  Procedure Laterality Date  . bilat salpingectomy    . CERVICAL BIOPSY    . CESAREAN SECTION    . HEMORRHOID SURGERY    . LAPAROSCOPIC CHOLECYSTECTOMY  1992  . LAPAROSCOPIC GASTRIC SLEEVE RESECTION N/A 04/05/2014   Procedure: LAPAROSCOPIC GASTRIC SLEEVE RESECTION with hiatel hernia repair;  Surgeon: Pedro Earls, MD;  Location: [facility];  Service: General;  Laterality: N/A;  . [place]  . UPPER GASTROINTESTINAL ENDOSCOPY    . UPPER GI ENDOSCOPY  04/05/2014   Procedure: UPPER GI ENDOSCOPY;  Surgeon: Pedro Earls, MD;  Location: [facility];  Service: General;;    SOCIAL HISTORY: Social History   Tobacco Use  . Smoking status: Never Smoker  . Smokeless tobacco: Never Used  Substance Use Topics  . Alcohol use: Yes    Alcohol/week: 0.0 oz    Comment: rare occasional use  . Drug use: No    FAMILY HISTORY: Family History  Problem Relation Age of Onset  . Asthma Mother   . COPD Mother   . Cancer Mother 50       PERITONEAL   . Allergies Mother        ? seasonal  . Stroke Mother   .  Thyroid disease Mother   . Hypertension Father   . Obesity Father   . Glaucoma Father   . Atrial fibrillation Father   . Sleep apnea Father   . Cancer Maternal Uncle        >  20 colon polyps  . Colon polyps Maternal Uncle   . Cancer Paternal Uncle        blood cancer  . Cancer Paternal Grandfather        lymphoma  . Esophageal cancer Neg Hx   . Rectal cancer Neg Hx   . Stomach cancer Neg Hx   . Colon cancer Neg Hx     ROS: Review of Systems  Constitutional: Negative for weight loss.  Psychiatric/Behavioral: Positive for depression. Negative for suicidal ideas.    PHYSICAL EXAM: Blood pressure 100/67, pulse 67, temperature 97.8 F (36.6 C), temperature source Oral, height 5\' 2"  (1.575 m), weight 180 lb (81.6 kg), SpO2 99 %. Body mass index is 32.92 kg/m. Physical Exam  Constitutional: She is oriented to person, place, and time. She appears well-developed and well-nourished.  Cardiovascular: Normal rate.  Pulmonary/Chest: Effort normal.  Musculoskeletal: Normal range of motion.  Neurological: She is oriented to person, place, and time.  Skin: Skin is warm and dry.  Psychiatric: She has a normal mood and affect.  Vitals reviewed.   RECENT LABS AND TESTS: BMET    Component Value Date/Time   NA 142 12/10/2016 0928   K 4.3 12/10/2016 0928   CL 101 12/10/2016 0928   CO2 27 12/10/2016 0928   GLUCOSE 88 12/10/2016 0928   GLUCOSE 99 08/15/2016 0803   GLUCOSE 112 (H) 01/01/2006 0804   BUN 18 12/10/2016 0928   CREATININE 0.65 12/10/2016 0928   CALCIUM 9.2 12/10/2016 0928   GFRNONAA 100 12/10/2016 0928   GFRAA 116 12/10/2016 0928   Lab Results  Component Value Date   HGBA1C 5.4 12/10/2016   HGBA1C 5.4 08/23/2016   HGBA1C 5.4 08/31/2014   HGBA1C 5.9 01/10/2009   Lab Results  Component Value Date   INSULIN 7.1 12/10/2016   INSULIN 2.8 08/23/2016   CBC    Component Value Date/Time   WBC 5.2 08/15/2016 0803   RBC 4.44 08/15/2016 0803   HGB 13.0 08/15/2016 0803    HCT 38.0 08/15/2016 0803   PLT 200.0 08/15/2016 0803   MCV 85.6 08/15/2016 0803   MCH 28.5 02/24/2015 0358   MCHC 34.3 08/15/2016 0803   RDW 13.5 08/15/2016 0803   LYMPHSABS 2.0 08/15/2016 0803   MONOABS 0.4 08/15/2016 0803   EOSABS 0.1 08/15/2016 0803   BASOSABS 0.0 08/15/2016 0803   Iron/TIBC/Ferritin/ %Sat    Component Value Date/Time   IRON 77 08/31/2014 0812   IRONPCTSAT 20.3 08/31/2014 0812   Lipid Panel     Component Value Date/Time   CHOL 218 (H) 12/10/2016 0928   TRIG 62 12/10/2016 0928   TRIG 100 01/01/2006 0804   HDL 109 12/10/2016 0928   CHOLHDL 2 02/19/2016 0801   VLDL 12.8 02/19/2016 0801   LDLCALC 97 12/10/2016 0928   LDLDIRECT 115.2 01/27/2013 0816   Hepatic Function Panel     Component Value Date/Time   PROT 6.7 12/10/2016 0928   ALBUMIN 4.3 12/10/2016 0928   AST 54 (H) 12/10/2016 0928   ALT 17 12/10/2016 0928   ALKPHOS 131 (H) 12/10/2016 0928   BILITOT 0.3 12/10/2016 0928   BILIDIR 0.0 08/15/2016 0803      Component Value Date/Time   TSH 1.370 12/10/2016 0928   TSH 0.892 08/23/2016 1306   TSH 1.29 08/15/2016 0803    ASSESSMENT AND PLAN: Dehydration  Other depression - with emotional eating - Plan: buPROPion (WELLBUTRIN SR) 200 MG 12 hr tablet  Class 1 obesity with serious comorbidity and  body mass index (BMI) of 33.0 to 33.9 in adult, unspecified obesity type  PLAN:  Dehydration April agrees to increase H20 intake 80 to 100 ounces. She will follow up with our clinic in 3 to 4 weeks.  Depression with Emotional Eating Behaviors We discussed behavior modification techniques today to help Suzanne Schaefer deal with her emotional eating and depression. Suzanne Schaefer agrees to continue taking Wellbutrin SR 200 mg qd and we will refill for 1 month and she will work on ways to remember to take daily. Suzanne Schaefer agrees to follow up with our clinic in 3 to 4 weeks.  Obesity Suzanne Schaefer is currently in the action stage of change. As such, her goal is to continue with  weight loss efforts She has agreed to change to portion control better and make smarter food choices, such as increase vegetables and decrease simple carbohydrates  Suzanne Schaefer has been instructed to work up to a goal of 150 minutes of combined cardio and strengthening exercise per week for weight loss and overall health benefits. We discussed the following Behavioral Modification Strategies today: increasing lean protein intake, decreasing simple carbohydrates, and better snacking choices    Suzanne Schaefer has agreed to follow up with our clinic in 3 to 4 weeks. She was informed of the importance of frequent follow up visits to maximize her success with intensive lifestyle modifications for her multiple health conditions.  I, Trixie Dredge, am acting as transcriptionist for Dennard Nip, MD  I have reviewed the above documentation for accuracy and completeness, and I agree with the above. -Dennard Nip, MD     Today's visit was # 7 out of 22.  Starting weight: 178 lbs Starting date: 08/23/16 Today's weight : 180 lbs  Today's date: 01/01/2017 Total lbs lost to date: 0 (Patients must lose 7 lbs in the first 6 months to continue with counseling)   ASK: We discussed the diagnosis of obesity with Suzanne Schaefer today and Suzanne Schaefer agreed to give [place] permission to discuss obesity behavioral modification therapy today.  ASSESS: Armetta has the diagnosis of obesity and her BMI today is 32.91 Keiondra is in the action stage of change   ADVISE: Suzanne Schaefer was educated on the multiple health risks of obesity as well as the benefit of weight loss to improve her health. She was advised of the need for long term treatment and the importance of lifestyle modifications.  AGREE: Multiple dietary modification options and treatment options were discussed and  Suzanne Schaefer agreed to portion control better and make smarter food choices, such as increase vegetables and decrease simple carbohydrates  We discussed the following  Behavioral Modification Strategies today: increasing lean protein intake, decreasing simple carbohydrates, and better snacking choices

## 2017-01-02 NOTE — Progress Notes (Signed)
Office: [phone]  /  Fax: [phone]   HPI:   Chief Complaint: OBESITY Suzanne Schaefer is here to discuss her progress with her obesity treatment plan. She is on the lower carbohydrate, vegetable and lean protein rich diet plan and is following her eating plan approximately 80 % of the time. She states she is doing cardio and trainer at the gym for 45 minutes 3 times per week. Suzanne Schaefer has done well with maintaining her weight. She has had increased stress recently and not able to plan meals ahead of time.  Her weight is 180 lb (81.6 kg) today and has not lost weight since her last visit. She has lost 0 lbs since starting treatment with [place].  Elevated LFT Suzanne Schaefer has a diagnosis of elevated ALT. Her BMI is over 40. She denies abdominal pain or jaundice, likely due to NAFLD and she is due for lab recheck. She has been working on weight loss but has not been successful. She has never been told of any liver problems in the past and denies excessive alcohol intake.  Pre-Diabetes Suzanne Schaefer has a diagnosis of pre-diabetes based on her elevated Hgb A1c and was informed this puts her at greater risk of developing diabetes. She is not taking metformin currently and she is attempting to improve with diet. She has made dieting changes but has not lost weight. She denies nausea or hypoglycemia.  At risk for diabetes [name] is at higher than average risk for developing diabetes due to her obesity and pre-diabetes. She currently denies polyuria or polydipsia.  Vitamin D deficiency Suzanne Schaefer has a diagnosis of vitamin D deficiency. She is on OTC Vit D, not yet at goal previously and she is due for labs. She denies nausea, vomiting or muscle weakness.  Vitamin B12 Deficiency Suzanne Schaefer has a diagnosis of B12 insufficiency. She is on OTC supplement and would like to have labs rechecked. Also on B12 rich diet and no signs of pernicious anemia. She does not have a history of weight loss surgery.  Status Post Sleeve Gastrectomy    ALLERGIES: Allergies  Allergen Reactions  . Ciprofloxacin Nausea And Vomiting  . Hydrocodone Nausea And Vomiting  . Clindamycin/Lincomycin Rash    MEDICATIONS: Current Outpatient Medications on File Prior to Visit  Medication Sig Dispense Refill  . ALREX 0.2 % SUSP Place 1 drop into the left eye 2 (two) times daily as needed (irritation).   1  . Aspirin-Caffeine (BAYER BACK & BODY) 500-32.5 MG TABS Take by mouth.    . B Complex-Folic Acid (B COMPLEX-VITAMIN B12 PO) Take 5,000 mcg by mouth once a week.    . Calcium-Vitamin D-Vitamin K ([employer] CALCIUM SOFT CHEWS PO) Take 1,200 mg by mouth daily.    . Cetirizine-Pseudoephedrine (ZYRTEC-D PO) Take 1 tablet by mouth every morning.     . Cholecalciferol (VITAMIN D-3) 1000 UNITS CAPS Take 2 capsules by mouth 3 (three) times a week.     . fluticasone (FLONASE) 50 MCG/ACT nasal spray Place 2 sprays into both nostrils at bedtime.    Suzanne Schaefer guaiFENesin (MUCINEX) 600 MG 12 hr tablet Take 600 mg by mouth 2 (two) times daily.    . Homeopathic Products (CVS LEG CRAMPS PAIN RELIEF PO) Take by mouth daily as needed.    . Ibuprofen (ADVIL) 200 MG CAPS Take by mouth every 6 (six) hours as needed.    . magnesium oxide (MAG-OX) 400 MG tablet Take 400 mg by mouth 3 (three) times a week. Mon,Wed,Fri    . Misc  Natural Products (HEALTHY LIVER PO) Take by mouth.    . Misc Natural Products (HM JOINT HEALTH ULTRA PO) Take by mouth.    . Multiple Minerals-Vitamins ([employer]S CALCIUM 600 PLUS) CHEW Chew 1 each by mouth daily.    . Multiple Vitamins-Minerals (CENTRUM VITAMINTS PO) Take 1 each by mouth 2 (two) times daily.     . pantoprazole (PROTONIX) 40 MG tablet Take 1 tablet (40 mg total) by mouth daily. 90 tablet 3  . polyethylene glycol (MIRALAX / GLYCOLAX) packet Take 17 g by mouth at bedtime.    Suzanne Schaefer rOPINIRole (REQUIP) 0.25 MG tablet Take 3 tablets (0.75 mg total) by mouth at bedtime. 270 tablet 3   Current Facility-Administered Medications on File Prior to Visit   Medication Dose Route Frequency Provider Last Rate Last Dose  . 0.9 %  sodium chloride infusion  500 mL Intravenous Continuous Suzanne Mayer, MD        PAST MEDICAL HISTORY: Past Medical History:  Diagnosis Date  . Allergic rhinitis    uses OTC meds  . Allergy   . Arthritis    arthritis -knees ,ankles  . Back pain   . Complication of anesthesia   . Constipation   . Depression   . Esophagitis   . Fatty liver   . Gallbladder problem   . GERD (gastroesophageal reflux disease)   . Gestational HTN   . History of sinusitis    no current problems  . Leg edema   . Obesity   . Obstructive sleep apnea    no cpap currently.  Suzanne Schaefer PONV (postoperative nausea and vomiting)   . Restless leg syndrome   . Sleep apnea    no cpap  . Tonsillitis    nothing recent    PAST SURGICAL HISTORY: Past Surgical History:  Procedure Laterality Date  . bilat salpingectomy    . CERVICAL BIOPSY    . CESAREAN SECTION    . HEMORRHOID SURGERY    . LAPAROSCOPIC CHOLECYSTECTOMY  1992  . LAPAROSCOPIC GASTRIC SLEEVE RESECTION N/A 04/05/2014   Procedure: LAPAROSCOPIC GASTRIC SLEEVE RESECTION with hiatel hernia repair;  Surgeon: Pedro Earls, MD;  Location: [facility];  Service: General;  Laterality: N/A;  . [place]  . UPPER GASTROINTESTINAL ENDOSCOPY    . UPPER GI ENDOSCOPY  04/05/2014   Procedure: UPPER GI ENDOSCOPY;  Surgeon: Pedro Earls, MD;  Location: [facility];  Service: General;;    SOCIAL HISTORY: Social History   Tobacco Use  . Smoking status: Never Smoker  . Smokeless tobacco: Never Used  Substance Use Topics  . Alcohol use: Yes    Alcohol/week: 0.0 oz    Comment: rare occasional use  . Drug use: No    FAMILY HISTORY: Family History  Problem Relation Age of Onset  . Asthma Mother   . COPD Mother   . Cancer Mother 27       PERITONEAL   . Allergies Mother        ? seasonal  . Stroke Mother   . Thyroid disease Mother   . Hypertension Father   . Obesity Father    . Glaucoma Father   . Atrial fibrillation Father   . Sleep apnea Father   . Cancer Maternal Uncle        >20 colon polyps  . Colon polyps Maternal Uncle   . Cancer Paternal Uncle        blood cancer  . Cancer Paternal Grandfather  lymphoma  . Esophageal cancer Neg Hx   . Rectal cancer Neg Hx   . Stomach cancer Neg Hx   . Colon cancer Neg Hx     ROS: Review of Systems  Constitutional: Negative for weight loss.  Gastrointestinal: Negative for nausea and vomiting.  Genitourinary: Negative for frequency.  Musculoskeletal:       Negative muscle weakness  Endo/Heme/Allergies: Negative for polydipsia.       Negative hypoglycemia    PHYSICAL EXAM: Blood pressure 113/74, pulse (!) 57, temperature 97.9 F (36.6 C), temperature source Oral, height 5\' 2"  (1.575 m), weight 180 lb (81.6 kg), SpO2 98 %. Body mass index is 32.92 kg/m. Physical Exam  Constitutional: She is oriented to person, place, and time. She appears well-developed and well-nourished.  Cardiovascular: Normal rate.  Pulmonary/Chest: Effort normal.  Musculoskeletal: Normal range of motion.  Neurological: She is oriented to person, place, and time.  Skin: Skin is warm and dry.  Psychiatric: She has a normal mood and affect. Her behavior is normal.  Vitals reviewed.   RECENT LABS AND TESTS: BMET    Component Value Date/Time   NA 142 12/10/2016 0928   K 4.3 12/10/2016 0928   CL 101 12/10/2016 0928   CO2 27 12/10/2016 0928   GLUCOSE 88 12/10/2016 0928   GLUCOSE 99 08/15/2016 0803   GLUCOSE 112 (H) 01/01/2006 0804   BUN 18 12/10/2016 0928   CREATININE 0.65 12/10/2016 0928   CALCIUM 9.2 12/10/2016 0928   GFRNONAA 100 12/10/2016 0928   GFRAA 116 12/10/2016 0928   Lab Results  Component Value Date   HGBA1C 5.4 12/10/2016   HGBA1C 5.4 08/23/2016   HGBA1C 5.4 08/31/2014   HGBA1C 5.9 01/10/2009   Lab Results  Component Value Date   INSULIN 7.1 12/10/2016   INSULIN 2.8 08/23/2016   CBC     Component Value Date/Time   WBC 5.2 08/15/2016 0803   RBC 4.44 08/15/2016 0803   HGB 13.0 08/15/2016 0803   HCT 38.0 08/15/2016 0803   PLT 200.0 08/15/2016 0803   MCV 85.6 08/15/2016 0803   MCH 28.5 02/24/2015 0358   MCHC 34.3 08/15/2016 0803   RDW 13.5 08/15/2016 0803   LYMPHSABS 2.0 08/15/2016 0803   MONOABS 0.4 08/15/2016 0803   EOSABS 0.1 08/15/2016 0803   BASOSABS 0.0 08/15/2016 0803   Iron/TIBC/Ferritin/ %Sat    Component Value Date/Time   IRON 77 08/31/2014 0812   IRONPCTSAT 20.3 08/31/2014 0812   Lipid Panel     Component Value Date/Time   CHOL 218 (H) 12/10/2016 0928   TRIG 62 12/10/2016 0928   TRIG 100 01/01/2006 0804   HDL 109 12/10/2016 0928   CHOLHDL 2 02/19/2016 0801   VLDL 12.8 02/19/2016 0801   LDLCALC 97 12/10/2016 0928   LDLDIRECT 115.2 01/27/2013 0816   Hepatic Function Panel     Component Value Date/Time   PROT 6.7 12/10/2016 0928   ALBUMIN 4.3 12/10/2016 0928   AST 54 (H) 12/10/2016 0928   ALT 17 12/10/2016 0928   ALKPHOS 131 (H) 12/10/2016 0928   BILITOT 0.3 12/10/2016 0928   BILIDIR 0.0 08/15/2016 0803      Component Value Date/Time   TSH 1.370 12/10/2016 0928   TSH 0.892 08/23/2016 1306   TSH 1.29 08/15/2016 0803    ASSESSMENT AND PLAN: Elevated LFTs - Plan: Comprehensive metabolic panel  Prediabetes - Plan: Comprehensive metabolic panel, Hemoglobin A1c, Insulin, random  Vitamin D deficiency - Plan: VITAMIN D 25 Hydroxy (Vit-D Deficiency,  Fractures)  B12 nutritional deficiency - Plan: Vitamin B1, Vitamin B12  S/P laparoscopic sleeve gastrectomy  At risk for diabetes mellitus  Class 1 obesity with serious comorbidity and body mass index (BMI) of 32.0 to 32.9 in adult, unspecified obesity type  PLAN:  Elevated LFT We discussed the likely diagnosis of non alcoholic fatty liver disease today and how this condition is obesity related. Tamarra was educated on her risk of developing NASH or even liver failure and th only proven  treatment for NAFLD was weight loss. Loreli agreed to continue with her weight loss efforts with healthier diet and exercise as an essential part of her treatment plan. We will recheck labs and Keirra agrees to follow up with our clinic in 3 weeks.  Pre-Diabetes Dannelle will continue to work on weight loss, exercise, and decreasing simple carbohydrates in her diet to help decrease the risk of diabetes. We dicussed metformin including benefits and risks. She was informed that eating too many simple carbohydrates or too many calories at one sitting increases the likelihood of GI side effects. If there is no improvement, we will discuss starting metformin at next visit. We will recheck labs and Latiffany agrees to follow up with our clinic in 3 weeks as directed to monitor her progress.  Diabetes risk counselling Cheresa was given extended (15 minutes) diabetes prevention counseling today. She is 56 y.o. female and has risk factors for diabetes including obesity and pre-diabetes. We discussed intensive lifestyle modifications today with an emphasis on weight loss as well as increasing exercise and decreasing simple carbohydrates in her diet.  Vitamin D Deficiency Elany was informed that low vitamin D levels contributes to fatigue and are associated with obesity, breast, and colon cancer. She will continue taking OTC Vitamin D and will follow up for routine testing of vitamin D, at least 2-3 times per year. She was informed of the risk of over-replacement of vitamin D and agrees to not increase her dose unless he discusses this with [place] first. We will recheck labs and Nevia agrees to follow up with our clinic in 3 weeks.  Vitamin B12 Deficiency Jalaysia will work on increasing B12 rich foods in her diet. B12 supplementation was not prescribed today. We will recheck labs and Ashlye agrees to follow up with our clinic in 3 weeks.  Status Post Sleeve Gastrectomy  Obesity Marlyss is currently in the action stage of  change. As such, her goal is to continue with weight loss efforts She has agreed to follow a lower carbohydrate, vegetable and lean protein rich diet plan Ellysa has been instructed to work up to a goal of 150 minutes of combined cardio and strengthening exercise per week for weight loss and overall health benefits. Status post sleeve gastrectomy. We discussed the following Behavioral Modification Strategies today: increasing lean protein intake, decreasing simple carbohydrates, work on meal planning and easy cooking plans, holiday eating strategies, and keeping healthy foods in the home    Keyarah has agreed to follow up with our clinic in 3 weeks. She was informed of the importance of frequent follow up visits to maximize her success with intensive lifestyle modifications for her multiple health conditions.  I, Trixie Dredge, am acting as transcriptionist for Dennard Nip, MD  I have reviewed the above documentation for accuracy and completeness, and I agree with the above. -Dennard Nip, MD     Today's visit was # 7 out of 22.  Starting weight: 178 lbs Starting date: 08/23/16 Today's weight : 180  lbs  Today's date: 12/10/2016 Total lbs lost to date: 0 (Patients must lose 7 lbs in the first 6 months to continue with counseling)   ASK: We discussed the diagnosis of obesity with [place] today and Niomie agreed to give [place] permission to discuss obesity behavioral modification therapy today.  ASSESS: Dorotha has the diagnosis of obesity and her BMI today is 32.91 Mckinnley is in the action stage of change   ADVISE: Aayushi was educated on the multiple health risks of obesity as well as the benefit of weight loss to improve her health. She was advised of the need for long term treatment and the importance of lifestyle modifications.  AGREE: Multiple dietary modification options and treatment options were discussed and  Vitoria agreed to follow a lower carbohydrate, vegetable and lean  protein rich diet plan We discussed the following Behavioral Modification Strategies today: increasing lean protein intake, decreasing simple carbohydrates, work on meal planning and easy cooking plans, holiday eating strategies, and keeping healthy foods in the home

## 2017-01-21 ENCOUNTER — Ambulatory Visit (INDEPENDENT_AMBULATORY_CARE_PROVIDER_SITE_OTHER): Payer: 59 | Admitting: Family Medicine

## 2017-01-29 DIAGNOSIS — H524 Presbyopia: Secondary | ICD-10-CM | POA: Diagnosis not present

## 2017-02-13 ENCOUNTER — Ambulatory Visit (INDEPENDENT_AMBULATORY_CARE_PROVIDER_SITE_OTHER): Payer: 59 | Admitting: Obstetrics & Gynecology

## 2017-02-13 ENCOUNTER — Encounter: Payer: Self-pay | Admitting: Obstetrics & Gynecology

## 2017-02-13 VITALS — BP 124/86 | Ht 62.0 in | Wt 190.0 lb

## 2017-02-13 DIAGNOSIS — Z1151 Encounter for screening for human papillomavirus (HPV): Secondary | ICD-10-CM | POA: Diagnosis not present

## 2017-02-13 DIAGNOSIS — Z01419 Encounter for gynecological examination (general) (routine) without abnormal findings: Secondary | ICD-10-CM | POA: Diagnosis not present

## 2017-02-13 DIAGNOSIS — Z780 Asymptomatic menopausal state: Secondary | ICD-10-CM | POA: Diagnosis not present

## 2017-02-13 DIAGNOSIS — Z13820 Encounter for screening for osteoporosis: Secondary | ICD-10-CM

## 2017-02-13 NOTE — Patient Instructions (Signed)
1. Encounter for routine gynecological examination with Papanicolaou smear of cervix Normal gynecologic exam.  Pap with high risk HPV done.  Breast exam normal.  Screening mammogram October 2018 was negative.  Colonoscopy November 2017.  Health labs with family physician.  2. Menopause present Well on no hormone replacement therapy.  No postmenopausal bleeding.  Not sexually active.  3. Screening for osteoporosis Vitamin D supplements, calcium rich nutrition and regular weightbearing physical activity recommended.  Bone density to be done at [facility] ordered. - DG Bone Density; Future  Suzanne Schaefer, it was a pleasure meeting you today!  I will inform you of your results as soon as they are available.   Health Maintenance for Postmenopausal Women Menopause is a normal process in which your reproductive ability comes to an end. This process happens gradually over a span of months to years, usually between the ages of 15 and 16. Menopause is complete when you have missed 12 consecutive menstrual periods. It is important to talk with your health care provider about some of the most common conditions that affect postmenopausal women, such as heart disease, cancer, and bone loss (osteoporosis). Adopting a healthy lifestyle and getting preventive care can help to promote your health and wellness. Those actions can also lower your chances of developing some of these common conditions. What should I know about menopause? During menopause, you may experience a number of symptoms, such as:  Moderate-to-severe hot flashes.  Night sweats.  Decrease in sex drive.  Mood swings.  Headaches.  Tiredness.  Irritability.  Memory problems.  Insomnia.  Choosing to treat or not to treat menopausal changes is an individual decision that you make with your health care provider. What should I know about hormone replacement therapy and supplements? Hormone therapy products are effective for treating symptoms  that are associated with menopause, such as hot flashes and night sweats. Hormone replacement carries certain risks, especially as you become older. If you are thinking about using estrogen or estrogen with progestin treatments, discuss the benefits and risks with your health care provider. What should I know about heart disease and stroke? Heart disease, heart attack, and stroke become more likely as you age. This may be due, in part, to the hormonal changes that your body experiences during menopause. These can affect how your body processes dietary fats, triglycerides, and cholesterol. Heart attack and stroke are both medical emergencies. There are many things that you can do to help prevent heart disease and stroke:  Have your blood pressure checked at least every 1-2 years. High blood pressure causes heart disease and increases the risk of stroke.  If you are 36-77 years old, ask your health care provider if you should take aspirin to prevent a heart attack or a stroke.  Do not use any tobacco products, including cigarettes, chewing tobacco, or electronic cigarettes. If you need help quitting, ask your health care provider.  It is important to eat a healthy diet and maintain a healthy weight. ? Be sure to include plenty of vegetables, fruits, low-fat dairy products, and lean protein. ? Avoid eating foods that are high in solid fats, added sugars, or salt (sodium).  Get regular exercise. This is one of the most important things that you can do for your health. ? Try to exercise for at least 150 minutes each week. The type of exercise that you do should increase your heart rate and make you sweat. This is known as moderate-intensity exercise. ? Try to do strengthening exercises at  least twice each week. Do these in addition to the moderate-intensity exercise.  Know your numbers.Ask your health care provider to check your cholesterol and your blood glucose. Continue to have your blood tested as  directed by your health care provider.  What should I know about cancer screening? There are several types of cancer. Take the following steps to reduce your risk and to catch any cancer development as early as possible. Breast Cancer  Practice breast self-awareness. ? This means understanding how your breasts normally appear and feel. ? It also means doing regular breast self-exams. Let your health care provider know about any changes, no matter how small.  If you are 20 or older, have a clinician do a breast exam (clinical breast exam or CBE) every year. Depending on your age, family history, and medical history, it may be recommended that you also have a yearly breast X-ray (mammogram).  If you have a family history of breast cancer, talk with your health care provider about genetic screening.  If you are at high risk for breast cancer, talk with your health care provider about having an MRI and a mammogram every year.  Breast cancer (BRCA) gene test is recommended for women who have family members with BRCA-related cancers. Results of the assessment will determine the need for genetic counseling and BRCA1 and for BRCA2 testing. BRCA-related cancers include these types: ? Breast. This occurs in males or females. ? Ovarian. ? Tubal. This may also be called fallopian tube cancer. ? Cancer of the abdominal or pelvic lining (peritoneal cancer). ? Prostate. ? Pancreatic.  Cervical, Uterine, and Ovarian Cancer Your health care provider may recommend that you be screened regularly for cancer of the pelvic organs. These include your ovaries, uterus, and vagina. This screening involves a pelvic exam, which includes checking for microscopic changes to the surface of your cervix (Pap test).  For women ages 21-65, health care providers may recommend a pelvic exam and a Pap test every three years. For women ages 94-65, they may recommend the Pap test and pelvic exam, combined with testing for human  papilloma virus (HPV), every five years. Some types of HPV increase your risk of cervical cancer. Testing for HPV may also be done on women of any age who have unclear Pap test results.  Other health care providers may not recommend any screening for nonpregnant women who are considered low risk for pelvic cancer and have no symptoms. Ask your health care provider if a screening pelvic exam is right for you.  If you have had past treatment for cervical cancer or a condition that could lead to cancer, you need Pap tests and screening for cancer for at least 20 years after your treatment. If Pap tests have been discontinued for you, your risk factors (such as having a new sexual partner) need to be reassessed to determine if you should start having screenings again. Some women have medical problems that increase the chance of getting cervical cancer. In these cases, your health care provider may recommend that you have screening and Pap tests more often.  If you have a family history of uterine cancer or ovarian cancer, talk with your health care provider about genetic screening.  If you have vaginal bleeding after reaching menopause, tell your health care provider.  There are currently no reliable tests available to screen for ovarian cancer.  Lung Cancer Lung cancer screening is recommended for adults 22-55 years old who are at high risk for lung cancer  because of a history of smoking. A yearly low-dose CT scan of the lungs is recommended if you:  Currently smoke.  Have a history of at least 30 pack-years of smoking and you currently smoke or have quit within the past 15 years. A pack-year is smoking an average of one pack of cigarettes per day for one year.  Yearly screening should:  Continue until it has been 15 years since you quit.  Stop if you develop a health problem that would prevent you from having lung cancer treatment.  Colorectal Cancer  This type of cancer can be detected and  can often be prevented.  Routine colorectal cancer screening usually begins at age 84 and continues through age 67.  If you have risk factors for colon cancer, your health care provider may recommend that you be screened at an earlier age.  If you have a family history of colorectal cancer, talk with your health care provider about genetic screening.  Your health care provider may also recommend using home test kits to check for hidden blood in your stool.  A small camera at the end of a tube can be used to examine your colon directly (sigmoidoscopy or colonoscopy). This is done to check for the earliest forms of colorectal cancer.  Direct examination of the colon should be repeated every 5-10 years until age 1. However, if early forms of precancerous polyps or small growths are found or if you have a family history or genetic risk for colorectal cancer, you may need to be screened more often.  Skin Cancer  Check your skin from head to toe regularly.  Monitor any moles. Be sure to tell your health care provider: ? About any new moles or changes in moles, especially if there is a change in a mole's shape or color. ? If you have a mole that is larger than the size of a pencil eraser.  If any of your family members has a history of skin cancer, especially at a young age, talk with your health care provider about genetic screening.  Always use sunscreen. Apply sunscreen liberally and repeatedly throughout the day.  Whenever you are outside, protect yourself by wearing long sleeves, pants, a wide-brimmed hat, and sunglasses.  What should I know about osteoporosis? Osteoporosis is a condition in which bone destruction happens more quickly than new bone creation. After menopause, you may be at an increased risk for osteoporosis. To help prevent osteoporosis or the bone fractures that can happen because of osteoporosis, the following is recommended:  If you are 98-16 years old, get at least  1,000 mg of calcium and at least 600 mg of vitamin D per day.  If you are older than age 110 but younger than age 81, get at least 1,200 mg of calcium and at least 600 mg of vitamin D per day.  If you are older than age 45, get at least 1,200 mg of calcium and at least 800 mg of vitamin D per day.  Smoking and excessive alcohol intake increase the risk of osteoporosis. Eat foods that are rich in calcium and vitamin D, and do weight-bearing exercises several times each week as directed by your health care provider. What should I know about how menopause affects my mental health? Depression may occur at any age, but it is more common as you become older. Common symptoms of depression include:  Low or sad mood.  Changes in sleep patterns.  Changes in appetite or eating patterns.  Feeling an overall lack of motivation or enjoyment of activities that you previously enjoyed.  Frequent crying spells.  Talk with your health care provider if you think that you are experiencing depression. What should I know about immunizations? It is important that you get and maintain your immunizations. These include:  Tetanus, diphtheria, and pertussis (Tdap) booster vaccine.  Influenza every year before the flu season begins.  Pneumonia vaccine.  Shingles vaccine.  Your health care provider may also recommend other immunizations. This information is not intended to replace advice given to you by your health care provider. Make sure you discuss any questions you have with your health care provider. Document Released: 03/15/2005 Document Revised: 08/11/2015 Document Reviewed: 10/25/2014 Elsevier Interactive Patient Education  2018 [employer].

## 2017-02-13 NOTE — Progress Notes (Signed)
Suzanne Schaefer [birth year] [id number]   History:    57 y.o. G2P2 Divorced/single  2 children 53 and 60 yo.  Works at [employer].  RP:  Established patient presenting for annual gyn exam   HPI: Menopause, well without hormone replacement therapy.  No postmenopausal bleeding.  No pelvic pain.  Not sexually active.  Vaginal secretions normal.  Breasts normal.  Urine and bowel movements normal.  Body mass index 34.75.  Bone density ordered to be done at [facility].  Health labs with family physician.  Past medical history,surgical history, family history and social history were all reviewed and documented in the EPIC chart.  Gynecologic History No LMP recorded. Patient is not currently having periods (Reason: Perimenopausal). Contraception: abstinence and post menopausal status Last Pap: 02/2016. Results were: Negative Last mammogram: 11/2016. Results were: Negative [place] 12/2015 Bone Density 2015 [place]  Obstetric History OB History  Gravida Para Term Preterm AB Living  2 2       2   SAB TAB Ectopic Multiple Live Births               # Outcome Date GA Lbr Len/2nd Weight Sex Delivery Anes PTL Lv  2 Para           1 Para                ROS: A ROS was performed and pertinent positives and negatives are included in the history.  GENERAL: No fevers or chills. HEENT: No change in vision, no earache, sore throat or sinus congestion. NECK: No pain or stiffness. CARDIOVASCULAR: No chest pain or pressure. No palpitations. PULMONARY: No shortness of breath, cough or wheeze. GASTROINTESTINAL: No abdominal pain, nausea, vomiting or diarrhea, melena or bright red blood per rectum. GENITOURINARY: No urinary frequency, urgency, hesitancy or dysuria. MUSCULOSKELETAL: No joint or muscle pain, no back pain, no recent trauma. DERMATOLOGIC: No rash, no itching, no lesions. ENDOCRINE: No polyuria, polydipsia, no heat or cold intolerance. No recent change in weight. HEMATOLOGICAL: No anemia or easy bruising or  bleeding. NEUROLOGIC: No headache, seizures, numbness, tingling or weakness. PSYCHIATRIC: No depression, no loss of interest in normal activity or change in sleep pattern.     Exam:   BP 124/86   Ht 5\' 2"  (1.575 m)   Wt 190 lb (86.2 kg)   BMI 34.75 kg/m   Body mass index is 34.75 kg/m.  General appearance : Well developed well nourished female. No acute distress HEENT: Eyes: no retinal hemorrhage or exudates,  Neck supple, trachea midline, no carotid bruits, no thyroidmegaly Lungs: Clear to auscultation, no rhonchi or wheezes, or rib retractions  Heart: Regular rate and rhythm, no murmurs or gallops Breast:Examined in sitting and supine position were symmetrical in appearance, no palpable masses or tenderness,  no skin retraction, no nipple inversion, no nipple discharge, no skin discoloration, no axillary or supraclavicular lymphadenopathy Abdomen: no palpable masses or tenderness, no rebound or guarding Extremities: no edema or skin discoloration or tenderness  Pelvic: Vulva normal with atrophy of menopause  Bartholin, Urethra, Skene Glands: Within normal limits             Vagina: No gross lesions or discharge  Cervix: No gross lesions or discharge.  Pap/HPV HR done.  Uterus  AV, normal size, shape and consistency, non-tender and mobile  Adnexa  Without masses or tenderness  Anus and perineum  normal     Assessment/Plan:  57 y.o. female for annual exam   1. Encounter  for routine gynecological examination with Papanicolaou smear of cervix Normal gynecologic exam.  Pap with high risk HPV done.  Breast exam normal.  Screening mammogram October 2018 was negative.  Colonoscopy November 2017.  Health labs with family physician.  2. Menopause present Well on no hormone replacement therapy.  No postmenopausal bleeding.  Not sexually active.  3. Screening for osteoporosis Vitamin D supplements, calcium rich nutrition and regular weightbearing physical activity recommended.  Bone  density to be done at [facility] ordered. - DG Bone Density; Future  Princess Bruins MD, 8:48 AM 02/13/2017

## 2017-02-17 LAB — PAP, TP IMAGING W/ HPV RNA, RFLX HPV TYPE 16,18/45: HPV DNA High Risk: NOT DETECTED

## 2017-02-18 ENCOUNTER — Encounter: Payer: Self-pay | Admitting: Internal Medicine

## 2017-02-18 ENCOUNTER — Ambulatory Visit (INDEPENDENT_AMBULATORY_CARE_PROVIDER_SITE_OTHER): Payer: 59 | Admitting: Internal Medicine

## 2017-02-18 DIAGNOSIS — D509 Iron deficiency anemia, unspecified: Secondary | ICD-10-CM

## 2017-02-18 DIAGNOSIS — Z6832 Body mass index (BMI) 32.0-32.9, adult: Secondary | ICD-10-CM

## 2017-02-18 DIAGNOSIS — E669 Obesity, unspecified: Secondary | ICD-10-CM | POA: Diagnosis not present

## 2017-02-18 DIAGNOSIS — R740 Nonspecific elevation of levels of transaminase and lactic acid dehydrogenase [LDH]: Secondary | ICD-10-CM

## 2017-02-18 DIAGNOSIS — E559 Vitamin D deficiency, unspecified: Secondary | ICD-10-CM

## 2017-02-18 DIAGNOSIS — R7401 Elevation of levels of liver transaminase levels: Secondary | ICD-10-CM

## 2017-02-18 DIAGNOSIS — Z0000 Encounter for general adult medical examination without abnormal findings: Secondary | ICD-10-CM

## 2017-02-18 DIAGNOSIS — R7303 Prediabetes: Secondary | ICD-10-CM | POA: Diagnosis not present

## 2017-02-18 MED ORDER — ROPINIROLE HCL 0.25 MG PO TABS: 0.7500 mg | ORAL_TABLET | Freq: Every day | ORAL | 3 refills | Status: DC

## 2017-02-18 MED ORDER — PANTOPRAZOLE SODIUM 40 MG PO TBEC: 40.0000 mg | DELAYED_RELEASE_TABLET | Freq: Every day | ORAL | 3 refills | Status: DC

## 2017-02-18 MED ORDER — TOPIRAMATE 25 MG PO TABS: 25.0000 mg | ORAL_TABLET | Freq: Two times a day (BID) | ORAL | 5 refills | Status: DC

## 2017-02-18 MED FILL — rOPINIRole HCL 0.25 MG TABS: 0.25 | 90 days supply | Qty: 270 | Fill #0

## 2017-02-18 MED FILL — TOPIRAMATE 25 MG TABLET: 25 | 30 days supply | Qty: 60 | Fill #0

## 2017-02-18 MED FILL — PANTOPRAZOLE SOD DR 40 MG T: 40 | 90 days supply | Qty: 90 | Fill #0

## 2017-02-18 NOTE — Progress Notes (Signed)
Subjective:  Patient ID: Suzanne Schaefer, female    DOB: 12-02-1960  Age: 57 y.o. MRN: [id number]  CC: No chief complaint on file.   HPI Suzanne Schaefer presents for a well exam C/o 22 lbs wt gain - craving/eating sweets  Outpatient Medications Prior to Visit  Medication Sig Dispense Refill  . Aspirin-Caffeine (BAYER BACK & BODY) 500-32.5 MG TABS Take by mouth.    . B Complex-Folic Acid (B COMPLEX-VITAMIN B12 PO) Take 5,000 mcg by mouth once a week.    . Calcium-Vitamin D-Vitamin K (CVS CALCIUM SOFT CHEWS PO) Take 1,200 mg by mouth daily.    . Cetirizine-Pseudoephedrine (ZYRTEC-D PO) Take 1 tablet by mouth every morning.     . Cholecalciferol (VITAMIN D-3) 1000 UNITS CAPS Take 2 capsules by mouth 3 (three) times a week.     . fluticasone (FLONASE) 50 MCG/ACT nasal spray Place 2 sprays into both nostrils at bedtime.    Marland Kitchen guaiFENesin (MUCINEX) 600 MG 12 hr tablet Take 600 mg by mouth 2 (two) times daily.    . Homeopathic Products (CVS LEG CRAMPS PAIN RELIEF PO) Take by mouth daily as needed.    . Ibuprofen (ADVIL) 200 MG CAPS Take by mouth every 6 (six) hours as needed.    . magnesium oxide (MAG-OX) 400 MG tablet Take 400 mg by mouth 3 (three) times a week. Mon,Wed,Fri    . Misc Natural Products (HEALTHY LIVER PO) Take by mouth.    . Misc Natural Products (HM JOINT HEALTH ULTRA PO) Take by mouth.    . Multiple Minerals-Vitamins (CVS CALCIUM 600 PLUS) CHEW Chew 1 each by mouth daily.    . Multiple Vitamins-Minerals (CENTRUM VITAMINTS PO) Take 1 each by mouth 2 (two) times daily.     . pantoprazole (PROTONIX) 40 MG tablet Take 1 tablet (40 mg total) by mouth daily. 90 tablet 3  . polyethylene glycol (MIRALAX / GLYCOLAX) packet Take 17 g by mouth at bedtime.    Marland Kitchen rOPINIRole (REQUIP) 0.25 MG tablet Take 3 tablets (0.75 mg total) by mouth at bedtime. 270 tablet 3  . ALREX 0.2 % SUSP Place 1 drop into the left eye 2 (two) times daily as needed (irritation).   1  . buPROPion (WELLBUTRIN SR) 200  MG 12 hr tablet Take 1 tablet (200 mg total) by mouth daily at 12 noon. 30 tablet 0   Facility-Administered Medications Prior to Visit  Medication Dose Route Frequency Provider Last Rate Last Dose  . 0.9 %  sodium chloride infusion  500 mL Intravenous Continuous Gatha Mayer, MD        ROS Review of Systems  Constitutional: Positive for unexpected weight change. Negative for activity change, appetite change, chills and fatigue.  HENT: Negative for congestion, mouth sores and sinus pressure.   Eyes: Negative for visual disturbance.  Respiratory: Negative for cough and chest tightness.   Gastrointestinal: Negative for abdominal pain and nausea.  Genitourinary: Negative for difficulty urinating, frequency and vaginal pain.  Musculoskeletal: Negative for back pain and gait problem.  Skin: Negative for pallor and rash.  Neurological: Negative for dizziness, tremors, weakness, numbness and headaches.  Psychiatric/Behavioral: Negative for confusion, sleep disturbance and suicidal ideas.    Objective:  BP 112/68 (BP Location: Left Arm, Patient Position: Sitting, Cuff Size: Large)   Pulse (!) 59   Temp 97.9 F (36.6 C) (Oral)   Ht 5\' 2"  (1.575 m)   Wt 193 lb (87.5 kg)   SpO2 100%  BMI 35.30 kg/m   BP Readings from Last 3 Encounters:  02/18/17 112/68  02/13/17 124/86  01/01/17 100/67    Wt Readings from Last 3 Encounters:  02/18/17 193 lb (87.5 kg)  02/13/17 190 lb (86.2 kg)  01/01/17 180 lb (81.6 kg)    Physical Exam  Constitutional: She appears well-developed. No distress.  HENT:  Head: Normocephalic.  Right Ear: External ear normal.  Left Ear: External ear normal.  Nose: Nose normal.  Mouth/Throat: Oropharynx is clear and moist.  Eyes: Conjunctivae are normal. Pupils are equal, round, and reactive to light. Right eye exhibits no discharge. Left eye exhibits no discharge.  Neck: Normal range of motion. Neck supple. No JVD present. No tracheal deviation present. No  thyromegaly present.  Cardiovascular: Normal rate, regular rhythm and normal heart sounds.  Pulmonary/Chest: No stridor. No respiratory distress. She has no wheezes.  Abdominal: Soft. Bowel sounds are normal. She exhibits no distension and no mass. There is no tenderness. There is no rebound and no guarding.  Musculoskeletal: She exhibits no edema or tenderness.  Lymphadenopathy:    She has no cervical adenopathy.  Neurological: She displays normal reflexes. No cranial nerve deficit. She exhibits normal muscle tone. Coordination normal.  Skin: No rash noted. No erythema.  Psychiatric: She has a normal mood and affect. Her behavior is normal. Judgment and thought content normal.  obese  Lab Results  Component Value Date   WBC 5.2 08/15/2016   HGB 13.0 08/15/2016   HCT 38.0 08/15/2016   PLT 200.0 08/15/2016   GLUCOSE 88 12/10/2016   CHOL 218 (H) 12/10/2016   TRIG 62 12/10/2016   HDL 109 12/10/2016   LDLDIRECT 115.2 01/27/2013   LDLCALC 97 12/10/2016   ALT 17 12/10/2016   AST 54 (H) 12/10/2016   NA 142 12/10/2016   K 4.3 12/10/2016   CL 101 12/10/2016   CREATININE 0.65 12/10/2016   BUN 18 12/10/2016   CO2 27 12/10/2016   TSH 1.370 12/10/2016   INR 1.12 02/24/2015   HGBA1C 5.4 12/10/2016    Mr 3d Recon At Scanner  Result Date: 04/13/2015 CLINICAL DATA:  History of pancreatitis.  Elevated LFTs. EXAM: MRI ABDOMEN WITHOUT AND WITH CONTRAST (INCLUDING MRCP) TECHNIQUE: Multiplanar multisequence MR imaging of the abdomen was performed both before and after the administration of intravenous contrast. Heavily T2-weighted images of the biliary and pancreatic ducts were obtained, and three-dimensional MRCP images were rendered by post processing. CONTRAST:  87mL MULTIHANCE GADOBENATE DIMEGLUMINE 529 MG/ML IV SOLN COMPARISON:  None. FINDINGS: Lower chest: No pleural effusion identified. The lung bases are clear. Hepatobiliary: No focal liver abnormality. Previous cholecystectomy. The common  bile duct measures up to 6 mm. No obstructing stone or mass noted. Pancreas: The pancreas is normal. No inflammation or mass identified. Spleen: Normal in size. Adrenals/Urinary Tract: The adrenal glands are unremarkable. Normal appearance of the kidneys. Stomach/Bowel: The stomach is normal. The visualized upper abdominal bowel loops are unremarkable. No dilated loops of bowel identified. Vascular/Lymphatic: Normal appearance of the abdominal aorta. No upper abdominal adenopathy identified. Other: Negative Musculoskeletal: No abnormal signal identified from within the bone marrow. IMPRESSION: 1. No acute findings identified within the upper abdomen. 2. Previous cholecystectomy. No significant biliary dilatation. No choledocholithiasis Electronically Signed   By: Kerby Moors M.D.   On: 04/13/2015 10:09   Mr Jeananne Rama W/wo Cm/mrcp  Result Date: 04/13/2015 CLINICAL DATA:  History of pancreatitis.  Elevated LFTs. EXAM: MRI ABDOMEN WITHOUT AND WITH CONTRAST (INCLUDING MRCP) TECHNIQUE: Multiplanar  multisequence MR imaging of the abdomen was performed both before and after the administration of intravenous contrast. Heavily T2-weighted images of the biliary and pancreatic ducts were obtained, and three-dimensional MRCP images were rendered by post processing. CONTRAST:  8mL MULTIHANCE GADOBENATE DIMEGLUMINE 529 MG/ML IV SOLN COMPARISON:  None. FINDINGS: Lower chest: No pleural effusion identified. The lung bases are clear. Hepatobiliary: No focal liver abnormality. Previous cholecystectomy. The common bile duct measures up to 6 mm. No obstructing stone or mass noted. Pancreas: The pancreas is normal. No inflammation or mass identified. Spleen: Normal in size. Adrenals/Urinary Tract: The adrenal glands are unremarkable. Normal appearance of the kidneys. Stomach/Bowel: The stomach is normal. The visualized upper abdominal bowel loops are unremarkable. No dilated loops of bowel identified. Vascular/Lymphatic: Normal  appearance of the abdominal aorta. No upper abdominal adenopathy identified. Other: Negative Musculoskeletal: No abnormal signal identified from within the bone marrow. IMPRESSION: 1. No acute findings identified within the upper abdomen. 2. Previous cholecystectomy. No significant biliary dilatation. No choledocholithiasis Electronically Signed   By: Kerby Moors M.D.   On: 04/13/2015 10:09    Assessment & Plan:   There are no diagnoses linked to this encounter. I have discontinued Dyan Labarbera. Cadieux's ALREX and buPROPion. I am also having her maintain her Cetirizine-Pseudoephedrine (ZYRTEC-D PO), Vitamin D-3, fluticasone, Multiple Vitamins-Minerals (CENTRUM VITAMINTS PO), magnesium oxide, Calcium-Vitamin D-Vitamin K (CVS CALCIUM SOFT CHEWS PO), polyethylene glycol, guaiFENesin, B Complex-Folic Acid (B COMPLEX-VITAMIN B12 PO), CVS CALCIUM 600 PLUS, pantoprazole, rOPINIRole, Misc Natural Products (HEALTHY LIVER PO), Misc Natural Products (HM JOINT HEALTH ULTRA PO), Aspirin-Caffeine, Ibuprofen, and Homeopathic Products (CVS LEG CRAMPS PAIN RELIEF PO). We will continue to administer sodium chloride.  No orders of the defined types were placed in this encounter.    Follow-up: No Follow-up on file.  Cichowski Kehr, MD

## 2017-02-18 NOTE — Assessment & Plan Note (Signed)
Vit D 

## 2017-02-18 NOTE — Assessment & Plan Note (Signed)
Labs

## 2017-02-18 NOTE — Assessment & Plan Note (Signed)
Wt loss discussed We discussed age appropriate health related issues, including available/recomended screening tests and vaccinations. We discussed a need for adhering to healthy diet and exercise. Labs were ordered to be later reviewed . All questions were answered.

## 2017-02-18 NOTE — Assessment & Plan Note (Signed)
Dr.Beasley 

## 2017-02-18 NOTE — Patient Instructions (Signed)
Topamax Contrave Saxenda

## 2017-02-25 ENCOUNTER — Other Ambulatory Visit (INDEPENDENT_AMBULATORY_CARE_PROVIDER_SITE_OTHER): Payer: 59

## 2017-02-25 ENCOUNTER — Telehealth: Payer: Self-pay | Admitting: Internal Medicine

## 2017-02-25 DIAGNOSIS — E669 Obesity, unspecified: Secondary | ICD-10-CM

## 2017-02-25 DIAGNOSIS — Z6832 Body mass index (BMI) 32.0-32.9, adult: Secondary | ICD-10-CM | POA: Diagnosis not present

## 2017-02-25 DIAGNOSIS — R740 Nonspecific elevation of levels of transaminase and lactic acid dehydrogenase [LDH]: Secondary | ICD-10-CM | POA: Diagnosis not present

## 2017-02-25 DIAGNOSIS — E559 Vitamin D deficiency, unspecified: Secondary | ICD-10-CM | POA: Diagnosis not present

## 2017-02-25 DIAGNOSIS — D509 Iron deficiency anemia, unspecified: Secondary | ICD-10-CM | POA: Diagnosis not present

## 2017-02-25 DIAGNOSIS — R7303 Prediabetes: Secondary | ICD-10-CM

## 2017-02-25 DIAGNOSIS — Z0000 Encounter for general adult medical examination without abnormal findings: Secondary | ICD-10-CM | POA: Diagnosis not present

## 2017-02-25 DIAGNOSIS — R7401 Elevation of levels of liver transaminase levels: Secondary | ICD-10-CM

## 2017-02-25 LAB — LIPID PANEL
Cholesterol: 216 mg/dL — ABNORMAL HIGH (ref 0–200)
HDL: 108.9 mg/dL (ref >39.00)
LDL CALC: 93 mg/dL (ref 0–99)
NONHDL: 106.69
Total CHOL/HDL Ratio: 2
Triglycerides: 69 mg/dL (ref 0.0–149.0)
VLDL: 13.8 mg/dL (ref 0.0–40.0)

## 2017-02-25 LAB — BASIC METABOLIC PANEL
BUN: 22 mg/dL (ref 6–23)
CO2: 28 mEq/L (ref 19–32)
Calcium: 9.4 mg/dL (ref 8.4–10.5)
Chloride: 102 mEq/L (ref 96–112)
Creatinine, Ser: 0.66 mg/dL (ref 0.40–1.20)
GFR: 98.51 mL/min (ref >60.00)
GLUCOSE: 104 mg/dL — AB (ref 70–99)
POTASSIUM: 4 meq/L (ref 3.5–5.1)
SODIUM: 139 meq/L (ref 135–145)

## 2017-02-25 LAB — HEPATIC FUNCTION PANEL
ALBUMIN: 4.2 g/dL (ref 3.5–5.2)
ALT: 18 U/L (ref 0–35)
AST: 50 U/L — ABNORMAL HIGH (ref 0–37)
Alkaline Phosphatase: 123 U/L — ABNORMAL HIGH (ref 39–117)
BILIRUBIN TOTAL: 0.4 mg/dL (ref 0.2–1.2)
Bilirubin, Direct: 0.1 mg/dL (ref 0.0–0.3)
Total Protein: 6.9 g/dL (ref 6.0–8.3)

## 2017-02-25 LAB — URINALYSIS
Bilirubin Urine: NEGATIVE
Hgb urine dipstick: NEGATIVE
KETONES UR: NEGATIVE
Leukocytes, UA: NEGATIVE
Nitrite: NEGATIVE
PH: 6 (ref 5.0–8.0)
TOTAL PROTEIN, URINE-UPE24: NEGATIVE
URINE GLUCOSE: NEGATIVE
UROBILINOGEN UA: 0.2 (ref 0.0–1.0)

## 2017-02-25 LAB — CBC WITH DIFFERENTIAL/PLATELET
BASOS ABS: 0 10*3/uL (ref 0.0–0.1)
Basophils Relative: 0.8 % (ref 0.0–3.0)
EOS PCT: 2.3 % (ref 0.0–5.0)
Eosinophils Absolute: 0.1 10*3/uL (ref 0.0–0.7)
HEMATOCRIT: 39.7 % (ref 36.0–46.0)
HEMOGLOBIN: 13.3 g/dL (ref 12.0–15.0)
LYMPHS ABS: 2.2 10*3/uL (ref 0.7–4.0)
LYMPHS PCT: 38.1 % (ref 12.0–46.0)
MCHC: 33.5 g/dL (ref 30.0–36.0)
MCV: 86.7 fl (ref 78.0–100.0)
MONOS PCT: 7.9 % (ref 3.0–12.0)
Monocytes Absolute: 0.5 10*3/uL (ref 0.1–1.0)
NEUTROS PCT: 50.9 % (ref 43.0–77.0)
Neutro Abs: 3 10*3/uL (ref 1.4–7.7)
Platelets: 261 10*3/uL (ref 150.0–400.0)
RBC: 4.58 Mil/uL (ref 3.87–5.11)
RDW: 14.1 % (ref 11.5–15.5)
WBC: 5.8 10*3/uL (ref 4.0–10.5)

## 2017-02-25 LAB — VITAMIN D 25 HYDROXY (VIT D DEFICIENCY, FRACTURES): VITD: 73.07 ng/mL (ref 30.00–100.00)

## 2017-02-25 LAB — TSH: TSH: 1.78 u[IU]/mL (ref 0.35–4.50)

## 2017-02-25 LAB — VITAMIN B12: Vitamin B-12: 776 pg/mL (ref 211–911)

## 2017-02-25 NOTE — Telephone Encounter (Signed)
Pt would like an order for a Bone Density order, please advise, last one was 2015

## 2017-02-26 NOTE — Telephone Encounter (Signed)
LM notifying pt that GYN office already ordered this and she just has to call back to schedule.

## 2017-03-18 ENCOUNTER — Ambulatory Visit (INDEPENDENT_AMBULATORY_CARE_PROVIDER_SITE_OTHER)
Admission: RE | Admit: 2017-03-18 | Discharge: 2017-03-18 | Disposition: A | Discharge status: Home / Self Care | Payer: 59 | Source: Ambulatory Visit | Attending: Obstetrics & Gynecology | Admitting: Obstetrics & Gynecology

## 2017-03-18 DIAGNOSIS — Z13820 Encounter for screening for osteoporosis: Secondary | ICD-10-CM

## 2017-04-15 ENCOUNTER — Encounter: Payer: Self-pay | Admitting: Internal Medicine

## 2017-04-15 ENCOUNTER — Encounter: Payer: 59 | Attending: Surgery | Admitting: Registered"

## 2017-04-15 DIAGNOSIS — Z6834 Body mass index (BMI) 34.0-34.9, adult: Secondary | ICD-10-CM | POA: Diagnosis not present

## 2017-04-15 DIAGNOSIS — Z713 Dietary counseling and surveillance: Secondary | ICD-10-CM | POA: Insufficient documentation

## 2017-04-15 DIAGNOSIS — E669 Obesity, unspecified: Secondary | ICD-10-CM | POA: Diagnosis not present

## 2017-04-15 DIAGNOSIS — Z9884 Bariatric surgery status: Secondary | ICD-10-CM | POA: Diagnosis not present

## 2017-04-15 NOTE — Patient Instructions (Addendum)
-   Find a mental health provider that you connect.   - Take 3 calcium supplements per day at least 2 hrs away from each other.

## 2017-04-15 NOTE — Progress Notes (Signed)
Follow-up visit: 3 Years Post-Operative Sleeve Gastrectomy Surgery  Medical Nutrition Therapy:  Appt start time: 9:15 end time: 10:00  Primary concerns today: Post-operative Bariatric Surgery Nutrition Management.   Pre-surgery Goals: to get healthier and live longer  Expectations: wants to know what she needs to do to keep losing weight. Pt states she is taking Terrance Mass from [place]. Pt states she started taking less than a week ago, has not noticed a difference yet. Pt reports goal weight of 140-150 lbs.   Pt states she is healthier, didn't have blood pressure, cholesterol, or an issue with blood sugar numbers. Pt states she was going to gym and working out on "off days" for 2 weeks. Pt states she quit going to [facility] due to them not helping her. Pt states she was offered depression medication to help with suppressing appetite. Pt states she does not take topamax due to fear she will have liver side effects, currently has fatty liver. Pt states she is unhappy with her body image and increasing weight. Pt states she has fat thighs. Pt states she eats a good amount of vegetables with her salads: salad mix with cauliflower, broccoli, pickles, and deli meat. Pt states she prefers chewable vitamins; taking Centrum chewables.   Pt arrives today having gained 10.2 lbs from previous 6 months ago. According to pt's record, her lowest weight post-op was 168.8 lbs on 07/11/2015, 15 months post-op.   Pt has had the sleeve gastrectomy 2 years ago today. Pt states she needs a readjustment to lose more weight. Pt seems agitated at all questions asked and asked if Kathlee Nations and Magda Paganini were coming back. Pt states she sees her surgeon march 29th. Pt states she has a lot of cramps in her feet and legs at night, worse during the week when she is working out. Pt also states she can hear her heart beat at night when everything is quiet stating it is not fast or slow and there is no pain. Pt states after a walk she  felt lightheaded and dizzy before her walk she had bacon and eggs.   Non scale victories: none stated  Surgery date: 04/05/2014 Surgery type: Sleeve gastrectomy Start weight at [facility]: 269 lbs on 01/08/2014 Weight today: 189.4 lbs Weight change: 5.2 lbs gain from 10/15/2016 Total weight lost: 79.6 lbs loss Weight loss goal: 140-150 lbs    TANITA  BODY COMP RESULTS  04/04/2016 10/15/2016 04/15/2017   BMI (kg/m^2) 30.8 32.6 34.6   Fat Mass (lbs) 63.6 72.4 79.4   Fat Free Mass (lbs) 110.4 111.8 110.0   Total Body Water (lbs) 77.8 79.4 78.4     Surgery date: 04/05/2014 Surgery type: Gastric sleeve Start weight at [facility]: 269 lbs on 01/08/2014 Weight today: 168.8 lbs Weight change: 1.2 lbs  Total weight loss: 100.2 lbs  TANITA BODY COMP RESULTS  03/14/14 04/19/14 05/31/14 06/27/14 09/27/14 12/12/14 01/03/15 04/05/15 07/11/15  BMI (kg/m^2) 47.7 43.0 40.7 38.8 34.9 33.0 32.7 30.1 29.9  Fat Mass (lbs) 134.5 123.5 98.5 99.5 79.0 63.0 71.0 57.5 56.8  Fat Free Mass (lbs) 135 119.0 131.5 119.5 118.0 123.5 113.5 112.5 112  Total Body Water (lbs) 99 87.0 96.5 87.5 86.5 90.5 83.0 82.5 78.8                  Preferred Learning Style:   No preference indicated   Learning Readiness:   Ready  24-hr recall: B (6 AM): Iso100 protein powder (25g) with Fairlife milk (13g) or 2  oreos, protein shake Snk (AM): egg (6g) and bacon (3g)  L (PM): chicken salad, chips  Snk (PM): none D (PM): greek yogurt (12-15g), peanuts (7g) or grilled chicken (21g) Snk (PM): yogurt bar (6g)  Fluid intake: water, 64+ oz Estimated total protein intake: 60-70 g  Medications:  See list  Supplementation: Centrum Complete 2x/day, 2 Ca once a day; B complex liquid once/week, Vitamin D  Using straws: occasionally when driving Drinking while eating: no Hair loss: no Carbonated beverages: no N/V/D/C/GAS:no, no, no, yes: taking miralax daily or every other  day to keep her regular, no Dumping syndrome: No Having you been chewing well: yes Chewing/swallowing difficulties:  no Changes in vision: no Changes to mood/headaches:  no Hair loss/Changes to skin/Changes to nails: no Any difficulty focusing or concentrating: no Sweating:  no Dizziness/Lightheaded: when working out sometimes Palpitations: no Abdominal Pain: no  Recent physical activity:  [facility] program MWF 30 min cardio 50 min strength training;  yardwork and other activities occasionally (walking and kayaking)  Progress Towards Goal(s):  In progress.   Nutritional Diagnosis:  [place]-3.3 Overweight/obesity related to past poor dietary habits and physical inactivity as evidenced by patient w/ recent sleeve gastrectomy surgery following dietary guidelines for continued weight loss.    Intervention:  Nutrition education/diet reinforcement Goals: - Find a mental health provider that you connect.  - Take 3 calcium supplements per day at least 2 hrs away from each other.  Teaching Method Utilized:  Visual Auditory Hands on  Barriers to learning/adherence to lifestyle change: none  Demonstrated degree of understanding via:  Teach Back   Monitoring/Evaluation:  Dietary intake, exercise, and body weight.

## 2017-04-27 ENCOUNTER — Other Ambulatory Visit: Payer: Self-pay | Admitting: Internal Medicine

## 2017-04-27 MED ORDER — TOPIRAMATE 25 MG PO TABS: 25.0000 mg | ORAL_TABLET | Freq: Two times a day (BID) | ORAL | 5 refills | Status: DC

## 2017-05-08 DIAGNOSIS — Z9884 Bariatric surgery status: Secondary | ICD-10-CM | POA: Diagnosis not present

## 2017-05-20 ENCOUNTER — Ambulatory Visit: Payer: 59 | Admitting: Internal Medicine

## 2017-05-29 MED FILL — PANTOPRAZOLE SOD DR 40 MG T: 40 | 90 days supply | Qty: 90 | Fill #1

## 2017-05-29 MED FILL — rOPINIRole HCL 0.25 MG TABS: 0.25 | 90 days supply | Qty: 270 | Fill #1

## 2017-07-15 ENCOUNTER — Encounter: Payer: Self-pay | Admitting: Registered"

## 2017-07-15 ENCOUNTER — Encounter: Payer: 59 | Attending: Surgery | Admitting: Registered"

## 2017-07-15 DIAGNOSIS — Z9884 Bariatric surgery status: Secondary | ICD-10-CM | POA: Insufficient documentation

## 2017-07-15 DIAGNOSIS — Z713 Dietary counseling and surveillance: Secondary | ICD-10-CM | POA: Insufficient documentation

## 2017-07-15 DIAGNOSIS — E669 Obesity, unspecified: Secondary | ICD-10-CM | POA: Insufficient documentation

## 2017-07-15 DIAGNOSIS — Z6834 Body mass index (BMI) 34.0-34.9, adult: Secondary | ICD-10-CM | POA: Diagnosis not present

## 2017-07-15 NOTE — Patient Instructions (Addendum)
-   Aim to exercise at least 5 days a week, having non-consecutive rest days.   - Look into doing some pool workouts.   - Try not to focus on the numbers.   - Find a mental health provider that you connect.

## 2017-07-15 NOTE — Progress Notes (Signed)
Follow-up visit: 3 Years Post-Operative Sleeve Gastrectomy Surgery  Medical Nutrition Therapy:  Appt start time: 9:15 end time: 9:45  Primary concerns today: Post-operative Bariatric Surgery Nutrition Management.   Pre-surgery Goals: to get healthier and live longer  Expectations: wants to know what she needs to do to keep losing weight. Pt states she is taking Terrance Mass from [place]. Pt states she started taking less than a week ago, has not noticed a difference yet. Pt reports goal weight of 140-150 lbs.   Pt states she has been focusing more on keeping track of how many fluids she is drinking. Pt is weight-focused and wants to lose weight in thighs and hips. Pt states she does not like the way she looks in the mirror with hanging skin.   Pt states she currently has fatty liver. Pt states she is unhappy with her body image and increasing weight. Pt states she has fat thighs. Pt states she eats a good amount of vegetables with her salads: salad mix with cauliflower, broccoli, pickles, and deli meat. Pt states she prefers chewable vitamins; taking Centrum chewables.   According to pt's record, her lowest weight post-op was 168.8 lbs on 07/11/2015, 15 months post-op.   Pt has had the sleeve gastrectomy 3 years ago today. Pt states she needs a readjustment to lose more weight. Pt seems agitated at all questions asked and asked if Kathlee Nations and Magda Paganini were coming back. Pt states she has a lot of cramps in her feet and legs at night, worse during the week when she is working out. Pt also states she can hear her heart beat at night when everything is quiet stating it is not fast or slow and there is no pain.   Non scale victories: none stated  Surgery date: 04/05/2014 Surgery type: Sleeve gastrectomy Start weight at [facility]: 269 lbs on 01/08/2014 Weight today: 191.8 lbs Weight change: 2.4 lbs gain from 189.4 (04/15/2017) Total weight lost: 77.2 lbs loss Weight loss goal: 140-150 lbs    TANITA  BODY  COMP RESULTS  04/04/2016 10/15/2016 04/15/2017 07/15/2017   BMI (kg/m^2) 30.8 32.6 34.6 35.1   Fat Mass (lbs) 63.6 72.4 79.4 78.2   Fat Free Mass (lbs) 110.4 111.8 110.0 113.6   Total Body Water (lbs) 77.8 79.4 78.4 80.4     Surgery date: 04/05/2014 Surgery type: Gastric sleeve Start weight at [facility]: 269 lbs on 01/08/2014 Weight today: 168.8 lbs Weight change: 1.2 lbs  Total weight loss: 100.2 lbs  TANITA BODY COMP RESULTS  03/14/14 04/19/14 05/31/14 06/27/14 09/27/14 12/12/14 01/03/15 04/05/15 07/11/15  BMI (kg/m^2) 47.7 43.0 40.7 38.8 34.9 33.0 32.7 30.1 29.9  Fat Mass (lbs) 134.5 123.5 98.5 99.5 79.0 63.0 71.0 57.5 56.8  Fat Free Mass (lbs) 135 119.0 131.5 119.5 118.0 123.5 113.5 112.5 112  Total Body Water (lbs) 99 87.0 96.5 87.5 86.5 90.5 83.0 82.5 78.8                  Preferred Learning Style:   No preference indicated   Learning Readiness:   Ready  24-hr recall: B (6 AM): Iso100 protein powder (25g) with Fairlife milk (13g) or 2 oreos, protein shake Snk (AM): egg (6g) and bacon (3g)  L (PM): chicken + salad Snk (PM): none D (PM): greek yogurt (12-15g), peanuts (7g) or grilled chicken (21g) Snk (PM): yogurt bar (6g)  Fluid intake: water, 64+ oz Estimated total protein intake: 60-70 g  Medications:  See list  Supplementation: Centrum Complete 2x/day, 2  Ca once a day; B complex liquid once/week, Vitamin D  Using straws: occasionally when driving Drinking while eating: no Hair loss: no Carbonated beverages: no N/V/D/C/GAS:no, no, no, yes: taking miralax daily or every other day to keep her regular, no Dumping syndrome: No Having you been chewing well: yes Chewing/swallowing difficulties:  no Changes in vision: no Changes to mood/headaches:  no Hair loss/Changes to skin/Changes to nails: no Any difficulty focusing or concentrating: no Sweating:  no Dizziness/Lightheaded: when working out  sometimes Palpitations: no Abdominal Pain: no  Recent physical activity:  [facility] program MWF 30 min cardio 50 min strength training;  yardwork and other activities occasionally (walking and kayaking)  Progress Towards Goal(s):  In progress.   Nutritional Diagnosis:  [place]-3.3 Overweight/obesity related to past poor dietary habits and physical inactivity as evidenced by patient w/ recent sleeve gastrectomy surgery following dietary guidelines for continued weight loss.    Intervention:  Nutrition education/diet reinforcement Goals: - Aim to exercise at least 5 days a week, having non-consecutive rest days.  - Look into doing some pool workouts.  - Try not to focus on the numbers.  - Find a mental health provider that you connect.   Teaching Method Utilized:  Visual Auditory Hands on  Barriers to learning/adherence to lifestyle change: none  Demonstrated degree of understanding via:  Teach Back   Monitoring/Evaluation:  Dietary intake, exercise, and body weight.

## 2017-08-27 MED FILL — PANTOPRAZOLE SOD DR 40 MG T: 40 | 90 days supply | Qty: 90 | Fill #2

## 2017-08-27 MED FILL — rOPINIRole HCL 0.25 MG TABS: 0.25 | 90 days supply | Qty: 270 | Fill #2

## 2017-11-27 MED FILL — rOPINIRole HCL 0.25 MG TABS: 0.25 | 90 days supply | Qty: 270 | Fill #3

## 2017-11-27 MED FILL — PANTOPRAZOLE SOD DR 40 MG T: 40 | 90 days supply | Qty: 90 | Fill #3

## 2017-12-02 DIAGNOSIS — Z1231 Encounter for screening mammogram for malignant neoplasm of breast: Secondary | ICD-10-CM | POA: Diagnosis not present

## 2017-12-02 LAB — HM MAMMOGRAPHY

## 2017-12-05 ENCOUNTER — Encounter: Payer: Self-pay | Admitting: Internal Medicine

## 2018-02-03 DIAGNOSIS — H524 Presbyopia: Secondary | ICD-10-CM | POA: Diagnosis not present

## 2018-02-19 ENCOUNTER — Encounter: Payer: Self-pay | Admitting: Obstetrics & Gynecology

## 2018-02-19 ENCOUNTER — Ambulatory Visit (INDEPENDENT_AMBULATORY_CARE_PROVIDER_SITE_OTHER): Payer: 59 | Admitting: Obstetrics & Gynecology

## 2018-02-19 VITALS — BP 126/82 | Ht 62.0 in | Wt 205.0 lb

## 2018-02-19 DIAGNOSIS — E6609 Other obesity due to excess calories: Secondary | ICD-10-CM | POA: Diagnosis not present

## 2018-02-19 DIAGNOSIS — Z1151 Encounter for screening for human papillomavirus (HPV): Secondary | ICD-10-CM

## 2018-02-19 DIAGNOSIS — Z780 Asymptomatic menopausal state: Secondary | ICD-10-CM | POA: Diagnosis not present

## 2018-02-19 DIAGNOSIS — Z6837 Body mass index (BMI) 37.0-37.9, adult: Secondary | ICD-10-CM

## 2018-02-19 DIAGNOSIS — Z01419 Encounter for gynecological examination (general) (routine) without abnormal findings: Secondary | ICD-10-CM

## 2018-02-19 NOTE — Progress Notes (Signed)
Suzanne Schaefer 19-Dec-1960 [id number]   History:    58 y.o. G2P2L2 Divorced.  2 children in their 64s.  RP:  Established patient presenting for annual gyn exam   HPI: Abstinent x 2013.  H/O Vulvar warts back then, no recurrence in the past several years.  Menopause, well on no hormone replacement therapy.  No postmenopausal bleeding.  No pelvic pain.  Urine and bowel movements normal.  Breast normal.  Body mass index 37.49.  Health labs with family physician.  Past medical history,surgical history, family history and social history were all reviewed and documented in the EPIC chart.  Gynecologic History Contraception: abstinence and post menopausal status Last Pap: 02/2017. Results were: Negative/HPV HR neg Last mammogram: 11/2017. Results were: Benign Bone Density: Never Colonoscopy: 2019  Obstetric History OB History  Gravida Para Term Preterm AB Living  2 2       2   SAB TAB Ectopic Multiple Live Births               # Outcome Date GA Lbr Len/2nd Weight Sex Delivery Anes PTL Lv  2 Para           1 Para              ROS: A ROS was performed and pertinent positives and negatives are included in the history.  GENERAL: No fevers or chills. HEENT: No change in vision, no earache, sore throat or sinus congestion. NECK: No pain or stiffness. CARDIOVASCULAR: No chest pain or pressure. No palpitations. PULMONARY: No shortness of breath, cough or wheeze. GASTROINTESTINAL: No abdominal pain, nausea, vomiting or diarrhea, melena or bright red blood per rectum. GENITOURINARY: No urinary frequency, urgency, hesitancy or dysuria. MUSCULOSKELETAL: No joint or muscle pain, no back pain, no recent trauma. DERMATOLOGIC: No rash, no itching, no lesions. ENDOCRINE: No polyuria, polydipsia, no heat or cold intolerance. No recent change in weight. HEMATOLOGICAL: No anemia or easy bruising or bleeding. NEUROLOGIC: No headache, seizures, numbness, tingling or weakness. PSYCHIATRIC: No depression, no loss  of interest in normal activity or change in sleep pattern.     Exam:   Ht 5\' 2"  (1.575 m)   Wt 205 lb (93 kg)   BMI 37.49 kg/m  BP 126/82 Body mass index is 37.49 kg/m.  General appearance : Well developed well nourished female. No acute distress HEENT: Eyes: no retinal hemorrhage or exudates,  Neck supple, trachea midline, no carotid bruits, no thyroidmegaly Lungs: Clear to auscultation, no rhonchi or wheezes, or rib retractions  Heart: Regular rate and rhythm, no murmurs or gallops Breast:Examined in sitting and supine position were symmetrical in appearance, no palpable masses or tenderness,  no skin retraction, no nipple inversion, no nipple discharge, no skin discoloration, no axillary or supraclavicular lymphadenopathy Abdomen: no palpable masses or tenderness, no rebound or guarding Extremities: no edema or skin discoloration or tenderness  Pelvic: Vulva: Normal             Vagina: No gross lesions or discharge  Cervix: No gross lesions or discharge.  Pap/HPV HR done  Uterus  AV, normal size, shape and consistency, non-tender and mobile  Adnexa  Without masses or tenderness  Anus: Normal   Assessment/Plan:  58 y.o. female for annual exam   1. Encounter for gynecological examination with Papanicolaou smear of cervix Normal gynecologic exam in menopause.  Pap with high-risk HPV done today.  Breast exam normal.  Screening mammogram October 2019 was benign.  Colonoscopy 2019.  Health labs  with family physician. - PAP,TP IMGw/HPV RNA,rflx [id number],55/73  2. Special screening examination for human papillomavirus (HPV) - PAP,TP IMGw/HPV RNA,rflx [id number],62/37  3. Postmenopausal Well on no hormone replacement therapy.  No postmenopausal bleeding.  Vitamin D supplements, calcium intake of 1.5 g/day and regular weightbearing physical activities recommended.  4. Class 2 obesity due to excess calories without serious comorbidity with body mass index (BMI) of 37.0 to 37.9 in  adult Recommend lower calorie/carb diet such as [employer].  Aerobic physical activities 5 times a week and weightlifting every 2 days.  Princess Bruins MD, 8:35 AM 02/19/2018

## 2018-02-22 ENCOUNTER — Encounter: Payer: Self-pay | Admitting: Obstetrics & Gynecology

## 2018-02-22 NOTE — Patient Instructions (Signed)
1. Encounter for gynecological examination with Papanicolaou smear of cervix Normal gynecologic exam in menopause.  Pap with high-risk HPV done today.  Breast exam normal.  Screening mammogram October 2019 was benign.  Colonoscopy 2019.  Health labs with family physician. - PAP,TP IMGw/HPV RNA,rflx [id number],64/33  2. Special screening examination for human papillomavirus (HPV) - PAP,TP IMGw/HPV RNA,rflx [id number],60/63  3. Postmenopausal Well on no hormone replacement therapy.  No postmenopausal bleeding.  Vitamin D supplements, calcium intake of 1.5 g/day and regular weightbearing physical activities recommended.  4. Class 2 obesity due to excess calories without serious comorbidity with body mass index (BMI) of 37.0 to 37.9 in adult Recommend lower calorie/carb diet such as [employer].  Aerobic physical activities 5 times a week and weightlifting every 2 days.  Suzanne Schaefer, it was a pleasure seeing you today!  I will inform you of your results as soon as they are available.

## 2018-02-23 LAB — PAP, TP IMAGING W/ HPV RNA, RFLX HPV TYPE 16,18/45: HPV DNA High Risk: NOT DETECTED

## 2018-02-24 ENCOUNTER — Encounter: Payer: Self-pay | Admitting: Internal Medicine

## 2018-02-24 ENCOUNTER — Ambulatory Visit (INDEPENDENT_AMBULATORY_CARE_PROVIDER_SITE_OTHER): Payer: 59 | Admitting: Internal Medicine

## 2018-02-24 VITALS — BP 116/78 | HR 58 | Temp 97.7°F | Ht 62.0 in | Wt 205.0 lb

## 2018-02-24 DIAGNOSIS — K439 Ventral hernia without obstruction or gangrene: Secondary | ICD-10-CM | POA: Insufficient documentation

## 2018-02-24 DIAGNOSIS — E538 Deficiency of other specified B group vitamins: Secondary | ICD-10-CM

## 2018-02-24 DIAGNOSIS — Z23 Encounter for immunization: Secondary | ICD-10-CM | POA: Diagnosis not present

## 2018-02-24 DIAGNOSIS — R7303 Prediabetes: Secondary | ICD-10-CM | POA: Diagnosis not present

## 2018-02-24 DIAGNOSIS — M79604 Pain in right leg: Secondary | ICD-10-CM | POA: Diagnosis not present

## 2018-02-24 DIAGNOSIS — Z0000 Encounter for general adult medical examination without abnormal findings: Secondary | ICD-10-CM

## 2018-02-24 NOTE — Assessment & Plan Note (Signed)
Will watch  small hernia palpable to the R from umbilicus

## 2018-02-24 NOTE — Progress Notes (Signed)
Subjective:  Patient ID: Suzanne Schaefer, female    DOB: Sep 04, 1960  Age: 58 y.o. MRN: [id number]  CC: No chief complaint on file.   HPI Suzanne Schaefer presents for a well exam C/o wt gain 15 lbs C/o R hamstring catch - pain x 3 mo worse w/walking ?hernia near [place] - burning  Outpatient Medications Prior to Visit  Medication Sig Dispense Refill  . Aspirin-Caffeine (BAYER BACK & BODY) 500-32.5 MG TABS Take by mouth.    . B Complex-Folic Acid (B COMPLEX-VITAMIN B12 PO) Take 5,000 mcg by mouth once a week.    . Calcium-Vitamin D-Vitamin K ([employer]S CALCIUM SOFT CHEWS PO) Take 1,200 mg by mouth daily.    . Cetirizine-Pseudoephedrine (ZYRTEC-D PO) Take 1 tablet by mouth every morning.     . Cholecalciferol (VITAMIN D-3) 1000 UNITS CAPS Take 2 capsules by mouth 3 (three) times a week.     . fluticasone (FLONASE) 50 MCG/ACT nasal spray Place 2 sprays into both nostrils at bedtime.    Marland Kitchen guaiFENesin (MUCINEX) 600 MG 12 hr tablet Take 600 mg by mouth 2 (two) times daily.    . Homeopathic Products (CVS LEG CRAMPS PAIN RELIEF PO) Take by mouth daily as needed.    . Ibuprofen (ADVIL) 200 MG CAPS Take by mouth every 6 (six) hours as needed.    . magnesium oxide (MAG-OX) 400 MG tablet Take 400 mg by mouth 3 (three) times a week. Mon,Wed,Fri    . Misc Natural Products (HEALTHY LIVER PO) Take by mouth.    . Misc Natural Products (HM JOINT HEALTH ULTRA PO) Take by mouth.    . Multiple Minerals-Vitamins (CVS CALCIUM 600 PLUS) CHEW Chew 1 each by mouth daily.    . Multiple Vitamins-Minerals (CENTRUM VITAMINTS PO) Take 1 each by mouth 2 (two) times daily.     . pantoprazole (PROTONIX) 40 MG tablet Take 1 tablet (40 mg total) by mouth daily. 90 tablet 3  . polyethylene glycol (MIRALAX / GLYCOLAX) packet Take 17 g by mouth at bedtime.    Marland Kitchen rOPINIRole (REQUIP) 0.25 MG tablet Take 3 tablets (0.75 mg total) by mouth at bedtime. 270 tablet 3   Facility-Administered Medications Prior to Visit  Medication Dose Route  Frequency Provider Last Rate Last Dose  . 0.9 %  sodium chloride infusion  500 mL Intravenous Continuous Gatha Mayer, MD        ROS: Review of Systems  Constitutional: Positive for unexpected weight change. Negative for activity change, appetite change, chills and fatigue.  HENT: Negative for congestion, mouth sores and sinus pressure.   Eyes: Negative for visual disturbance.  Respiratory: Negative for cough and chest tightness.   Gastrointestinal: Negative for abdominal pain and nausea.  Genitourinary: Negative for difficulty urinating, frequency and vaginal pain.  Musculoskeletal: Negative for back pain and gait problem.  Skin: Negative for pallor and rash.  Neurological: Negative for dizziness, tremors, weakness, numbness and headaches.  Psychiatric/Behavioral: Negative for confusion, sleep disturbance and suicidal ideas.    Objective:  BP 116/78 (BP Location: Left Arm, Patient Position: Sitting, Cuff Size: Large)   Pulse (!) 58   Temp 97.7 F (36.5 C) (Oral)   Ht 5\' 2"  (1.575 m)   Wt 205 lb (93 kg)   SpO2 98%   BMI 37.49 kg/m   BP Readings from Last 3 Encounters:  02/24/18 116/78  02/19/18 126/82  02/18/17 112/68    Wt Readings from Last 3 Encounters:  02/24/18 205 lb (93 kg)  02/19/18 205 lb (93 kg)  07/15/17 191 lb 12.8 oz (87 kg)    Physical Exam Constitutional:      General: She is not in acute distress.    Appearance: She is well-developed.  HENT:     Head: Normocephalic.     Right Ear: External ear normal.     Left Ear: External ear normal.     Nose: Nose normal.  Eyes:     General:        Right eye: No discharge.        Left eye: No discharge.     Conjunctiva/sclera: Conjunctivae normal.     Pupils: Pupils are equal, round, and reactive to light.  Neck:     Musculoskeletal: Normal range of motion and neck supple.     Thyroid: No thyromegaly.     Vascular: No JVD.     Trachea: No tracheal deviation.  Cardiovascular:     Rate and Rhythm:  Normal rate and regular rhythm.     Heart sounds: Normal heart sounds.  Pulmonary:     Effort: No respiratory distress.     Breath sounds: No stridor. No wheezing.  Abdominal:     General: Bowel sounds are normal. There is no distension.     Palpations: Abdomen is soft. There is no mass.     Tenderness: There is no abdominal tenderness. There is no guarding or rebound.  Musculoskeletal:        General: No tenderness.  Lymphadenopathy:     Cervical: No cervical adenopathy.  Skin:    Findings: No erythema or rash.  Neurological:     Cranial Nerves: No cranial nerve deficit.     Motor: No abnormal muscle tone.     Coordination: Coordination normal.     Deep Tendon Reflexes: Reflexes normal.  Psychiatric:        Behavior: Behavior normal.        Thought Content: Thought content normal.        Judgment: Judgment normal.   obese R hamstring - tender small hernia palpable to the R from umbilicus   Lab Results  Component Value Date   WBC 5.8 02/25/2017   HGB 13.3 02/25/2017   HCT 39.7 02/25/2017   PLT 261.0 02/25/2017   GLUCOSE 104 (H) 02/25/2017   CHOL 216 (H) 02/25/2017   TRIG 69.0 02/25/2017   HDL 108.90 02/25/2017   LDLDIRECT 115.2 01/27/2013   LDLCALC 93 02/25/2017   ALT 18 02/25/2017   AST 50 (H) 02/25/2017   NA 139 02/25/2017   K 4.0 02/25/2017   CL 102 02/25/2017   CREATININE 0.66 02/25/2017   BUN 22 02/25/2017   CO2 28 02/25/2017   TSH 1.78 02/25/2017   INR 1.12 02/24/2015   HGBA1C 5.4 12/10/2016    Dg Bone Density  Result Date: 03/20/2017 Date of study: 03/18/2017 Exam: DUAL X-RAY ABSORPTIOMETRY (DXA) FOR BONE MINERAL DENSITY (BMD) Instrument: [employer] Requesting Provider: Dr. Dellis Filbert Indication: screening for osteoporosis Comparison: 02/05/2013 Clinical data: Pt is a 57 y.o. female with previous foot fracture. On Calcium and vitamin D. Results:  Lumbar spine L1-L4 Femoral neck (FN) T-score -0.1 RFN: -0.2 LFN: +0.1 Change in BMD from previous DXA  test (%) -12.7%* -12.3%* (*) statistically significant Assessment: the BMD is normal according to the [facility] classification for osteoporosis (see below). Fracture risk: low FRAX score: not calculated due to normal BMD Comments: the technical quality of the study is good Recommend optimizing calcium (1200 mg/day) and  vitamin D (800 IU/day) intake. No pharmacological treatment is indicated. Followup: Repeat BMD is appropriate after 2 years. WHO criteria for diagnosis of osteoporosis in postmenopausal women and in men 69 y/o or older: - normal: T-score -1.0 to + 1.0 - osteopenia/low bone density: T-score between -2.5 and -1.0 - osteoporosis: T-score below -2.5 - severe osteoporosis: T-score below -2.5 with history of fragility fracture Note: although not part of the WHO classification, the presence of a fragility fracture, regardless of the T-score, should be considered diagnostic of osteoporosis, provided other causes for the fracture have been excluded. Philemon Kingdom, MD [facility]    Assessment & Plan:   There are no diagnoses linked to this encounter.   No orders of the defined types were placed in this encounter.    Follow-up: No follow-ups on file.  Daris Kehr, MD

## 2018-02-24 NOTE — Assessment & Plan Note (Signed)
Vit B12 

## 2018-02-24 NOTE — Assessment & Plan Note (Signed)
We discussed age appropriate health related issues, including available/recomended screening tests and vaccinations. We discussed a need for adhering to healthy diet and exercise. Labs were ordered to be later reviewed . All questions were answered.   

## 2018-02-24 NOTE — Assessment & Plan Note (Signed)
Labs

## 2018-02-24 NOTE — Patient Instructions (Signed)
Hamstring Strain Rehab Ask your health care provider which exercises are safe for you. Do exercises exactly as told by your health care provider and adjust them as directed. It is normal to feel mild stretching, pulling, tightness, or discomfort as you do these exercises, but you should stop right away if you feel sudden pain or your pain gets worse.Do not begin these exercises until told by your health care provider. Strengthening exercises These exercises build strength and endurance in your thighs. Endurance is the ability to use your muscles for a long time, even after your muscles get tired. Exercise A: Straight leg raises (hip extensors)  1. Lie on your belly on a firm surface. 2. Tense the muscles in your buttocks to lift your left / right leg about 4 inches (10 cm). Keep your knee straight. 3. If you cannot lift your leg this high without arching your back, place a pillow under your hips. 4. Hold the position for __________ seconds. 5. Slowly lower your leg to the starting position and allow it to relax completely before you start the next repetition. Repeat __________ times. Complete this exercise __________ times a day. Exercise B: Bridge (hip extensors)  1. Lie on your back on a firm surface with your knees bent and your feet flat on the floor. 2. Tighten your buttocks muscles and lift your bottom off the floor until your trunk is level with your thighs. ? You should feel the muscles working in your buttocks and the back of your thighs. ? Do not arch your back. 3. Hold this position for __________ seconds. 4. Slowly lower your hips to the starting position. 5. Let your buttocks muscles relax completely between repetitions. Repeat __________ times. Complete this exercise __________ times a day. If told by your health care provider, keep your bottom lifted off the floor while you slowly walk your feet away from you as far as you can control. Hold for __________ seconds, then slowly  walk your feet back toward you. Exercise C: Lateral walking with band (hip abductors) 1. Stand in a long hallway. 2. Wrap a loop of exercise band around your legs, just above your knees. 3. Bend your knees gently and drop your hips down and back so your weight is over your heels. 4. Step to the side to move down the length of the hallway, keeping your toes pointed ahead of you and keeping tension in the band. 5. Repeat, leading with your other leg. Repeat __________ times. Complete this exercise __________ times a day. Exercise D: Single leg stand with reaching (eccentric hamstring) 1. Stand on your left / right foot. Keep your big toe down on the floor and try to keep your arch lifted. 2. Slowly reach down toward the floor as far as you can while keeping your balance. 3. Hold this position for __________ seconds. Repeat __________ times. Complete this exercise __________ times a day. Exercise E: Prone plank (abdominals and core) 1. Lie on your belly on the floor and prop yourself up on your elbows. Your hands should be straight out in front of you, and your elbows should be below your shoulders. Position your feet so the balls of your feet touch the ground. The ball of your foot is on the walking surface, right under your toes. 2. Tighten your abdominal muscles and lift your body off the floor. ? Do not arch your back. ? Do not hold your breath. 3. Hold this position for __________ seconds. Repeat __________ times. Complete this  exercise __________ times a day. This information is not intended to replace advice given to you by your health care provider. Make sure you discuss any questions you have with your health care provider. Document Released: 01/21/2005 Document Revised: 09/28/2015 Document Reviewed: 10/20/2014 Elsevier Interactive Patient Education  2019 [employer]. Hamstring Strain  A hamstring strain happens when the muscles in the back of the thighs (hamstring muscles) are  overstretched or torn. The hamstring muscles are used in straightening the hips, bending the knees, and pulling back the legs. This injury is often called a pulled hamstring muscle. The tissue that connects the muscle to a bone (tendon) may also be affected. The severity of a hamstring strain may be rated in degrees or grades. First-degree (or grade 1) strains have the least amount of muscle tearing and pain. Second-degree and third-degree (grade 2 and 3) strains have increasingly more tearing and pain. What are the causes? This condition is caused by a sudden, violent force being placed on the hamstring muscles, stretching them too far. This often happens during activities that involve running, jumping, kicking, or weight lifting. What increases the risk? Hamstring strains are especially common in athletes. The following factors may also make you more likely to develop this condition:  Having low strength, endurance, or flexibility of the hamstring muscles.  Doing high-impact physical activity or sports.  Having poor physical fitness.  Having a previous leg injury.  Having tired (fatigued) muscles. What are the signs or symptoms? Symptoms of this condition include:  Pain in the back of the thigh.  Swelling.  Bruising.  Muscle spasms.  Trouble moving the affected muscle because of pain. For severe strains, you may feel popping or snapping in the back of your thigh when the injury occurs. How is this diagnosed? This condition is diagnosed based on your symptoms, your medical history, and a physical exam. How is this treated? Treatment for this condition usually involves:  Protecting, resting, icing, applying compression, and elevating the injured area (PRICE therapy).  Medicines. Your health care provider may recommend medicines to help reduce pain or inflammation.  Doing exercises to regain strength and flexibility in the muscles. Your health care provider will tell you when it  is okay to begin exercising. Follow these instructions at home: PRICE therapy Use PRICE therapy to promote muscle healing during the first 2-3 days after your injury, or as told by your health care provider.  Protect the muscle from being injured again.  Rest your injury. This usually involves limiting your normal activities and not using the injured hamstring muscle. Talk with your health care provider about how you should limit your activities.  Apply ice to the injured area: ? Put ice in a plastic bag. ? Place a towel between your skin and the bag. ? Leave the ice on for 20 minutes, 2-3 times a day. After the third day, switch to applying heat as told.  Put pressure (compression) on your injured hamstring by wrapping it with an elastic bandage. Be careful not to wrap it too tightly. That may interfere with blood circulation or may increase swelling.  Raise (elevate) your injured hamstring above the level of your heart as often as possible. When you are lying down, you can do this by putting a pillow under your thigh.  Activity  Begin exercising or stretching only as told by your health care provider.  Do not return to full activity level until your health care provider approves.  To help prevent muscle  strains in the future, always warm up before exercising and stretch afterward. General instructions  Take over-the-counter and prescription medicines only as told by your health care provider.  If directed, apply heat to the affected area as often as told by your health care provider. Use the heat source that your health care provider recommends, such as a moist heat pack or a heating pad. ? Place a towel between your skin and the heat source. ? Leave the heat on for 20-30 minutes. ? Remove the heat if your skin turns bright red. This is especially important if you are unable to feel pain, heat, or cold. You may have a greater risk of getting burned.  Keep all follow-up visits as  told by your health care provider. This is important. Contact a health care provider if you have:  Increasing pain or swelling in the injured area.  Numbness, tingling, or a significant loss of strength in the injured area. Get help right away if:  Your foot or your toes become cold or turn blue. Summary  A hamstring strain happens when the muscles in the back of the thighs (hamstring muscles) are overstretched or torn.  This injury can be caused by a sudden, violent force being placed on the hamstring muscles, causing them to stretch too far.  Symptoms include pain, swelling, and muscle spasms in the injured area.  Treatment includes what is called PRICE therapy: protecting, resting, icing, applying compression, and elevating the injured area. This information is not intended to replace advice given to you by your health care provider. Make sure you discuss any questions you have with your health care provider. Document Released: 10/16/2000 Document Revised: 12/19/2016 Document Reviewed: 12/19/2016 Elsevier Interactive Patient Education  2019 [employer].

## 2018-02-24 NOTE — Assessment & Plan Note (Signed)
Discussed.

## 2018-02-25 ENCOUNTER — Telehealth: Payer: Self-pay | Admitting: Internal Medicine

## 2018-02-25 MED ORDER — PANTOPRAZOLE SODIUM 40 MG PO TBEC: 40.0000 mg | DELAYED_RELEASE_TABLET | Freq: Every day | ORAL | 3 refills | Status: DC

## 2018-02-25 MED ORDER — ROPINIROLE HCL 0.25 MG PO TABS: 0.7500 mg | ORAL_TABLET | Freq: Every day | ORAL | 3 refills | Status: DC

## 2018-02-25 MED FILL — PANTOPRAZOLE SOD DR 40 MG T: 40 | 90 days supply | Qty: 90 | Fill #0

## 2018-02-25 MED FILL — rOPINIRole HCL 0.25 MG TABS: 0.25 | 90 days supply | Qty: 270 | Fill #0

## 2018-02-25 NOTE — Telephone Encounter (Signed)
Patient requesting refill on requip and protonix to be sent to [facility] long outpatient.

## 2018-02-25 NOTE — Telephone Encounter (Signed)
sent 

## 2018-02-26 ENCOUNTER — Other Ambulatory Visit (INDEPENDENT_AMBULATORY_CARE_PROVIDER_SITE_OTHER): Payer: 59

## 2018-02-26 DIAGNOSIS — E538 Deficiency of other specified B group vitamins: Secondary | ICD-10-CM

## 2018-02-26 DIAGNOSIS — Z0000 Encounter for general adult medical examination without abnormal findings: Secondary | ICD-10-CM

## 2018-02-26 DIAGNOSIS — R7303 Prediabetes: Secondary | ICD-10-CM

## 2018-02-26 DIAGNOSIS — M79604 Pain in right leg: Secondary | ICD-10-CM | POA: Diagnosis not present

## 2018-02-26 LAB — HEPATIC FUNCTION PANEL
ALBUMIN: 4.2 g/dL (ref 3.5–5.2)
ALT: 22 U/L (ref 0–35)
AST: 59 U/L — ABNORMAL HIGH (ref 0–37)
Alkaline Phosphatase: 118 U/L — ABNORMAL HIGH (ref 39–117)
Bilirubin, Direct: 0.1 mg/dL (ref 0.0–0.3)
Total Bilirubin: 0.4 mg/dL (ref 0.2–1.2)
Total Protein: 7.2 g/dL (ref 6.0–8.3)

## 2018-02-26 LAB — BASIC METABOLIC PANEL
BUN: 20 mg/dL (ref 6–23)
CO2: 28 mEq/L (ref 19–32)
Calcium: 9.6 mg/dL (ref 8.4–10.5)
Chloride: 103 mEq/L (ref 96–112)
Creatinine, Ser: 0.77 mg/dL (ref 0.40–1.20)
GFR: 77.3 mL/min (ref >60.00)
Glucose, Bld: 95 mg/dL (ref 70–99)
Potassium: 3.8 mEq/L (ref 3.5–5.1)
Sodium: 140 mEq/L (ref 135–145)

## 2018-02-26 LAB — LIPID PANEL
CHOL/HDL RATIO: 2
Cholesterol: 228 mg/dL — ABNORMAL HIGH (ref 0–200)
HDL: 108.4 mg/dL (ref >39.00)
LDL Cholesterol: 108 mg/dL — ABNORMAL HIGH (ref 0–99)
NonHDL: 119.37
Triglycerides: 58 mg/dL (ref 0.0–149.0)
VLDL: 11.6 mg/dL (ref 0.0–40.0)

## 2018-02-26 LAB — CBC WITH DIFFERENTIAL/PLATELET
BASOS PCT: 0.6 % (ref 0.0–3.0)
Basophils Absolute: 0 10*3/uL (ref 0.0–0.1)
Eosinophils Absolute: 0.1 10*3/uL (ref 0.0–0.7)
Eosinophils Relative: 1.7 % (ref 0.0–5.0)
HCT: 38.8 % (ref 36.0–46.0)
Hemoglobin: 13.2 g/dL (ref 12.0–15.0)
Lymphocytes Relative: 19.8 % (ref 12.0–46.0)
Lymphs Abs: 1.5 10*3/uL (ref 0.7–4.0)
MCHC: 34 g/dL (ref 30.0–36.0)
MCV: 85.4 fl (ref 78.0–100.0)
Monocytes Absolute: 0.5 10*3/uL (ref 0.1–1.0)
Monocytes Relative: 6.5 % (ref 3.0–12.0)
Neutro Abs: 5.3 10*3/uL (ref 1.4–7.7)
Neutrophils Relative %: 71.4 % (ref 43.0–77.0)
Platelets: 245 10*3/uL (ref 150.0–400.0)
RBC: 4.55 Mil/uL (ref 3.87–5.11)
RDW: 13.7 % (ref 11.5–15.5)
WBC: 7.4 10*3/uL (ref 4.0–10.5)

## 2018-02-26 LAB — TSH: TSH: 0.65 u[IU]/mL (ref 0.35–4.50)

## 2018-02-26 LAB — VITAMIN B12: VITAMIN B 12: 606 pg/mL (ref 211–911)

## 2018-02-26 LAB — HEMOGLOBIN A1C: HEMOGLOBIN A1C: 5.5 % (ref 4.6–6.5)

## 2018-02-26 LAB — VITAMIN D 25 HYDROXY (VIT D DEFICIENCY, FRACTURES): VITD: 61.35 ng/mL (ref 30.00–100.00)

## 2018-03-11 ENCOUNTER — Encounter: Payer: Self-pay | Admitting: Internal Medicine

## 2018-03-11 ENCOUNTER — Ambulatory Visit (INDEPENDENT_AMBULATORY_CARE_PROVIDER_SITE_OTHER): Payer: 59 | Admitting: Internal Medicine

## 2018-03-11 VITALS — BP 110/80 | HR 65 | Temp 98.0°F

## 2018-03-11 DIAGNOSIS — J101 Influenza due to other identified influenza virus with other respiratory manifestations: Secondary | ICD-10-CM

## 2018-03-11 LAB — POCT INFLUENZA A/B
Influenza A, POC: POSITIVE — AB
Influenza B, POC: NEGATIVE

## 2018-03-11 MED ORDER — PHENYLEPHRINE HCL 1 % NA SOLN
3.0000 [drp] | NASAL | Status: AC
  Administered 2018-03-11: 3 [drp] via NASAL

## 2018-03-11 MED ORDER — AZITHROMYCIN 250 MG PO TABS: ORAL_TABLET | ORAL | 0 refills | Status: DC

## 2018-03-11 MED FILL — AZITHROMYCIN 250 MG TABS: 250 | 5 days supply | Qty: 6 | Fill #0

## 2018-03-11 NOTE — Assessment & Plan Note (Signed)
She is 4 days into this illness, which is past the likely beneficial range of Tamiflu.  Symptoms are mild, suggesting that her flu vaccine may have blunted to the severity.  She will be given a Z-Pak prescription printed, to hold in case of progressive symptoms later in the course. Plan-leave of absence through this week, stay well-hydrated, avoid fatigue, symptomatic OTC therapies as needed, call if needed.

## 2018-03-11 NOTE — Progress Notes (Signed)
03/11/2018-58 year old female never smoker, office employee,   Documentation Only Acute visit-4 days ago awoke with headache, has been having cough productive scant clear/yellow sputum, fever/sweats.  Not much sore throat or myalgias, adenopathy or other somatic symptoms.  Did have flu shot. Nasal flu swab 03/11/2018-Positive Influenza A   Prior to Admission medications   Medication Sig Start Date End Date Taking? Authorizing Provider  Aspirin-Caffeine (BAYER BACK & BODY) 500-32.5 MG TABS Take by mouth.    [provider]  azithromycin (ZITHROMAX) 250 MG tablet 2 today then one daily 03/11/18   Baird Lyons D, MD  B Complex-Folic Acid (B COMPLEX-VITAMIN B12 PO) Take 5,000 mcg by mouth once a week.    [provider]  Calcium-Vitamin D-Vitamin K ([facility]S CALCIUM SOFT CHEWS PO) Take 1,200 mg by mouth daily.    [provider]  Cetirizine-Pseudoephedrine (ZYRTEC-D PO) Take 1 tablet by mouth every morning.     [provider]  Cholecalciferol (VITAMIN D-3) 1000 UNITS CAPS Take 2 capsules by mouth 3 (three) times a week.     [provider]  fluticasone (FLONASE) 50 MCG/ACT nasal spray Place 2 sprays into both nostrils at bedtime.    [provider]  guaiFENesin (MUCINEX) 600 MG 12 hr tablet Take 600 mg by mouth 2 (two) times daily.    [provider]  Homeopathic Products (CVS LEG CRAMPS PAIN RELIEF PO) Take by mouth daily as needed.    [provider]  Ibuprofen (ADVIL) 200 MG CAPS Take by mouth every 6 (six) hours as needed.    [provider]  magnesium oxide (MAG-OX) 400 MG tablet Take 400 mg by mouth 3 (three) times a week. Mon,Wed,Fri    [provider]  Misc Natural Products (HEALTHY LIVER PO) Take by mouth.    [provider]  Misc Natural Products (HM JOINT HEALTH ULTRA PO) Take by mouth.    [provider]  Multiple Minerals-Vitamins (CVS CALCIUM 600 PLUS) CHEW Chew 1 each by mouth daily.     [provider]  Multiple Vitamins-Minerals (CENTRUM VITAMINTS PO) Take 1 each by mouth 2 (two) times daily.     [provider]  pantoprazole (PROTONIX) 40 MG tablet Take 1 tablet (40 mg total) by mouth daily. 02/25/18   Plotnikov, Evie Lacks, MD  polyethylene glycol (MIRALAX / GLYCOLAX) packet Take 17 g by mouth at bedtime.    [provider]  rOPINIRole (REQUIP) 0.25 MG tablet Take 3 tablets (0.75 mg total) by mouth at bedtime. 02/25/18   Plotnikov, Evie Lacks, MD   Past Medical History:  Diagnosis Date  . Allergic rhinitis    uses OTC meds  . Allergy   . Arthritis    arthritis -knees ,ankles  . Back pain   . Complication of anesthesia   . Constipation   . Depression   . Esophagitis   . Fatty liver   . Gallbladder problem   . GERD (gastroesophageal reflux disease)   . Gestational HTN   . History of sinusitis    no current problems  . Leg edema   . Obesity   . Obstructive sleep apnea    no cpap currently.  Marland Kitchen PONV (postoperative nausea and vomiting)   . Restless leg syndrome   . Sleep apnea    no cpap  . Tonsillitis    nothing recent   Past Surgical History:  Procedure Laterality Date  . bilat salpingectomy    . CERVICAL BIOPSY    .  CESAREAN SECTION    . HEMORRHOID SURGERY    . LAPAROSCOPIC CHOLECYSTECTOMY  1992  . LAPAROSCOPIC GASTRIC SLEEVE RESECTION N/A 04/05/2014   Procedure: LAPAROSCOPIC GASTRIC SLEEVE RESECTION with hiatel hernia repair;  Surgeon: Pedro Earls, MD;  Location: [facility];  Service: General;  Laterality: N/A;  . [place]  . UPPER GASTROINTESTINAL ENDOSCOPY    . UPPER GI ENDOSCOPY  04/05/2014   Procedure: UPPER GI ENDOSCOPY;  Surgeon: Pedro Earls, MD;  Location: [facility];  Service: General;;   Family History  Problem Relation Age of Onset  . Asthma Mother   . COPD Mother   . Cancer Mother 35       PERITONEAL   . Allergies Mother        ? seasonal  . Stroke Mother   . Thyroid disease Mother   .  Hypertension Father   . Obesity Father   . Glaucoma Father   . Atrial fibrillation Father   . Sleep apnea Father   . Cancer Maternal Uncle        >20 colon polyps  . Colon polyps Maternal Uncle   . Cancer Paternal Uncle        blood cancer  . Cancer Paternal Grandfather        lymphoma  . Esophageal cancer Neg Hx   . Rectal cancer Neg Hx   . Stomach cancer Neg Hx   . Colon cancer Neg Hx    Social History   Socioeconomic History  . Marital status: Divorced    Spouse name: Not on file  . Number of children: 2  . Years of education: Not on file  . Highest education level: Not on file  Occupational History  . 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 Financial resource strain: Not on file  . Food insecurity:    Worry: Not on file    Inability: Not on file  . Transportation needs:    Medical: Not on file    Non-medical: Not on file  Tobacco Use  . Smoking status: Never Smoker  . Smokeless tobacco: Never Used  Substance and Sexual Activity  . Alcohol use: Yes    Alcohol/week: 0.0 standard drinks    Comment: rare occasional use  . Drug use: No  . Sexual activity: Not Currently    Partners: Male    Comment: 1st intercourse- 73, partners- 80,   Lifestyle  . Physical activity:    Days per week: Not on file    Minutes per session: Not on file  . Stress: Not on file  Relationships  . Social connections:    Talks on phone: Not on file    Gets together: Not on file    Attends religious service: Not on file    Active member of club or organization: Not on file    Attends meetings of clubs or organizations: Not on file    Relationship status: Not on file  . Intimate partner violence:    Fear of current or ex partner: Not on file    Emotionally abused: Not on file    Physically abused: Not on file    Forced sexual activity: Not on file  Other Topics Concern  . Not on file  Social History Narrative   HSG. Married 71- divorce  '89. 2 daughters '86, 88. Work Educational psychologist. Lives in own home daughters at home   ROS-see HPI   + =  positive Constitutional:    weight loss,+ night sweats, fevers, chills, fatigue, lassitude. HEENT:    +headaches, difficulty swallowing, tooth/dental problems, sore throat,       sneezing, itching, ear ache, +nasal congestion, post nasal drip, snoring CV:    chest pain, orthopnea, PND, swelling in lower extremities, anasarca,                                  dizziness, palpitations Resp:   shortness of breath with exertion or at rest.               + productive cough,   non-productive cough, coughing up of blood.              change in color of mucus.  wheezing.   Skin:    rash or lesions. GI:  No-   heartburn, indigestion, abdominal pain, nausea, vomiting, diarrhea,                 change in bowel habits, loss of appetite GU: dysuria, change in color of urine, no urgency or frequency.   flank pain. MS:   joint pain, stiffness, decreased range of motion, back pain. Neuro-     nothing unusual Psych:  change in mood or affect.  depression or anxiety.   memory loss.  OBJ- Physical Exam General- Alert, Oriented, Affect-appropriate, Distress- + mild, + overweight Skin- rash-none, lesions- none, excoriation- none Lymphadenopathy- none Head- atraumatic            Eyes- Gross vision intact, PERRLA, conjunctivae and secretions clear            Ears- Hearing, canals-normal            Nose- Clear, no-Septal dev, mucus, polyps, erosion, perforation             Throat- Mallampati II , mucosa clear , drainage- none, tonsils- atrophic Neck- flexible , trachea midline, no stridor , thyroid nl, carotid no bruit Chest - symmetrical excursion , unlabored           Heart/CV- RRR , no murmur , no gallop  , no rub, nl s1 s2                           - JVD- none , edema- none, stasis changes- none, varices- none           Lung- clear to P&A, wheeze- none, cough- none , dullness-none, rub- none            Chest wall-  Abd-  Br/ Gen/ Rectal- Not done, not indicated Extrem- cyanosis- none, clubbing, none, atrophy- none, strength- nl Neuro- grossly intact to observation

## 2018-03-12 ENCOUNTER — Ambulatory Visit: Payer: 59 | Admitting: Internal Medicine

## 2018-03-15 ENCOUNTER — Encounter: Payer: Self-pay | Admitting: Internal Medicine

## 2018-04-16 ENCOUNTER — Encounter: Payer: 59 | Attending: Internal Medicine | Admitting: Registered"

## 2018-04-16 ENCOUNTER — Other Ambulatory Visit: Payer: Self-pay

## 2018-04-16 DIAGNOSIS — E669 Obesity, unspecified: Secondary | ICD-10-CM | POA: Diagnosis not present

## 2018-04-16 NOTE — Progress Notes (Signed)
Follow-up visit: 4 Years Post-Operative Sleeve Gastrectomy Surgery  Medical Nutrition Therapy:  Appt start time: 8:58 end time: 9:45  Primary concerns today: Post-operative Bariatric Surgery Nutrition Management.   Pre-surgery Goals: to get healthier and live longer  Expectations: wants to know what she needs to do to keep losing weight. Pt reports goal weight of 140-150 lbs.   Pt states she has increased 40 lbs since lowest point (~168 lbs 2016-2017) and not happy about it. Pt states she is still going to the gym; even though she does not like it. Pt states part of the problem is the sweets because she loves them; although did not mention sweets in dietary recall. Pt states she would rather eat sweets rather than food. Pt is very concerned with calories; states she does not know how much protein is in each item she eats. Pt reports history of dieting and dieting mindset which and states that's why she is more focused on calories. Pt states she bought probiotic to help burn fat; got the flu for the first time shortly after taking it. Pt changes the subject when I mention diets, dieting products, and the market for them.   Non scale victories: none stated  Surgery date: 04/05/2014 Surgery type: Sleeve gastrectomy Start weight at [facility]: 269 lbs on 01/08/2014 Weight today: 207.6 lbs Weight change: 16.2 lbs gain from 191.4 (07/15/2017) Total weight lost: 61.4 lbs loss Weight loss goal: 140-150 lbs   Body Composition Scale  Total Body Fat: 43.5 %  Visceral Fat: 15  Fat-Free Mass:  56.4 %   Total Body Water:  42.7 %  Muscle-Mass: 28.2 lbs  Body Fat Displacement:  Torso:  55.9 lbs Left Leg: 11.1 lbs Right Leg: 11.1 lbs Left Arm: 5.5 lbs Right Arm: 5.5 lbs    TANITA  BODY COMP RESULTS  04/04/2016 10/15/2016 04/15/2017 07/15/2017   BMI (kg/m^2) 30.8 32.6 34.6 35.1   Fat Mass (lbs) 63.6 72.4 79.4 78.2   Fat Free Mass (lbs) 110.4 111.8 110.0 113.6   Total Body Water (lbs) 77.8  79.4 78.4 80.4     Surgery date: 04/05/2014 Surgery type: Gastric sleeve Start weight at [facility]: 269 lbs on 01/08/2014 Weight today: 168.8 lbs Weight change: 1.2 lbs  Total weight loss: 100.2 lbs  TANITA BODY COMP RESULTS  03/14/14 04/19/14 05/31/14 06/27/14 09/27/14 12/12/14 01/03/15 04/05/15 07/11/15  BMI (kg/m^2) 47.7 43.0 40.7 38.8 34.9 33.0 32.7 30.1 29.9  Fat Mass (lbs) 134.5 123.5 98.5 99.5 79.0 63.0 71.0 57.5 56.8  Fat Free Mass (lbs) 135 119.0 131.5 119.5 118.0 123.5 113.5 112.5 112  Total Body Water (lbs) 99 87.0 96.5 87.5 86.5 90.5 83.0 82.5 78.8                  Preferred Learning Style:   No preference indicated   Learning Readiness:   Ready  24-hr recall: B (6 AM): protein shake (30g) + popcorn  Snk (AM):  L (PM): chicken marsala (21-28g) + salad  Snk (PM): none D (PM): greek yogurt (12-15g), peanuts (7g)  + almonds (7g) Snk (PM):   Fluid intake: water, 64+ oz Estimated total protein intake: 60+ g  Medications:  See list  Supplementation: Centrum Complete 2x/day, 2 Ca once a day; B complex liquid once/week, Vitamin D  Using straws: occasionally when driving Drinking while eating: no Hair loss: no Carbonated beverages: no N/V/D/C/GAS:no, no, no, yes: taking miralax daily or every other day to keep her regular, no Dumping syndrome: No Having you been  chewing well: yes Chewing/swallowing difficulties:  no Changes in vision: no Changes to mood/headaches:  no Hair loss/Changes to skin/Changes to nails: no Any difficulty focusing or concentrating: no Sweating:  no Dizziness/Lightheaded: when working out sometimes Palpitations: no Abdominal Pain: no  Recent physical activity:  [employer] program MWF 30 min cardio 50 min strength training;  yardwork and other activities occasionally (walking and kayaking)  Progress Towards Goal(s):  In progress.   Nutritional Diagnosis:  [place]-3.3 Overweight/obesity  related to past poor dietary habits and physical inactivity as evidenced by patient w/ recent sleeve gastrectomy surgery following dietary guidelines for continued weight loss.    Intervention:  Nutrition education/diet reinforcement. Pt was educated on disordered thoughts related to food, diet culture and importance of focusing on health rather than weight. Pt was reminded of post-bariatric surgery goals.  Goals: - Aim to exercise at least 5 days a week, having non-consecutive rest days.  - Look into doing some pool workouts.  - Try not to focus on the numbers.  - Find a mental health provider that you connect.   Teaching Method Utilized:  Visual Auditory Hands on  Barriers to learning/adherence to lifestyle change: none  Demonstrated degree of understanding via:  Teach Back   Monitoring/Evaluation:  Dietary intake, exercise, and body weight.

## 2018-05-18 MED FILL — PANTOPRAZOLE SOD DR 40 MG T: 40 | 90 days supply | Qty: 90 | Fill #0

## 2018-05-18 MED FILL — rOPINIRole HCL 0.25 MG TABS: 0.25 | 90 days supply | Qty: 270 | Fill #1

## 2018-07-16 ENCOUNTER — Ambulatory Visit: Payer: 59 | Admitting: Registered"

## 2018-07-29 ENCOUNTER — Ambulatory Visit: Payer: 59 | Admitting: Registered"

## 2018-08-19 MED FILL — rOPINIRole HCL 0.25 MG TABS: 0.25 | 90 days supply | Qty: 270 | Fill #2

## 2018-08-19 MED FILL — PANTOPRAZOLE SOD DR 40 MG T: 40 | 90 days supply | Qty: 90 | Fill #1

## 2018-09-01 ENCOUNTER — Encounter: Payer: Self-pay | Admitting: Registered"

## 2018-09-01 ENCOUNTER — Other Ambulatory Visit: Payer: Self-pay

## 2018-09-01 ENCOUNTER — Encounter: Payer: 59 | Attending: Surgery | Admitting: Registered"

## 2018-09-01 DIAGNOSIS — E669 Obesity, unspecified: Secondary | ICD-10-CM | POA: Insufficient documentation

## 2018-09-01 NOTE — Patient Instructions (Signed)
-   Aim to do workout video Monday mornings before work.   - Look into getting new walking/running shoes.  - Keep up the great work meeting protein and fluid needs.

## 2018-09-01 NOTE — Progress Notes (Signed)
Follow-up visit: 4 Years Post-Operative Sleeve Gastrectomy Surgery  Medical Nutrition Therapy:  Appt start time: 9:14 end time: 10:01  Primary concerns today: Post-operative Bariatric Surgery Nutrition Management.   Pre-surgery Goals: to get healthier and live longer  Expectations: wants to know what she needs to do to keep losing weight. Pt reports goal weight of 140-150 lbs.   Pt states she has been trying to maintain well during the pandemic. States she has not been taking multivitamin because she ran out. States she has been struggling with physical activity. Has not been able to access [employer] recently due to no Internet access. States she does not have Internet at home and does not have a smart phone. 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 States she has been participating since Aug 2016 until it ended in March 2020. Reports more heartburn lately; sometimes notices it when she eats bar after dinner before going to bed. Reports she can walk in neighborhood, a 2 mile route. States she needs new walking shoes. States she has to have something to keep her accountable like classmates from [place] class.     Pt states she has increased 40 lbs since lowest point (~168 lbs 2016-2017) and not happy about it. Pt states she is still going to the gym; even though she does not like it. Pt states part of the problem is the sweets because she loves them; although did not mention sweets in dietary recall. Pt states she would rather eat sweets rather than food. Pt is very concerned with calories; states she does not know how much protein is in each item she eats. Pt reports history of dieting and dieting mindset which and states that's why she is more focused on calories. Pt states she bought probiotic to help burn fat; got the flu for the first time shortly after taking it. Pt changes the subject when I mention diets, dieting products, and the market for them.   Non scale victories:  none stated  Surgery date: 04/05/2014 Surgery type: Sleeve gastrectomy Start weight at [facility]: 269 lbs on 01/08/2014 Weight today: 224.6, 207.6 lbs Weight change: 17 lbs gain, 16.2 lbs gain from 191.4 (07/15/2017) Total weight lost: 61.4 lbs loss Weight loss goal: 140-150 lbs   Body Composition Scale  Total Body Fat: 45.4%, 43.5 %  Visceral Fat: 17, 15  Fat-Free Mass: 54.5%,  56.4 %   Total Body Water: 41.7%,  42.7 %  Muscle-Mass: 28.4 lbs, 28.2 lbs  Body Fat Displacement:  Torso: 63.1,  55.9 lbs Left Leg: 12.6, 11.1 lbs Right Leg: 12.6, 11.1 lbs Left Arm: 6.3, 5.5 lbs Right Arm: 6.3, 5.5 lbs    TANITA  BODY COMP RESULTS  04/04/2016 10/15/2016 04/15/2017 07/15/2017   BMI (kg/m^2) 30.8 32.6 34.6 35.1   Fat Mass (lbs) 63.6 72.4 79.4 78.2   Fat Free Mass (lbs) 110.4 111.8 110.0 113.6   Total Body Water (lbs) 77.8 79.4 78.4 80.4     Surgery date: 04/05/2014 Surgery type: Gastric sleeve Start weight at [facility]: 269 lbs on 01/08/2014 Weight today: 168.8 lbs Weight change: 1.2 lbs  Total weight loss: 100.2 lbs  TANITA BODY COMP RESULTS  03/14/14 04/19/14 05/31/14 06/27/14 09/27/14 12/12/14 01/03/15 04/05/15 07/11/15  BMI (kg/m^2) 47.7 43.0 40.7 38.8 34.9 33.0 32.7 30.1 29.9  Fat Mass (lbs) 134.5 123.5 98.5 99.5 79.0 63.0 71.0 57.5 56.8  Fat Free Mass (lbs) 135 119.0 131.5 119.5 118.0 123.5 113.5 112.5 112  Total Body Water (lbs) 99  87.0 96.5 87.5 86.5 90.5 83.0 82.5 78.8                  Preferred Learning Style:   No preference indicated   Learning Readiness:   Ready  24-hr recall: B (6 AM): protein shake (30g)  Snk (AM): Bojanlge's-bacon + egg  L (PM): Marie Calender's-pot roast (20g) w/ vegetables mixed in Snk (PM): protein bar (20g)  D (PM): salad + egg + ham  Snk (PM):   Fluid intake: water, diet Coke; 64+ oz Estimated total protein intake: 60+ g  Medications:  See list  Supplementation: Centrum  Complete 2x/day, 2 Ca once a day; B complex liquid once/week, Vitamin D  Using straws: occasionally when driving Drinking while eating: no Hair loss: no Carbonated beverages: no N/V/D/C/GAS:no, no, no, yes: taking miralax daily or every other day to keep her regular, no Dumping syndrome: No Having you been chewing well: yes Chewing/swallowing difficulties:  no Changes in vision: no Changes to mood/headaches:  no Hair loss/Changes to skin/Changes to nails: no Any difficulty focusing or concentrating: no Sweating:  no Dizziness/Lightheaded: when working out sometimes Palpitations: no Abdominal Pain: no  Recent physical activity: workout videos sometimes  Progress Towards Goal(s):  In progress.   Nutritional Diagnosis:  -3.3 Overweight/obesity related to past poor dietary habits and physical inactivity as evidenced by patient w/ recent sleeve gastrectomy surgery following dietary guidelines for continued weight loss.    Intervention:  Nutrition education/diet reinforcement. Discussed motivators, ways to increase movement while at home, and triggers for heartburn. Counseled on ways to reduce heartburn prior to bedtime such as not lying down within 2-3 hours of eating and/or using pillows for elevation. Encouraged to continue meeting protein and fluid needs daily. Pt was in agreement with goals listed.    Goals: - Aim to do workout video Monday mornings before work.  - Look into getting new walking/running shoes. - Keep up the great work meeting protein and fluid needs.    Teaching Method Utilized:  Visual Auditory Hands on  Barriers to learning/adherence to lifestyle change: contemplative stage of change  Demonstrated degree of understanding via:  Teach Back   Monitoring/Evaluation:  Dietary intake, exercise, and body weight. Follow-up in 2 months.

## 2018-11-03 ENCOUNTER — Encounter: Payer: 59 | Attending: Surgery | Admitting: Registered"

## 2018-11-03 ENCOUNTER — Encounter: Payer: Self-pay | Admitting: Registered"

## 2018-11-03 ENCOUNTER — Other Ambulatory Visit: Payer: Self-pay

## 2018-11-03 DIAGNOSIS — E669 Obesity, unspecified: Secondary | ICD-10-CM | POA: Diagnosis not present

## 2018-11-03 NOTE — Progress Notes (Signed)
Follow-up visit: 4 Years Post-Operative Sleeve Gastrectomy Surgery  Medical Nutrition Therapy:  Appt start time: 9:10 end time: 10:00  Primary concerns today: Post-operative Bariatric Surgery Nutrition Management.   Pre-surgery Goals: to get healthier and live longer  Expectations: wants to know what she needs to do to keep losing weight. Pt reports goal weight of 140-150 lbs.   Pt arrives stating she plans to have her [place] moved into her living room to do DVD's she's recently ordered. States her DVD player is in the living room. States she wants to do 30 min/daily, 7 days/week with [employer]. Pt states she was working out and then started gaining weight. Reports this created frustration when doing what she's supposed it. Pt states she was told she was fine until she started gaining weight at 58 years old.   Previous visit: Pt states she has increased since lowest point (~168 lbs 2016-2017) and not happy about it. Pt reports history of dieting and dieting mindset which and states that's why she is more focused on calories. Pt states she bought probiotic to help burn fat; got the flu for the first time shortly after taking it. Pt changes the subject when I mention diets, dieting products, and the market for them.   Non scale victories: none stated  Surgery date: 04/05/2014 Surgery type: Sleeve gastrectomy Start weight at [facility]: 269 lbs on 01/08/2014 Weight today: 232.3 lbs Weight change: 24.7 lbs gain from 207.6 (09/01/2018) Total weight lost: 36.7 lbs loss Weight loss goal: 140-150 lbs   Body Composition Scale  Total Body Fat: 46.2%  Visceral Fat: 18  Fat-Free Mass: 53.7%   Total Body Water: 41.3%  Muscle-Mass: 28.5 lbs  Body Fat Displacement:  Torso: 66.4 lbs Left Leg: 13.2 lbs Right Leg: 13.2 lbs Left Arm: 6.6 lbs Right Arm: 6.6 lbs    TANITA  BODY COMP RESULTS  04/04/2016 10/15/2016 04/15/2017 07/15/2017   BMI (kg/m^2) 30.8 32.6 34.6 35.1   Fat Mass  (lbs) 63.6 72.4 79.4 78.2   Fat Free Mass (lbs) 110.4 111.8 110.0 113.6   Total Body Water (lbs) 77.8 79.4 78.4 80.4     Surgery date: 04/05/2014 Surgery type: Gastric sleeve Start weight at [facility]: 269 lbs on 01/08/2014 Weight today: 168.8 lbs Weight change: 1.2 lbs  Total weight loss: 100.2 lbs  TANITA BODY COMP RESULTS  03/14/14 04/19/14 05/31/14 06/27/14 09/27/14 12/12/14 01/03/15 04/05/15 07/11/15  BMI (kg/m^2) 47.7 43.0 40.7 38.8 34.9 33.0 32.7 30.1 29.9  Fat Mass (lbs) 134.5 123.5 98.5 99.5 79.0 63.0 71.0 57.5 56.8  Fat Free Mass (lbs) 135 119.0 131.5 119.5 118.0 123.5 113.5 112.5 112  Total Body Water (lbs) 99 87.0 96.5 87.5 86.5 90.5 83.0 82.5 78.8                  Preferred Learning Style:   No preference indicated   Learning Readiness:   Ready  24-hr recall: B (6 AM): protein shake (30g)  Snk (AM): Bojanlge's-bacon + egg  L (PM): Marie Calender's-pot roast (20g) w/ vegetables mixed in Snk (PM): protein bar (20g)  D (PM): salad + egg + ham  Snk (PM):   Fluid intake: water, diet Coke; 64+ oz Estimated total protein intake: 60+ g  Medications:  See list  Supplementation: Centrum Complete 2x/day, 2 Ca once a day; B complex liquid once/week, Vitamin D  Using straws: occasionally when driving Drinking while eating: no Hair loss: no Carbonated beverages: no N/V/D/C/GAS:no, no, no, yes: taking miralax daily or  every other day to keep her regular, no Dumping syndrome: No Having you been chewing well: yes Chewing/swallowing difficulties:  no Changes in vision: no Changes to mood/headaches:  no Hair loss/Changes to skin/Changes to nails: no Any difficulty focusing or concentrating: no Sweating:  no Dizziness/Lightheaded: when working out sometimes Palpitations: no Abdominal Pain: no  Recent physical activity: workout videos sometimes, 1-2 days/week  Progress Towards Goal(s):  In  progress.   Nutritional Diagnosis:  [place]-3.3 Overweight/obesity related to past poor dietary habits and physical inactivity as evidenced by patient w/ recent sleeve gastrectomy surgery following dietary guidelines for continued weight loss.    Intervention:  Nutrition education and counseling. Discussed role and importance of physical activity. Pt was in agreement with goals listed.    Goals: - Increase physical activity to at least 3 days/week.    Teaching Method Utilized:  Visual Auditory Hands on  Barriers to learning/adherence to lifestyle change: contemplative stage of change  Demonstrated degree of understanding via:  Teach Back   Monitoring/Evaluation:  Dietary intake, exercise, and body weight. Follow-up in 6 months.

## 2018-11-03 NOTE — Patient Instructions (Addendum)
-   Increase physical activity to at least 3 days/week.

## 2018-11-18 MED FILL — rOPINIRole HCL 0.25 MG TABS: 0.25 | 90 days supply | Qty: 270 | Fill #3

## 2018-11-18 MED FILL — PANTOPRAZOLE SOD DR 40 MG T: 40 | 90 days supply | Qty: 90 | Fill #2

## 2018-12-08 ENCOUNTER — Encounter: Payer: Self-pay | Admitting: Obstetrics & Gynecology

## 2018-12-08 DIAGNOSIS — Z1231 Encounter for screening mammogram for malignant neoplasm of breast: Secondary | ICD-10-CM | POA: Diagnosis not present

## 2019-01-07 ENCOUNTER — Telehealth: Payer: Self-pay | Admitting: Internal Medicine

## 2019-01-07 NOTE — Telephone Encounter (Signed)
FYI

## 2019-01-07 NOTE — Telephone Encounter (Signed)
Patient was informed by [facility] to inform PCP of positive COVID results

## 2019-01-07 NOTE — Telephone Encounter (Signed)
Hope she does not get sick Thx

## 2019-02-16 ENCOUNTER — Ambulatory Visit: Payer: 59

## 2019-02-23 ENCOUNTER — Ambulatory Visit (INDEPENDENT_AMBULATORY_CARE_PROVIDER_SITE_OTHER): Payer: 59 | Admitting: Obstetrics & Gynecology

## 2019-02-23 ENCOUNTER — Other Ambulatory Visit: Payer: Self-pay

## 2019-02-23 ENCOUNTER — Encounter: Payer: Self-pay | Admitting: Obstetrics & Gynecology

## 2019-02-23 VITALS — BP 124/78 | Ht 62.0 in | Wt 241.0 lb

## 2019-02-23 DIAGNOSIS — Z780 Asymptomatic menopausal state: Secondary | ICD-10-CM | POA: Diagnosis not present

## 2019-02-23 DIAGNOSIS — Z6841 Body Mass Index (BMI) 40.0 and over, adult: Secondary | ICD-10-CM | POA: Diagnosis not present

## 2019-02-23 DIAGNOSIS — Z01419 Encounter for gynecological examination (general) (routine) without abnormal findings: Secondary | ICD-10-CM

## 2019-02-23 NOTE — Progress Notes (Signed)
Suzanne Schaefer March 20, 1960 [id number]   History:    59 y.o. G2P2L2 Divorced.  2 children in their 2s.  RP:  Established patient presenting for annual gyn exam   HPI: Abstinent x 2013.  H/O Vulvar warts back then, no recurrence in the past several years.  Menopause, well on no hormone replacement therapy.  No postmenopausal bleeding.  No pelvic pain.  Urine and bowel movements normal.  Breast normal. Body mass index 37.49 last year, now increased to 44.08.  Had Bariatric surgery in 2016.  Will see Nutritionist this spring.  Health labs with family physician.  Past medical history,surgical history, family history and social history were all reviewed and documented in the EPIC chart.  Gynecologic History No LMP recorded. (Menstrual status: Perimenopausal).  Obstetric History OB History  Gravida Para Term Preterm AB Living  2 2       2   SAB TAB Ectopic Multiple Live Births               # Outcome Date GA Lbr Len/2nd Weight Sex Delivery Anes PTL Lv  2 Para           1 Para              ROS: A ROS was performed and pertinent positives and negatives are included in the history.  GENERAL: No fevers or chills. HEENT: No change in vision, no earache, sore throat or sinus congestion. NECK: No pain or stiffness. CARDIOVASCULAR: No chest pain or pressure. No palpitations. PULMONARY: No shortness of breath, cough or wheeze. GASTROINTESTINAL: No abdominal pain, nausea, vomiting or diarrhea, melena or bright red blood per rectum. GENITOURINARY: No urinary frequency, urgency, hesitancy or dysuria. MUSCULOSKELETAL: No joint or muscle pain, no back pain, no recent trauma. DERMATOLOGIC: No rash, no itching, no lesions. ENDOCRINE: No polyuria, polydipsia, no heat or cold intolerance. No recent change in weight. HEMATOLOGICAL: No anemia or easy bruising or bleeding. NEUROLOGIC: No headache, seizures, numbness, tingling or weakness. PSYCHIATRIC: No depression, no loss of interest in normal activity or  change in sleep pattern.     Exam:   BP 124/78   Ht 5\' 2"  (1.575 m)   Wt 241 lb (109.3 kg)   BMI 44.08 kg/m   Body mass index is 44.08 kg/m.  General appearance : Well developed well nourished female. No acute distress HEENT: Eyes: no retinal hemorrhage or exudates,  Neck supple, trachea midline, no carotid bruits, no thyroidmegaly Lungs: Clear to auscultation, no rhonchi or wheezes, or rib retractions  Heart: Regular rate and rhythm, no murmurs or gallops Breast:Examined in sitting and supine position were symmetrical in appearance, no palpable masses or tenderness,  no skin retraction, no nipple inversion, no nipple discharge, no skin discoloration, no axillary or supraclavicular lymphadenopathy Abdomen: no palpable masses or tenderness, no rebound or guarding Extremities: no edema or skin discoloration or tenderness  Pelvic: Vulva: Normal             Vagina: No gross lesions or discharge  Cervix: No gross lesions or discharge.  Pap reflex done.  Uterus  AV, normal size, shape and consistency, non-tender and mobile  Adnexa  Without masses or tenderness  Anus: Normal   Assessment/Plan:  59 y.o. female for annual exam   1. Encounter for gynecological examination with Papanicolaou smear of cervix Normal gynecologic exam in menopause.  Pap reflex done.  Breast exam normal.  Screening mammogram November 2020 was negative.  Colonoscopy in 2017.  Health labs with  family physician.  2. Postmenopausal Well on no hormone replacement therapy.  No postmenopausal bleeding.  Vitamin D supplements, calcium intake of 1200 mg daily and regular weightbearing physical activity is recommended.  3. Class 3 severe obesity due to excess calories without serious comorbidity with body mass index (BMI) of 40.0 to 44.9 in adult [facility]) Recommend a lower calorie/carb diet such as [employer].  Aerobic physical activities 5 times a week and light weightlifting every 2 days.  Other orders -  Melatonin 10 MG CAPS; Take by mouth. - TURMERIC PO; Take by mouth.  Princess Bruins MD, 8:35 AM 02/23/2019

## 2019-02-25 LAB — PAP IG W/ RFLX HPV ASCU

## 2019-02-28 ENCOUNTER — Encounter: Payer: Self-pay | Admitting: Obstetrics & Gynecology

## 2019-02-28 NOTE — Patient Instructions (Signed)
1. Encounter for gynecological examination with Papanicolaou smear of cervix Normal gynecologic exam in menopause.  Pap reflex done.  Breast exam normal.  Screening mammogram November 2020 was negative.  Colonoscopy in 2017.  Health labs with family physician.  2. Postmenopausal Well on no hormone replacement therapy.  No postmenopausal bleeding.  Vitamin D supplements, calcium intake of 1200 mg daily and regular weightbearing physical activity is recommended.  3. Class 3 severe obesity due to excess calories without serious comorbidity with body mass index (BMI) of 40.0 to 44.9 in adult [facility]) Recommend a lower calorie/carb diet such as [employer].  Aerobic physical activities 5 times a week and light weightlifting every 2 days.  Other orders - Melatonin 10 MG CAPS; Take by mouth. - TURMERIC PO; Take by mouth.  Suzanne Schaefer, it was a pleasure seeing you today!  I will inform you of your results as soon as they are available.

## 2019-03-02 ENCOUNTER — Ambulatory Visit: Payer: 59 | Admitting: Internal Medicine

## 2019-03-02 ENCOUNTER — Encounter: Payer: Self-pay | Admitting: Internal Medicine

## 2019-03-02 ENCOUNTER — Other Ambulatory Visit: Payer: Self-pay

## 2019-03-02 DIAGNOSIS — E559 Vitamin D deficiency, unspecified: Secondary | ICD-10-CM | POA: Diagnosis not present

## 2019-03-02 DIAGNOSIS — E538 Deficiency of other specified B group vitamins: Secondary | ICD-10-CM | POA: Diagnosis not present

## 2019-03-02 DIAGNOSIS — G2581 Restless legs syndrome: Secondary | ICD-10-CM

## 2019-03-02 DIAGNOSIS — G8929 Other chronic pain: Secondary | ICD-10-CM | POA: Diagnosis not present

## 2019-03-02 DIAGNOSIS — R519 Headache, unspecified: Secondary | ICD-10-CM | POA: Insufficient documentation

## 2019-03-02 DIAGNOSIS — R7303 Prediabetes: Secondary | ICD-10-CM

## 2019-03-02 DIAGNOSIS — Z0000 Encounter for general adult medical examination without abnormal findings: Secondary | ICD-10-CM

## 2019-03-02 DIAGNOSIS — D509 Iron deficiency anemia, unspecified: Secondary | ICD-10-CM

## 2019-03-02 LAB — VITAMIN B12: Vitamin B-12: 413 pg/mL (ref 211–911)

## 2019-03-02 LAB — URINALYSIS
Bilirubin Urine: NEGATIVE
Hgb urine dipstick: NEGATIVE
Ketones, ur: NEGATIVE
Leukocytes,Ua: NEGATIVE
Nitrite: NEGATIVE
Specific Gravity, Urine: 1.02 (ref 1.000–1.030)
Total Protein, Urine: NEGATIVE
Urine Glucose: NEGATIVE
Urobilinogen, UA: 0.2 (ref 0.0–1.0)
pH: 5.5 (ref 5.0–8.0)

## 2019-03-02 LAB — LIPID PANEL
Cholesterol: 223 mg/dL — ABNORMAL HIGH (ref 0–200)
HDL: 103.4 mg/dL (ref >39.00)
LDL Cholesterol: 103 mg/dL — ABNORMAL HIGH (ref 0–99)
NonHDL: 119.66
Total CHOL/HDL Ratio: 2
Triglycerides: 82 mg/dL (ref 0.0–149.0)
VLDL: 16.4 mg/dL (ref 0.0–40.0)

## 2019-03-02 LAB — CBC WITH DIFFERENTIAL/PLATELET
Basophils Absolute: 0 10*3/uL (ref 0.0–0.1)
Basophils Relative: 0.7 % (ref 0.0–3.0)
Eosinophils Absolute: 0.2 10*3/uL (ref 0.0–0.7)
Eosinophils Relative: 2.7 % (ref 0.0–5.0)
HCT: 37 % (ref 36.0–46.0)
Hemoglobin: 12.3 g/dL (ref 12.0–15.0)
Lymphocytes Relative: 33.1 % (ref 12.0–46.0)
Lymphs Abs: 1.9 10*3/uL (ref 0.7–4.0)
MCHC: 33.2 g/dL (ref 30.0–36.0)
MCV: 87 fl (ref 78.0–100.0)
Monocytes Absolute: 0.4 10*3/uL (ref 0.1–1.0)
Monocytes Relative: 6.9 % (ref 3.0–12.0)
Neutro Abs: 3.2 10*3/uL (ref 1.4–7.7)
Neutrophils Relative %: 56.6 % (ref 43.0–77.0)
Platelets: 247 10*3/uL (ref 150.0–400.0)
RBC: 4.25 Mil/uL (ref 3.87–5.11)
RDW: 14.1 % (ref 11.5–15.5)
WBC: 5.7 10*3/uL (ref 4.0–10.5)

## 2019-03-02 LAB — BASIC METABOLIC PANEL
BUN: 24 mg/dL — ABNORMAL HIGH (ref 6–23)
CO2: 28 mEq/L (ref 19–32)
Calcium: 9.3 mg/dL (ref 8.4–10.5)
Chloride: 103 mEq/L (ref 96–112)
Creatinine, Ser: 0.65 mg/dL (ref 0.40–1.20)
GFR: 93.66 mL/min (ref >60.00)
Glucose, Bld: 94 mg/dL (ref 70–99)
Potassium: 3.9 mEq/L (ref 3.5–5.1)
Sodium: 138 mEq/L (ref 135–145)

## 2019-03-02 LAB — HEPATIC FUNCTION PANEL
ALT: 14 U/L (ref 0–35)
AST: 44 U/L — ABNORMAL HIGH (ref 0–37)
Albumin: 4.2 g/dL (ref 3.5–5.2)
Alkaline Phosphatase: 134 U/L — ABNORMAL HIGH (ref 39–117)
Bilirubin, Direct: 0 mg/dL (ref 0.0–0.3)
Total Bilirubin: 0.3 mg/dL (ref 0.2–1.2)
Total Protein: 7.1 g/dL (ref 6.0–8.3)

## 2019-03-02 LAB — TSH: TSH: 0.88 u[IU]/mL (ref 0.35–4.50)

## 2019-03-02 LAB — SEDIMENTATION RATE: Sed Rate: 14 mm/hr (ref 0–30)

## 2019-03-02 LAB — VITAMIN D 25 HYDROXY (VIT D DEFICIENCY, FRACTURES): VITD: 52.3 ng/mL (ref 30.00–100.00)

## 2019-03-02 MED ORDER — ROPINIROLE HCL 0.25 MG PO TABS: 0.7500 mg | ORAL_TABLET | Freq: Every day | ORAL | 3 refills | Status: DC

## 2019-03-02 MED ORDER — PANTOPRAZOLE SODIUM 40 MG PO TBEC: 40.0000 mg | DELAYED_RELEASE_TABLET | Freq: Every day | ORAL | 3 refills | Status: DC

## 2019-03-02 MED FILL — rOPINIRole HCL 0.25 MG TABS: 0.25 | 90 days supply | Qty: 270 | Fill #0

## 2019-03-02 MED FILL — PANTOPRAZOLE SOD DR 40 MG T: 40 | 90 days supply | Qty: 90 | Fill #0

## 2019-03-02 NOTE — Assessment & Plan Note (Signed)
Vit D 

## 2019-03-02 NOTE — Assessment & Plan Note (Signed)
B 12

## 2019-03-02 NOTE — Assessment & Plan Note (Signed)
We discussed age appropriate health related issues, including available/recomended screening tests and vaccinations. We discussed a need for adhering to healthy diet and exercise. Labs were ordered to be later reviewed . All questions were answered.   

## 2019-03-02 NOTE — Progress Notes (Signed)
Subjective:  Patient ID: Suzanne Schaefer, female    DOB: September 21, 1960  Age: 59 y.o. MRN: [id number]  CC: No chief complaint on file.   HPI KATHALYN DINALLO presents for a well exam C/o daily HA every am; not using CPAP COVID (+) Dec 2020 F/u RLS C/o L foot pain - burning   Outpatient Medications Prior to Visit  Medication Sig Dispense Refill  . Aspirin-Caffeine (BAYER BACK & BODY) 500-32.5 MG TABS Take by mouth.    . B Complex-Folic Acid (B COMPLEX-VITAMIN B12 PO) Take 5,000 mcg by mouth once a week.    Marland Kitchen BIOTIN PO Take by mouth.    . Calcium-Vitamin D-Vitamin K ([employer]S CALCIUM SOFT CHEWS PO) Take 1,200 mg by mouth daily.    . Cetirizine-Pseudoephedrine (ZYRTEC-D PO) Take 1 tablet by mouth every morning.     . Cholecalciferol (VITAMIN D-3) 1000 UNITS CAPS Take 2 capsules by mouth 3 (three) times a week.     . fluticasone (FLONASE) 50 MCG/ACT nasal spray Place 2 sprays into both nostrils at bedtime.    Marland Kitchen guaiFENesin (MUCINEX) 600 MG 12 hr tablet Take 600 mg by mouth 2 (two) times daily.    . Homeopathic Products (CVS LEG CRAMPS PAIN RELIEF PO) Take by mouth daily as needed.    . Ibuprofen (ADVIL) 200 MG CAPS Take by mouth every 6 (six) hours as needed.    . magnesium oxide (MAG-OX) 400 MG tablet Take 400 mg by mouth 3 (three) times a week. Mon,Wed,Fri    . Melatonin 10 MG CAPS Take by mouth.    . Misc Natural Products (HEALTHY LIVER PO) Take by mouth.    . Misc Natural Products (HM JOINT HEALTH ULTRA PO) Take by mouth.    . Multiple Minerals-Vitamins (CVS CALCIUM 600 PLUS) CHEW Chew 1 each by mouth daily.    . Multiple Vitamins-Minerals (CENTRUM VITAMINTS PO) Take 1 each by mouth 2 (two) times daily.     . polyethylene glycol (MIRALAX / GLYCOLAX) packet Take 17 g by mouth at bedtime.    . TURMERIC PO Take by mouth.    . pantoprazole (PROTONIX) 40 MG tablet Take 1 tablet (40 mg total) by mouth daily. 90 tablet 3  . rOPINIRole (REQUIP) 0.25 MG tablet Take 3 tablets (0.75 mg total) by mouth  at bedtime. 270 tablet 3  . azithromycin (ZITHROMAX) 250 MG tablet 2 today then one daily (Patient not taking: Reported on 03/02/2019) 6 tablet 0   Facility-Administered Medications Prior to Visit  Medication Dose Route Frequency Provider Last Rate Last Admin  . 0.9 %  sodium chloride infusion  500 mL Intravenous Continuous Gatha Mayer, MD        ROS: Review of Systems  Constitutional: Negative for activity change, appetite change, chills, fatigue and unexpected weight change.  HENT: Negative for congestion, mouth sores and sinus pressure.   Eyes: Negative for visual disturbance.  Respiratory: Negative for cough and chest tightness.   Gastrointestinal: Negative for abdominal pain and nausea.  Genitourinary: Negative for difficulty urinating, frequency and vaginal pain.  Musculoskeletal: Negative for back pain and gait problem.  Skin: Negative for pallor and rash.  Neurological: Negative for dizziness, tremors, weakness, numbness and headaches.  Psychiatric/Behavioral: Negative for confusion and sleep disturbance.    Objective:  BP 122/80 (BP Location: Left Arm, Patient Position: Sitting, Cuff Size: Large)   Pulse (!) 58   Temp 98 F (36.7 C) (Oral)   Ht 5\' 2"  (1.575 m)  Wt 244 lb (110.7 kg)   SpO2 99%   BMI 44.63 kg/m   BP Readings from Last 3 Encounters:  03/02/19 122/80  02/23/19 124/78  03/11/18 110/80    Wt Readings from Last 3 Encounters:  03/02/19 244 lb (110.7 kg)  02/23/19 241 lb (109.3 kg)  02/24/18 205 lb (93 kg)    Physical Exam Constitutional:      General: She is not in acute distress.    Appearance: She is well-developed. She is obese.  HENT:     Head: Normocephalic.     Right Ear: External ear normal.     Left Ear: External ear normal.     Nose: Nose normal.  Eyes:     General:        Right eye: No discharge.        Left eye: No discharge.     Conjunctiva/sclera: Conjunctivae normal.     Pupils: Pupils are equal, round, and reactive to  light.  Neck:     Thyroid: No thyromegaly.     Vascular: No JVD.     Trachea: No tracheal deviation.  Cardiovascular:     Rate and Rhythm: Normal rate and regular rhythm.     Heart sounds: Normal heart sounds.  Pulmonary:     Effort: No respiratory distress.     Breath sounds: No stridor. No wheezing.  Abdominal:     General: Bowel sounds are normal. There is no distension.     Palpations: Abdomen is soft. There is no mass.     Tenderness: There is no abdominal tenderness. There is no guarding or rebound.  Musculoskeletal:        General: No tenderness.     Cervical back: Normal range of motion and neck supple.  Lymphadenopathy:     Cervical: No cervical adenopathy.  Skin:    Findings: No erythema or rash.  Neurological:     Mental Status: She is oriented to person, place, and time.     Cranial Nerves: No cranial nerve deficit.     Motor: No abnormal muscle tone.     Coordination: Coordination normal.     Deep Tendon Reflexes: Reflexes normal.  Psychiatric:        Behavior: Behavior normal.        Thought Content: Thought content normal.        Judgment: Judgment normal.   L foot NT, no rash, no sensory deficit  Lab Results  Component Value Date   WBC 7.4 02/26/2018   HGB 13.2 02/26/2018   HCT 38.8 02/26/2018   PLT 245.0 02/26/2018   GLUCOSE 95 02/26/2018   CHOL 228 (H) 02/26/2018   TRIG 58.0 02/26/2018   HDL 108.40 02/26/2018   LDLDIRECT 115.2 01/27/2013   LDLCALC 108 (H) 02/26/2018   ALT 22 02/26/2018   AST 59 (H) 02/26/2018   NA 140 02/26/2018   K 3.8 02/26/2018   CL 103 02/26/2018   CREATININE 0.77 02/26/2018   BUN 20 02/26/2018   CO2 28 02/26/2018   TSH 0.65 02/26/2018   INR 1.12 02/24/2015   HGBA1C 5.5 02/26/2018    DG Bone Density  Result Date: 03/20/2017 Date of study: 03/18/2017 Exam: DUAL X-RAY ABSORPTIOMETRY (DXA) FOR BONE MINERAL DENSITY (BMD) Instrument: [employer] Requesting Provider: Dr. Dellis Filbert Indication: screening for  osteoporosis Comparison: 02/05/2013 Clinical data: Pt is a 59 y.o. female with previous foot fracture. On Calcium and vitamin D. Results:  Lumbar spine L1-L4 Femoral neck (FN) T-score -0.1  RFN: -0.2 LFN: +0.1 Change in BMD from previous DXA test (%) -12.7%* -12.3%* (*) statistically significant Assessment: the BMD is normal according to the [facility] classification for osteoporosis (see below). Fracture risk: low FRAX score: not calculated due to normal BMD Comments: the technical quality of the study is good Recommend optimizing calcium (1200 mg/day) and vitamin D (800 IU/day) intake. No pharmacological treatment is indicated. Followup: Repeat BMD is appropriate after 2 years. WHO criteria for diagnosis of osteoporosis in postmenopausal women and in men 83 y/o or older: - normal: T-score -1.0 to + 1.0 - osteopenia/low bone density: T-score between -2.5 and -1.0 - osteoporosis: T-score below -2.5 - severe osteoporosis: T-score below -2.5 with history of fragility fracture Note: although not part of the WHO classification, the presence of a fragility fracture, regardless of the T-score, should be considered diagnostic of osteoporosis, provided other causes for the fracture have been excluded. Philemon Kingdom, MD [facility]    Assessment & Plan:     Meds ordered this encounter  Medications  . rOPINIRole (REQUIP) 0.25 MG tablet    Sig: Take 3 tablets (0.75 mg total) by mouth at bedtime.    Dispense:  270 tablet    Refill:  3  . pantoprazole (PROTONIX) 40 MG tablet    Sig: Take 1 tablet (40 mg total) by mouth daily.    Dispense:  90 tablet    Refill:  3     Follow-up: No follow-ups on file.  Algeo Kehr, MD

## 2019-03-02 NOTE — Assessment & Plan Note (Signed)
CT head ?requip side effect

## 2019-03-02 NOTE — Assessment & Plan Note (Signed)
labs

## 2019-03-02 NOTE — Assessment & Plan Note (Signed)
CBC, iron 

## 2019-03-02 NOTE — Assessment & Plan Note (Signed)
Requip  ?

## 2019-03-10 ENCOUNTER — Ambulatory Visit (INDEPENDENT_AMBULATORY_CARE_PROVIDER_SITE_OTHER)
Admission: RE | Admit: 2019-03-10 | Discharge: 2019-03-10 | Disposition: A | Discharge status: Home / Self Care | Payer: 59 | Source: Ambulatory Visit | Attending: Internal Medicine | Admitting: Internal Medicine

## 2019-03-10 ENCOUNTER — Other Ambulatory Visit: Payer: Self-pay

## 2019-03-10 DIAGNOSIS — G8929 Other chronic pain: Secondary | ICD-10-CM | POA: Diagnosis not present

## 2019-03-10 DIAGNOSIS — R519 Headache, unspecified: Secondary | ICD-10-CM

## 2019-05-04 ENCOUNTER — Encounter: Payer: Self-pay | Admitting: Registered"

## 2019-05-04 ENCOUNTER — Encounter: Payer: 59 | Attending: Surgery | Admitting: Registered"

## 2019-05-04 ENCOUNTER — Other Ambulatory Visit: Payer: Self-pay

## 2019-05-04 DIAGNOSIS — E669 Obesity, unspecified: Secondary | ICD-10-CM | POA: Diagnosis not present

## 2019-05-04 NOTE — Patient Instructions (Signed)
-   Try to have vegetables have at least once a day; with dinner.

## 2019-05-04 NOTE — Progress Notes (Signed)
Follow-up visit: 5 Years Post-Operative Sleeve Gastrectomy Surgery  Medical Nutrition Therapy:  Appt start time: 8:04 end time: 8:50  Primary concerns today: Post-operative Bariatric Surgery Nutrition Management.   Pre-surgery Goals: to get healthier and live longer  Expectations: wants to know what she needs to do to keep losing weight. Pt reports goal weight of 140-150 lbs.   Pt states she is over trying to get smaller. Expresses disappointment with weight continuing to increase. States she feels if she had stayed with "proteins only diet" after surgery she would have probably continued to lose weight. Reports the pandemic did not help with her dietary habits and halted her ability to be physically active. States she takes the stairs at work about 4x/day instead of taking the elevator.   Pt states she was working out and then started gaining weight. Reports this created frustration when doing what she's supposed it. Pt states she was told she was fine until she started gaining weight at 59 years old.   Previous visit: Pt states she has increased since lowest point (~168 lbs 2016-2017) and not happy about it. Pt reports history of dieting and dieting mindset which and states that's why she is more focused on calories. Pt states she bought probiotic to help burn fat; got the flu for the first time shortly after taking it. Pt changes the subject when I mention diets, dieting products, and the market for them.   Non scale victories: none stated  Surgery date: 04/05/2014 Surgery type: Sleeve gastrectomy Start weight at [facility]: 269 lbs on 01/08/2014 Weight today: 245.5 lbs Weight change: 13.3 lbs gain from 232.3 (11/03/2018) Total weight lost: 23.5 lbs loss Weight loss goal: 140-150 lbs   Body Composition Scale  Total Body Fat: 47.6 % (increased 1.5% from 6 months ago) 46.2%  Visceral Fat: 20 increased from 18  Fat-Free Mass: 52.3% increased 53.7%   Total Body Water: 40.6% increased from  41.3%  Muscle-Mass: 28.4  increased from 28.5 lbs  Body Fat Displacement:  Torso: 72.4 increased from 66.4 lbs Left Leg: 14.4  increased from 13.2 lbs Right Leg: 14.4  increased from 13.2 lbs Left Arm: 7.2  increased from 6.6 lbs Right Arm: 7.2  increased from 6.6 lbs    TANITA  BODY COMP RESULTS  04/04/2016 10/15/2016 04/15/2017 07/15/2017   BMI (kg/m^2) 30.8 32.6 34.6 35.1   Fat Mass (lbs) 63.6 72.4 79.4 78.2   Fat Free Mass (lbs) 110.4 111.8 110.0 113.6   Total Body Water (lbs) 77.8 79.4 78.4 80.4     Surgery date: 04/05/2014 Surgery type: Gastric sleeve Start weight at [facility]: 269 lbs on 01/08/2014 Weight today: 168.8 lbs Weight change: 1.2 lbs  Total weight loss: 100.2 lbs  TANITA BODY COMP RESULTS  03/14/14 04/19/14 05/31/14 06/27/14 09/27/14 12/12/14 01/03/15 04/05/15 07/11/15  BMI (kg/m^2) 47.7 43.0 40.7 38.8 34.9 33.0 32.7 30.1 29.9  Fat Mass (lbs) 134.5 123.5 98.5 99.5 79.0 63.0 71.0 57.5 56.8  Fat Free Mass (lbs) 135 119.0 131.5 119.5 118.0 123.5 113.5 112.5 112  Total Body Water (lbs) 99 87.0 96.5 87.5 86.5 90.5 83.0 82.5 78.8                  Preferred Learning Style:   No preference indicated   Learning Readiness:   Ready  24-hr recall: B (6 AM): protein shake (30g) + snack bag of popcorn or toast + PB + corn chips Snk (AM): [employer]-bacon and eggs (no biscuit) L (PM): [employer]-lasagna or 3 slices  of pepperoni pizza Snk (PM):   D (PM): ham and cheese sandwich  Snk (PM): Outshine fruit bar  Fluid intake: water; 48+ oz Estimated total protein intake: 60+ g  Medications:  See list  Supplementation: Centrum Complete 2x/day, 2 Ca once a day; B complex liquid once/week, Vitamin D  Using straws: occasionally when driving Drinking while eating: no Hair loss: some breaking Carbonated beverages: no N/V/D/C/GAS: no, no, no, no: taking miralax daily or every other day to keep her regular,  no Dumping syndrome: No Having you been chewing well: yes Chewing/swallowing difficulties:  no Changes in vision: no Changes to mood/headaches:  no Hair loss/Changes to skin/Changes to nails: no, dry/rigid nails Any difficulty focusing or concentrating: no Sweating:  no Dizziness/Lightheaded: no Palpitations: no Abdominal Pain: no  Recent physical activity:   Progress Towards Goal(s):  In progress.   Nutritional Diagnosis:  [place]-3.3 Overweight/obesity related to past poor dietary habits and physical inactivity as evidenced by patient w/ recent sleeve gastrectomy surgery following dietary guidelines for continued weight loss.    Intervention:  Nutrition education and counseling. Discussed meeting protein and fluid needs daily. Discussed importance of having vegetables during the day, ideally with lunch and dinner. Pt was in agreement with goals listed.    Goals: - Try to have vegetables have at least once a day; with dinner.     Teaching Method Utilized:  Visual Auditory Hands on  Barriers to learning/adherence to lifestyle change: contemplative stage of change  Demonstrated degree of understanding via:  Teach Back   Monitoring/Evaluation:  Dietary intake, exercise, and body weight. Follow-up prn.

## 2019-05-28 MED FILL — PANTOPRAZOLE SOD DR 40 MG T: 40 | 90 days supply | Qty: 90 | Fill #1

## 2019-05-28 MED FILL — rOPINIRole HCL 0.25 MG TABS: 0.25 | 90 days supply | Qty: 270 | Fill #1

## 2019-06-02 ENCOUNTER — Ambulatory Visit: Payer: 59 | Admitting: Internal Medicine

## 2019-08-26 MED FILL — PANTOPRAZOLE SOD DR 40 MG T: 40 | 90 days supply | Qty: 90 | Fill #2

## 2019-08-26 MED FILL — rOPINIRole HCL 0.25 MG TABS: 0.25 | 90 days supply | Qty: 270 | Fill #2

## 2019-10-25 DIAGNOSIS — L578 Other skin changes due to chronic exposure to nonionizing radiation: Secondary | ICD-10-CM | POA: Diagnosis not present

## 2019-10-25 DIAGNOSIS — Z86018 Personal history of other benign neoplasm: Secondary | ICD-10-CM | POA: Diagnosis not present

## 2019-10-25 DIAGNOSIS — L814 Other melanin hyperpigmentation: Secondary | ICD-10-CM | POA: Diagnosis not present

## 2019-10-25 DIAGNOSIS — D225 Melanocytic nevi of trunk: Secondary | ICD-10-CM | POA: Diagnosis not present

## 2019-10-25 DIAGNOSIS — L821 Other seborrheic keratosis: Secondary | ICD-10-CM | POA: Diagnosis not present

## 2019-11-24 MED FILL — rOPINIRole HCL 0.25 MG TABS: 0.25 | 90 days supply | Qty: 270 | Fill #3

## 2019-11-24 MED FILL — PANTOPRAZOLE SOD DR 40 MG T: 40 | 90 days supply | Qty: 90 | Fill #3

## 2019-12-07 DIAGNOSIS — L649 Androgenic alopecia, unspecified: Secondary | ICD-10-CM | POA: Diagnosis not present

## 2019-12-09 DIAGNOSIS — Z1231 Encounter for screening mammogram for malignant neoplasm of breast: Secondary | ICD-10-CM | POA: Diagnosis not present

## 2019-12-09 LAB — HM MAMMOGRAPHY

## 2019-12-10 ENCOUNTER — Encounter: Payer: Self-pay | Admitting: Internal Medicine

## 2020-02-24 ENCOUNTER — Other Ambulatory Visit: Payer: Self-pay

## 2020-02-24 ENCOUNTER — Ambulatory Visit (INDEPENDENT_AMBULATORY_CARE_PROVIDER_SITE_OTHER): Payer: 59 | Admitting: Obstetrics & Gynecology

## 2020-02-24 ENCOUNTER — Encounter: Payer: Self-pay | Admitting: Obstetrics & Gynecology

## 2020-02-24 VITALS — BP 126/80 | Ht 62.0 in | Wt 240.0 lb

## 2020-02-24 DIAGNOSIS — Z780 Asymptomatic menopausal state: Secondary | ICD-10-CM | POA: Diagnosis not present

## 2020-02-24 DIAGNOSIS — Z6841 Body Mass Index (BMI) 40.0 and over, adult: Secondary | ICD-10-CM

## 2020-02-24 DIAGNOSIS — Z01419 Encounter for gynecological examination (general) (routine) without abnormal findings: Secondary | ICD-10-CM

## 2020-02-24 NOTE — Progress Notes (Signed)
Suzanne Schaefer 01-26-1961 [id number]   History:    60 y.o. G2P2L2 Divorced.2 children in their 92s.  CL:[id number] presenting for annual gyn exam   FMB:[id number] x 2013. H/O Vulvar warts back then, no recurrence in the past several years. Postmenopause, well on no hormone replacement therapy. No postmenopausal bleeding. No pelvic pain. Urine and bowel movements normal. Breast normal. Body mass index mildly improved to 43.9.  Had Bariatric surgery in 2016.  Will see Nutritionist. Health labs with family physician.  [place] 12/2015.  Bone density normal in 03/2017.  Past medical history,surgical history, family history and social history were all reviewed and documented in the EPIC chart.  Gynecologic History Patient's last menstrual period was 06/29/2012.  Obstetric History OB History  Gravida Para Term Preterm AB Living  2 2       2   SAB IAB Ectopic Multiple Live Births               # Outcome Date GA Lbr Len/2nd Weight Sex Delivery Anes PTL Lv  2 Para           1 Para              ROS: A ROS was performed and pertinent positives and negatives are included in the history.  GENERAL: No fevers or chills. HEENT: No change in vision, no earache, sore throat or sinus congestion. NECK: No pain or stiffness. CARDIOVASCULAR: No chest pain or pressure. No palpitations. PULMONARY: No shortness of breath, cough or wheeze. GASTROINTESTINAL: No abdominal pain, nausea, vomiting or diarrhea, melena or bright red blood per rectum. GENITOURINARY: No urinary frequency, urgency, hesitancy or dysuria. MUSCULOSKELETAL: No joint or muscle pain, no back pain, no recent trauma. DERMATOLOGIC: No rash, no itching, no lesions. ENDOCRINE: No polyuria, polydipsia, no heat or cold intolerance. No recent change in weight. HEMATOLOGICAL: No anemia or easy bruising or bleeding. NEUROLOGIC: No headache, seizures, numbness, tingling or weakness. PSYCHIATRIC: No depression, no loss of interest in  normal activity or change in sleep pattern.     Exam:   BP 126/80   Ht 5\' 2"  (1.575 m)   Wt 240 lb (108.9 kg)   LMP 06/29/2012   BMI 43.90 kg/m   Body mass index is 43.9 kg/m.  General appearance : Well developed well nourished female. No acute distress HEENT: Eyes: no retinal hemorrhage or exudates,  Neck supple, trachea midline, no carotid bruits, no thyroidmegaly Lungs: Clear to auscultation, no rhonchi or wheezes, or rib retractions  Heart: Regular rate and rhythm, no murmurs or gallops Breast:Examined in sitting and supine position were symmetrical in appearance, no palpable masses or tenderness,  no skin retraction, no nipple inversion, no nipple discharge, no skin discoloration, no axillary or supraclavicular lymphadenopathy Abdomen: no palpable masses or tenderness, no rebound or guarding Extremities: no edema or skin discoloration or tenderness  Pelvic: Vulva: Normal             Vagina: No gross lesions or discharge  Cervix: No gross lesions or discharge.  Pap reflex done.  Uterus  AV, normal size, shape and consistency, non-tender and mobile  Adnexa  Without masses or tenderness  Anus: Normal   Assessment/Plan:  60 y.o. female for annual exam   1. Encounter for routine gynecological examination with Papanicolaou smear of cervix Normal gynecologic exam in menopause. Pap reflex done. Breast exam normal. Screening mammogram November 2021 was negative. Colonoscopy 2017. Health labs with family physician. - Pap IG w/ reflex to HPV when  ASC-U  2. Postmenopausal Well on no hormone replacement therapy. No postmenopausal bleeding. Bone density normal in February 2019, will repeat at 5 years.  3. Class 3 severe obesity due to excess calories without serious comorbidity with body mass index (BMI) of 40.0 to 44.9 in adult [facility]) Recommend a lower calorie/carb diet with intermittent fasting. Aerobic activities 5 times a week and light weightlifting every 2 days.  Princess Bruins MD, 8:31 AM 02/24/2020

## 2020-02-25 LAB — PAP IG W/ RFLX HPV ASCU

## 2020-03-02 ENCOUNTER — Other Ambulatory Visit: Payer: Self-pay

## 2020-03-02 ENCOUNTER — Ambulatory Visit: Payer: 59 | Admitting: Internal Medicine

## 2020-03-02 ENCOUNTER — Other Ambulatory Visit: Payer: Self-pay | Admitting: Internal Medicine

## 2020-03-02 ENCOUNTER — Encounter: Payer: Self-pay | Admitting: Internal Medicine

## 2020-03-02 VITALS — BP 130/82 | HR 66 | Temp 98.1°F | Ht 62.0 in | Wt 240.2 lb

## 2020-03-02 DIAGNOSIS — F3289 Other specified depressive episodes: Secondary | ICD-10-CM | POA: Diagnosis not present

## 2020-03-02 DIAGNOSIS — J302 Other seasonal allergic rhinitis: Secondary | ICD-10-CM

## 2020-03-02 DIAGNOSIS — J3089 Other allergic rhinitis: Secondary | ICD-10-CM | POA: Diagnosis not present

## 2020-03-02 DIAGNOSIS — Z91018 Allergy to other foods: Secondary | ICD-10-CM

## 2020-03-02 DIAGNOSIS — E538 Deficiency of other specified B group vitamins: Secondary | ICD-10-CM

## 2020-03-02 DIAGNOSIS — Z0000 Encounter for general adult medical examination without abnormal findings: Secondary | ICD-10-CM | POA: Diagnosis not present

## 2020-03-02 DIAGNOSIS — Z7689 Persons encountering health services in other specified circumstances: Secondary | ICD-10-CM | POA: Diagnosis not present

## 2020-03-02 DIAGNOSIS — E559 Vitamin D deficiency, unspecified: Secondary | ICD-10-CM | POA: Diagnosis not present

## 2020-03-02 LAB — COMPREHENSIVE METABOLIC PANEL
ALT: 13 U/L (ref 0–35)
AST: 41 U/L — ABNORMAL HIGH (ref 0–37)
Albumin: 4.2 g/dL (ref 3.5–5.2)
Alkaline Phosphatase: 128 U/L — ABNORMAL HIGH (ref 39–117)
BUN: 19 mg/dL (ref 6–23)
CO2: 30 mEq/L (ref 19–32)
Calcium: 9.6 mg/dL (ref 8.4–10.5)
Chloride: 103 mEq/L (ref 96–112)
Creatinine, Ser: 0.76 mg/dL (ref 0.40–1.20)
GFR: 86.09 mL/min (ref >60.00)
Glucose, Bld: 99 mg/dL (ref 70–99)
Potassium: 4.4 mEq/L (ref 3.5–5.1)
Sodium: 138 mEq/L (ref 135–145)
Total Bilirubin: 0.3 mg/dL (ref 0.2–1.2)
Total Protein: 7.4 g/dL (ref 6.0–8.3)

## 2020-03-02 LAB — CBC WITH DIFFERENTIAL/PLATELET
Basophils Absolute: 0 10*3/uL (ref 0.0–0.1)
Basophils Relative: 0.8 % (ref 0.0–3.0)
Eosinophils Absolute: 0.2 10*3/uL (ref 0.0–0.7)
Eosinophils Relative: 3.4 % (ref 0.0–5.0)
HCT: 37.9 % (ref 36.0–46.0)
Hemoglobin: 12.5 g/dL (ref 12.0–15.0)
Lymphocytes Relative: 33.8 % (ref 12.0–46.0)
Lymphs Abs: 1.8 10*3/uL (ref 0.7–4.0)
MCHC: 33.1 g/dL (ref 30.0–36.0)
MCV: 84 fl (ref 78.0–100.0)
Monocytes Absolute: 0.4 10*3/uL (ref 0.1–1.0)
Monocytes Relative: 7.3 % (ref 3.0–12.0)
Neutro Abs: 3 10*3/uL (ref 1.4–7.7)
Neutrophils Relative %: 54.7 % (ref 43.0–77.0)
Platelets: 243 10*3/uL (ref 150.0–400.0)
RBC: 4.51 Mil/uL (ref 3.87–5.11)
RDW: 14.6 % (ref 11.5–15.5)
WBC: 5.5 10*3/uL (ref 4.0–10.5)

## 2020-03-02 LAB — VITAMIN B12: Vitamin B-12: 392 pg/mL (ref 211–911)

## 2020-03-02 LAB — URINALYSIS
Bilirubin Urine: NEGATIVE
Hgb urine dipstick: NEGATIVE
Ketones, ur: NEGATIVE
Leukocytes,Ua: NEGATIVE
Nitrite: NEGATIVE
Specific Gravity, Urine: 1.01 (ref 1.000–1.030)
Total Protein, Urine: NEGATIVE
Urine Glucose: NEGATIVE
Urobilinogen, UA: 0.2 (ref 0.0–1.0)
pH: 5.5 (ref 5.0–8.0)

## 2020-03-02 LAB — TSH: TSH: 1.1 u[IU]/mL (ref 0.35–4.50)

## 2020-03-02 LAB — LIPID PANEL
Cholesterol: 217 mg/dL — ABNORMAL HIGH (ref 0–200)
HDL: 90.4 mg/dL (ref >39.00)
LDL Cholesterol: 106 mg/dL — ABNORMAL HIGH (ref 0–99)
NonHDL: 126.23
Total CHOL/HDL Ratio: 2
Triglycerides: 103 mg/dL (ref 0.0–149.0)
VLDL: 20.6 mg/dL (ref 0.0–40.0)

## 2020-03-02 LAB — VITAMIN D 25 HYDROXY (VIT D DEFICIENCY, FRACTURES): VITD: 52.99 ng/mL (ref 30.00–100.00)

## 2020-03-02 MED ORDER — ROPINIROLE HCL 0.25 MG PO TABS: 0.7500 mg | ORAL_TABLET | Freq: Every day | ORAL | 3 refills | Status: DC

## 2020-03-02 MED ORDER — PANTOPRAZOLE SODIUM 40 MG PO TBEC: 40.0000 mg | DELAYED_RELEASE_TABLET | Freq: Every day | ORAL | 3 refills | Status: DC

## 2020-03-02 MED ORDER — VALACYCLOVIR HCL 500 MG PO TABS: 500.0000 mg | ORAL_TABLET | Freq: Two times a day (BID) | ORAL | 1 refills | Status: DC

## 2020-03-02 NOTE — Progress Notes (Signed)
Subjective:  Patient ID: Suzanne Schaefer, female    DOB: 22-Oct-1960  Age: 60 y.o. MRN: [id number]  CC: Annual Exam   HPI Suzanne Schaefer presents for a well exam C/o mouth soreness ?food related - ?cheeses vs other - 2-3/week  Outpatient Medications Prior to Visit  Medication Sig Dispense Refill  . acetaminophen (TYLENOL) 500 MG tablet Take 500 mg by mouth every 6 (six) hours as needed.    . Aspirin-Caffeine (BAYER BACK & BODY) 500-32.5 MG TABS Take by mouth. Take 1 tablet by mouth every day    . B Complex-Folic Acid (B COMPLEX-VITAMIN B12 PO) Take 5,000 mcg by mouth once a week.    Marland Kitchen BIOTIN PO Take by mouth.    . Calcium-Vitamin D-Vitamin K ([employer] CALCIUM SOFT CHEWS PO) Take 1,200 mg by mouth daily.    . Cetirizine-Pseudoephedrine (ZYRTEC-D PO) Take 1 tablet by mouth every morning.     . Cholecalciferol (VITAMIN D-3) 1000 UNITS CAPS Take 2 capsules by mouth 3 (three) times a week.     . Collagen-Boron-Hyaluronic Acid (MOVE FREE ULTRA JOINT HEALTH) 40-5-3.3 MG TABS Take by mouth. Take 1 tablet by mouth every day    . fluticasone (FLONASE) 50 MCG/ACT nasal spray Place 2 sprays into both nostrils at bedtime.    Marland Kitchen guaiFENesin (MUCINEX) 600 MG 12 hr tablet Take 600 mg by mouth 2 (two) times daily.    . Ibuprofen 200 MG CAPS Take by mouth every 6 (six) hours as needed.    . Melatonin 10 MG CAPS Take by mouth.    . Misc Natural Products (HM JOINT HEALTH ULTRA PO) Take by mouth.    . Multiple Minerals-Vitamins ([employer]S CALCIUM 600 PLUS) CHEW Chew 1 each by mouth daily.    . Multiple Vitamins-Minerals (CENTRUM VITAMINTS PO) Take 1 each by mouth 2 (two) times daily.     . pantoprazole (PROTONIX) 40 MG tablet Take 1 tablet (40 mg total) by mouth daily. 90 tablet 3  . polyethylene glycol (MIRALAX / GLYCOLAX) packet Take 17 g by mouth at bedtime.    Marland Kitchen rOPINIRole (REQUIP) 0.25 MG tablet Take 3 tablets (0.75 mg total) by mouth at bedtime. 270 tablet 3  . TURMERIC PO Take by mouth.    . Aspirin-Caffeine  500-32.5 MG TABS Take by mouth.    Marland Kitchen 0.9 %  sodium chloride infusion      No facility-administered medications prior to visit.    ROS: Review of Systems  Constitutional: Negative for activity change, appetite change, chills, fatigue and unexpected weight change.  HENT: Negative for congestion, mouth sores and sinus pressure.   Eyes: Negative for visual disturbance.  Respiratory: Negative for cough, chest tightness and shortness of breath.   Gastrointestinal: Negative for abdominal pain and nausea.  Genitourinary: Negative for difficulty urinating, frequency and vaginal pain.  Musculoskeletal: Negative for back pain and gait problem.  Skin: Negative for pallor and rash.  Neurological: Negative for dizziness, tremors, weakness, numbness and headaches.  Psychiatric/Behavioral: Negative for confusion and sleep disturbance.    Objective:  BP 130/82 (BP Location: Left Arm)   Pulse 66   Temp 98.1 F (36.7 C) (Oral)   Ht 5\' 2"  (1.575 m)   Wt 240 lb 3.2 oz (109 kg)   LMP 06/29/2012   SpO2 99%   BMI 43.93 kg/m   BP Readings from Last 3 Encounters:  03/02/20 130/82  02/24/20 126/80  03/02/19 122/80    Wt Readings from Last 3 Encounters:  03/02/20  240 lb 3.2 oz (109 kg)  02/24/20 240 lb (108.9 kg)  03/02/19 244 lb (110.7 kg)    Physical Exam Constitutional:      General: She is not in acute distress.    Appearance: She is well-developed. She is obese.  HENT:     Head: Normocephalic.     Right Ear: External ear normal.     Left Ear: External ear normal.     Nose: Nose normal.     Mouth/Throat:     Mouth: Oropharynx is clear and moist.  Eyes:     General:        Right eye: No discharge.        Left eye: No discharge.     Conjunctiva/sclera: Conjunctivae normal.     Pupils: Pupils are equal, round, and reactive to light.  Neck:     Thyroid: No thyromegaly.     Vascular: No JVD.     Trachea: No tracheal deviation.  Cardiovascular:     Rate and Rhythm: Normal rate and  regular rhythm.     Heart sounds: Normal heart sounds.  Pulmonary:     Effort: No respiratory distress.     Breath sounds: No stridor. No wheezing.  Abdominal:     General: Bowel sounds are normal. There is no distension.     Palpations: Abdomen is soft. There is no mass.     Tenderness: There is no abdominal tenderness. There is no guarding or rebound.  Musculoskeletal:        General: No tenderness or edema.     Cervical back: Normal range of motion and neck supple.  Lymphadenopathy:     Cervical: No cervical adenopathy.  Skin:    Findings: No erythema or rash.  Neurological:     Cranial Nerves: No cranial nerve deficit.     Motor: No abnormal muscle tone.     Coordination: Coordination normal.     Deep Tendon Reflexes: Reflexes normal.  Psychiatric:        Mood and Affect: Mood and affect normal.        Behavior: Behavior normal.        Thought Content: Thought content normal.        Judgment: Judgment normal.     Lab Results  Component Value Date   WBC 5.7 03/02/2019   HGB 12.3 03/02/2019   HCT 37.0 03/02/2019   PLT 247.0 03/02/2019   GLUCOSE 94 03/02/2019   CHOL 223 (H) 03/02/2019   TRIG 82.0 03/02/2019   HDL 103.40 03/02/2019   LDLDIRECT 115.2 01/27/2013   LDLCALC 103 (H) 03/02/2019   ALT 14 03/02/2019   AST 44 (H) 03/02/2019   NA 138 03/02/2019   K 3.9 03/02/2019   CL 103 03/02/2019   CREATININE 0.65 03/02/2019   BUN 24 (H) 03/02/2019   CO2 28 03/02/2019   TSH 0.88 03/02/2019   INR 1.12 02/24/2015   HGBA1C 5.5 02/26/2018    CT Head Wo Contrast  Result Date: 03/10/2019 CLINICAL DATA:  Daily headache for 11 months, frontal headaches, sinus drainage EXAM: CT HEAD WITHOUT CONTRAST TECHNIQUE: Contiguous axial images were obtained from the base of the skull through the vertex without intravenous contrast. COMPARISON:  None. FINDINGS: Brain: No acute infarct or hemorrhage. Lateral ventricles and midline structures are unremarkable. No acute extra-axial fluid  collections. No mass effect. Vascular: No hyperdense vessel or unexpected calcification. Skull: Normal. Negative for fracture or focal lesion. Sinuses/Orbits: No acute finding. Other: None IMPRESSION: 1.  No acute intracranial process. Electronically Signed   By: Randa Ngo M.D.   On: 03/10/2019 10:13    Assessment & Plan:    Hudler Kehr, MD

## 2020-03-02 NOTE — Assessment & Plan Note (Signed)
Doing well 

## 2020-03-02 NOTE — Assessment & Plan Note (Signed)
On B12 

## 2020-03-02 NOTE — Assessment & Plan Note (Addendum)
Stomatitis IgE labs ordered Zyrtec Exclusion trial Empiric Valtrex

## 2020-03-02 NOTE — Patient Instructions (Signed)
Use [employer] Peroxicare tooth paste  

## 2020-03-02 NOTE — Assessment & Plan Note (Signed)

## 2020-03-02 NOTE — Assessment & Plan Note (Signed)
On Vit D 

## 2020-03-02 NOTE — Assessment & Plan Note (Signed)
Zyrtec D

## 2020-03-06 LAB — ALLERGEN FOOD PROFILE SPECIFIC IGE
Allergen Apple, IgE: <0.1 kU/L
Allergen Corn, IgE: <0.1 kU/L
Allergen Tomato, IgE: <0.1 kU/L
Chicken IgE: <0.1 kU/L
Codfish IgE: <0.1 kU/L
Egg White IgE: <0.1 kU/L
IgE (Immunoglobulin E), Serum: 3 IU/mL — ABNORMAL LOW (ref 6–495)
Milk IgE: <0.1 kU/L
Orange: <0.1 kU/L
Peanut IgE: <0.1 kU/L
Shrimp IgE: <0.1 kU/L
Soybean IgE: <0.1 kU/L
Tuna: <0.1 kU/L
Wheat IgE: <0.1 kU/L

## 2020-05-30 MED FILL — Ropinirole Hydrochloride Tab 0.25 MG: ORAL | 90 days supply | Qty: 270 | Fill #0 | Status: AC

## 2020-05-30 MED FILL — Pantoprazole Sodium EC Tab 40 MG (Base Equiv): ORAL | 90 days supply | Qty: 90 | Fill #0 | Status: AC

## 2020-05-31 ENCOUNTER — Other Ambulatory Visit: Payer: Self-pay

## 2020-08-28 MED FILL — Ropinirole Hydrochloride Tab 0.25 MG: ORAL | 90 days supply | Qty: 270 | Fill #1 | Status: AC

## 2020-08-28 MED FILL — Pantoprazole Sodium EC Tab 40 MG (Base Equiv): ORAL | 90 days supply | Qty: 90 | Fill #1 | Status: AC

## 2020-08-29 ENCOUNTER — Other Ambulatory Visit: Payer: Self-pay

## 2020-11-28 MED FILL — Pantoprazole Sodium EC Tab 40 MG (Base Equiv): ORAL | 90 days supply | Qty: 90 | Fill #2 | Status: AC

## 2020-11-28 MED FILL — Ropinirole Hydrochloride Tab 0.25 MG: ORAL | 90 days supply | Qty: 270 | Fill #2 | Status: AC

## 2020-11-29 ENCOUNTER — Other Ambulatory Visit: Payer: Self-pay

## 2020-12-12 DIAGNOSIS — Z1231 Encounter for screening mammogram for malignant neoplasm of breast: Secondary | ICD-10-CM | POA: Diagnosis not present

## 2020-12-12 LAB — HM MAMMOGRAPHY

## 2020-12-15 ENCOUNTER — Encounter: Payer: Self-pay | Admitting: Obstetrics & Gynecology

## 2020-12-15 ENCOUNTER — Encounter: Payer: Self-pay | Admitting: Internal Medicine

## 2021-02-27 ENCOUNTER — Ambulatory Visit: Payer: 59 | Admitting: Obstetrics & Gynecology

## 2021-03-01 ENCOUNTER — Ambulatory Visit (INDEPENDENT_AMBULATORY_CARE_PROVIDER_SITE_OTHER): Payer: 59 | Admitting: Obstetrics & Gynecology

## 2021-03-01 ENCOUNTER — Encounter: Payer: Self-pay | Admitting: Obstetrics & Gynecology

## 2021-03-01 ENCOUNTER — Other Ambulatory Visit: Payer: Self-pay

## 2021-03-01 ENCOUNTER — Other Ambulatory Visit (HOSPITAL_COMMUNITY)
Admission: RE | Admit: 2021-03-01 | Discharge: 2021-03-01 | Disposition: A | Discharge status: Home / Self Care | Payer: 59 | Source: Ambulatory Visit | Attending: Obstetrics & Gynecology | Admitting: Obstetrics & Gynecology

## 2021-03-01 VITALS — BP 114/72 | HR 78 | Resp 16 | Ht 61.75 in | Wt 188.0 lb

## 2021-03-01 DIAGNOSIS — Z01419 Encounter for gynecological examination (general) (routine) without abnormal findings: Secondary | ICD-10-CM

## 2021-03-01 DIAGNOSIS — Z6834 Body mass index (BMI) 34.0-34.9, adult: Secondary | ICD-10-CM

## 2021-03-01 DIAGNOSIS — Z780 Asymptomatic menopausal state: Secondary | ICD-10-CM

## 2021-03-01 DIAGNOSIS — E6609 Other obesity due to excess calories: Secondary | ICD-10-CM

## 2021-03-01 NOTE — Progress Notes (Signed)
VASHON ARCH Sep 04, 1960 [id number]   History:    61 y.o. G2P2L2 Divorced.  2 children in their 49s.   RP:  Established patient presenting for annual gyn exam    HPI: Abstinent x 2013.  H/O Vulvar warts back then, no recurrence in the past several years.  Postmenopause, well on no hormone replacement therapy.  No postmenopausal bleeding.  No pelvic pain. Pap Neg 02/2020. Urine and bowel movements normal.  Breast normal. Mammo Neg 12/2020.  Body mass index mild improved to 34.66.  Had Bariatric surgery in 2016.  Seeing Nutritionist.  Health labs with family physician. Colono 12/2015.  Bone density normal in 03/2017.   Past medical history,surgical history, family history and social history were all reviewed and documented in the EPIC chart.  Gynecologic History Patient's last menstrual period was 06/29/2012.  Obstetric History OB History  Gravida Para Term Preterm AB Living  2 2       2   SAB IAB Ectopic Multiple Live Births               # Outcome Date GA Lbr Len/2nd Weight Sex Delivery Anes PTL Lv  2 Para           1 Para              ROS: A ROS was performed and pertinent positives and negatives are included in the history.  GENERAL: No fevers or chills. HEENT: No change in vision, no earache, sore throat or sinus congestion. NECK: No pain or stiffness. CARDIOVASCULAR: No chest pain or pressure. No palpitations. PULMONARY: No shortness of breath, cough or wheeze. GASTROINTESTINAL: No abdominal pain, nausea, vomiting or diarrhea, melena or bright red blood per rectum. GENITOURINARY: No urinary frequency, urgency, hesitancy or dysuria. MUSCULOSKELETAL: No joint or muscle pain, no back pain, no recent trauma. DERMATOLOGIC: No rash, no itching, no lesions. ENDOCRINE: No polyuria, polydipsia, no heat or cold intolerance. No recent change in weight. HEMATOLOGICAL: No anemia or easy bruising or bleeding. NEUROLOGIC: No headache, seizures, numbness, tingling or weakness. PSYCHIATRIC: No  depression, no loss of interest in normal activity or change in sleep pattern.     Exam:   BP 114/72    Pulse 78    Resp 16    Ht 5' 1.75" (1.568 m)    Wt 188 lb (85.3 kg)    LMP 06/29/2012    BMI 34.66 kg/m   Body mass index is 34.66 kg/m.  General appearance : Well developed well nourished female. No acute distress HEENT: Eyes: no retinal hemorrhage or exudates,  Neck supple, trachea midline, no carotid bruits, no thyroidmegaly Lungs: Clear to auscultation, no rhonchi or wheezes, or rib retractions  Heart: Regular rate and rhythm, no murmurs or gallops Breast:Examined in sitting and supine position were symmetrical in appearance, no palpable masses or tenderness,  no skin retraction, no nipple inversion, no nipple discharge, no skin discoloration, no axillary or supraclavicular lymphadenopathy Abdomen: no palpable masses or tenderness, no rebound or guarding Extremities: no edema or skin discoloration or tenderness  Pelvic: Vulva: Normal             Vagina: No gross lesions or discharge  Cervix: No gross lesions or discharge  Uterus  AV, normal size, shape and consistency, non-tender and mobile  Adnexa  Without masses or tenderness  Anus: Normal   Assessment/Plan:  61 y.o. female for annual exam   1. Encounter for routine gynecological examination with Papanicolaou smear of cervix Abstinent x  2013.  H/O Vulvar warts back then, no recurrence in the past several years.  Postmenopause, well on no hormone replacement therapy.  No postmenopausal bleeding.  No pelvic pain. Pap Neg 02/2020. Urine and bowel movements normal.  Breast normal. Mammo Neg 12/2020.  Body mass index mild improved to 34.66.  Had Bariatric surgery in 2016.  Seeing Nutritionist.  Health labs with family physician. Colono 12/2015.  Bone density normal in 03/2017. - Cytology - PAP( [facility])  2. Postmenopausal Postmenopause, well on no hormone replacement therapy.  No postmenopausal bleeding.  No pelvic pain.    3. Class 1 obesity due to excess calories without serious comorbidity with body mass index (BMI) of 34.0 to 34.9 in adult Much improved BMI.  Continue low Calorie/Carb diet with intermittent fasting (skipping dinner).  Regular fitness activities.  Other orders - UNABLE TO FIND; Med Name: aloe pill - Naproxen Sodium (ALEVE PO); Take by mouth. Aleve back & muscle   Princess Bruins MD, 4:37 PM 03/01/2021

## 2021-03-05 LAB — CYTOLOGY - PAP: Diagnosis: NEGATIVE

## 2021-03-06 ENCOUNTER — Other Ambulatory Visit: Payer: Self-pay

## 2021-03-06 ENCOUNTER — Encounter: Payer: Self-pay | Admitting: Internal Medicine

## 2021-03-06 ENCOUNTER — Ambulatory Visit (INDEPENDENT_AMBULATORY_CARE_PROVIDER_SITE_OTHER): Payer: 59 | Admitting: Internal Medicine

## 2021-03-06 DIAGNOSIS — E538 Deficiency of other specified B group vitamins: Secondary | ICD-10-CM

## 2021-03-06 DIAGNOSIS — Z0000 Encounter for general adult medical examination without abnormal findings: Secondary | ICD-10-CM

## 2021-03-06 DIAGNOSIS — E559 Vitamin D deficiency, unspecified: Secondary | ICD-10-CM

## 2021-03-06 LAB — CBC WITH DIFFERENTIAL/PLATELET
Basophils Absolute: 0 10*3/uL (ref 0.0–0.1)
Basophils Relative: 0.6 % (ref 0.0–3.0)
Eosinophils Absolute: 0.1 10*3/uL (ref 0.0–0.7)
Eosinophils Relative: 1.7 % (ref 0.0–5.0)
HCT: 36.7 % (ref 36.0–46.0)
Hemoglobin: 12.3 g/dL (ref 12.0–15.0)
Lymphocytes Relative: 31 % (ref 12.0–46.0)
Lymphs Abs: 1.9 10*3/uL (ref 0.7–4.0)
MCHC: 33.6 g/dL (ref 30.0–36.0)
MCV: 85.6 fl (ref 78.0–100.0)
Monocytes Absolute: 0.4 10*3/uL (ref 0.1–1.0)
Monocytes Relative: 6 % (ref 3.0–12.0)
Neutro Abs: 3.7 10*3/uL (ref 1.4–7.7)
Neutrophils Relative %: 60.7 % (ref 43.0–77.0)
Platelets: 204 10*3/uL (ref 150.0–400.0)
RBC: 4.29 Mil/uL (ref 3.87–5.11)
RDW: 14.6 % (ref 11.5–15.5)
WBC: 6 10*3/uL (ref 4.0–10.5)

## 2021-03-06 LAB — URINALYSIS
Bilirubin Urine: NEGATIVE
Hgb urine dipstick: NEGATIVE
Ketones, ur: NEGATIVE
Leukocytes,Ua: NEGATIVE
Nitrite: NEGATIVE
Specific Gravity, Urine: <=1.005 — AB (ref 1.000–1.030)
Total Protein, Urine: NEGATIVE
Urine Glucose: NEGATIVE
Urobilinogen, UA: 0.2 (ref 0.0–1.0)
pH: 6 (ref 5.0–8.0)

## 2021-03-06 LAB — COMPREHENSIVE METABOLIC PANEL
ALT: 11 U/L (ref 0–35)
AST: 41 U/L — ABNORMAL HIGH (ref 0–37)
Albumin: 4.3 g/dL (ref 3.5–5.2)
Alkaline Phosphatase: 112 U/L (ref 39–117)
BUN: 10 mg/dL (ref 6–23)
CO2: 32 mEq/L (ref 19–32)
Calcium: 9.2 mg/dL (ref 8.4–10.5)
Chloride: 102 mEq/L (ref 96–112)
Creatinine, Ser: 0.63 mg/dL (ref 0.40–1.20)
GFR: 96.77 mL/min (ref >60.00)
Glucose, Bld: 89 mg/dL (ref 70–99)
Potassium: 3.3 mEq/L — ABNORMAL LOW (ref 3.5–5.1)
Sodium: 140 mEq/L (ref 135–145)
Total Bilirubin: 0.4 mg/dL (ref 0.2–1.2)
Total Protein: 7.1 g/dL (ref 6.0–8.3)

## 2021-03-06 LAB — TSH: TSH: 1.07 u[IU]/mL (ref 0.35–5.50)

## 2021-03-06 LAB — LIPID PANEL
Cholesterol: 194 mg/dL (ref 0–200)
HDL: 74.3 mg/dL (ref >39.00)
LDL Cholesterol: 101 mg/dL — ABNORMAL HIGH (ref 0–99)
NonHDL: 119.76
Total CHOL/HDL Ratio: 3
Triglycerides: 93 mg/dL (ref 0.0–149.0)
VLDL: 18.6 mg/dL (ref 0.0–40.0)

## 2021-03-06 LAB — VITAMIN D 25 HYDROXY (VIT D DEFICIENCY, FRACTURES): VITD: 66.9 ng/mL (ref 30.00–100.00)

## 2021-03-06 LAB — VITAMIN B12: Vitamin B-12: 389 pg/mL (ref 211–911)

## 2021-03-06 MED ORDER — PANTOPRAZOLE SODIUM 40 MG PO TBEC
DELAYED_RELEASE_TABLET | Freq: Every day | ORAL | 3 refills | Status: DC
  Filled 2021-03-06: qty 90, 90d supply, fill #0
  Filled 2021-05-30: qty 90, 90d supply, fill #1
  Filled 2021-08-30: qty 90, 90d supply, fill #2
  Filled 2021-11-25: qty 90, 90d supply, fill #3

## 2021-03-06 MED ORDER — ROPINIROLE HCL 0.25 MG PO TABS
ORAL_TABLET | ORAL | 3 refills | Status: DC
  Filled 2021-03-06: qty 270, 90d supply, fill #0
  Filled 2021-05-30 – 2021-05-31 (×2): qty 270, 90d supply, fill #1
  Filled 2021-08-30: qty 60, 20d supply, fill #2
  Filled 2021-08-30: qty 210, 70d supply, fill #2
  Filled 2021-11-25: qty 270, 90d supply, fill #3

## 2021-03-06 NOTE — Assessment & Plan Note (Signed)
On B12 

## 2021-03-06 NOTE — Assessment & Plan Note (Addendum)
°  We discussed age appropriate health related issues, including available/recomended screening tests and vaccinations. Labs were ordered to be later reviewed . All questions were answered. We discussed one or more of the following - seat belt use, use of sunscreen/sun exposure exercise, fall risk reduction, second hand smoke exposure, firearm use and storage, seat belt use, a need for adhering to healthy diet and exercise. Labs were ordered.  All questions were answered. PAP w/Dr Dellis Filbert Last colon in 2017 - due in 2027

## 2021-03-06 NOTE — Assessment & Plan Note (Signed)
On Vit D 

## 2021-03-06 NOTE — Patient Instructions (Addendum)
Hoka Bondi  [employer] inserts   The Obesity Code book by Sharman Cheek   These suggestions will probably help you to improve your metabolism if you are not overweight and to lose weight if you are overweight: 1.  Reduce your consumption of sugars and starches.  Eliminate high fructose corn syrup from your diet.  Reduce your consumption of processed foods.  For desserts try to have seasonal fruits, berries, nuts, cheeses or dark chocolate with more than 70% cacao. 2.  Do not snack 3.  You do not have to eat breakfast.  If you choose to have breakfast - eat plain greek yogurt, eggs, oatmeal (without sugar) - use honey if you need to. 4.  Drink water, freshly brewed unsweetened tea (green, black or herbal) or coffee.  Do not drink sodas including diet sodas , juices, beverages sweetened with artificial sweeteners. 5.  Reduce your consumption of refined grains. 6.  Avoid protein drinks such as Optifast, Slim fast etc. Eat chicken, fish, meat, dairy and beans for your sources of protein. 7.  Natural unprocessed fats like cold pressed virgin olive oil, butter, coconut oil are good for you.  Eat avocados. 8.  Increase your consumption of fiber.  Fruits, berries, vegetables, whole grains, flaxseed, chia seeds, beans, popcorn, nuts, oatmeal are good sources of fiber 9.  Use vinegar in your diet, i.e. apple cider vinegar, red wine or balsamic vinegar 10.  You can try fasting.  For example you can skip breakfast and lunch every other day (24-hour fast) 11.  Stress reduction, good night sleep, relaxation, meditation, yoga and other physical activity is likely to help you to maintain low weight too. 12.  If you drink alcohol, limit your alcohol intake to no more than 2 drinks a day.

## 2021-03-06 NOTE — Progress Notes (Signed)
Subjective:  Patient ID: Suzanne Schaefer, female    DOB: December 14, 1960  Age: 60 y.o. MRN: [id number]  CC: Annual Exam   HPI Suzanne Schaefer presents for a well exam C/o heel pain R  Outpatient Medications Prior to Visit  Medication Sig Dispense Refill   acetaminophen (TYLENOL) 500 MG tablet Take 500 mg by mouth every 6 (six) hours as needed.     Aspirin-Caffeine (BAYER BACK & BODY) 500-32.5 MG TABS Take by mouth. Take 1 tablet by mouth every day     Cetirizine-Pseudoephedrine (ZYRTEC-D PO) Take 1 tablet by mouth every morning.      Cholecalciferol (VITAMIN D-3) 1000 UNITS CAPS Take 2 capsules by mouth 3 (three) times a week.      Collagen-Boron-Hyaluronic Acid (MOVE FREE ULTRA JOINT HEALTH) 40-5-3.3 MG TABS Take by mouth. Take 1 tablet by mouth every day     fluticasone (FLONASE) 50 MCG/ACT nasal spray Place 2 sprays into both nostrils at bedtime.     Ibuprofen 200 MG CAPS Take by mouth every 6 (six) hours as needed.     Melatonin 10 MG CAPS Take by mouth.     Multiple Vitamins-Minerals (CENTRUM VITAMINTS PO) Take 1 each by mouth 2 (two) times daily.      Naproxen Sodium (ALEVE PO) Take by mouth. Aleve back & muscle     polyethylene glycol (MIRALAX / GLYCOLAX) packet Take 17 g by mouth at bedtime.     TURMERIC PO Take by mouth.     UNABLE TO FIND Med Name: aloe pill     pantoprazole (PROTONIX) 40 MG tablet TAKE 1 TABLET (40 MG TOTAL) BY MOUTH DAILY. 90 tablet 3   rOPINIRole (REQUIP) 0.25 MG tablet TAKE 3 TABLETS (0.75 MG TOTAL) BY MOUTH AT BEDTIME. 270 tablet 3   No facility-administered medications prior to visit.    ROS: Review of Systems  Constitutional:  Negative for activity change, appetite change, chills, fatigue and unexpected weight change.  HENT:  Negative for congestion, mouth sores and sinus pressure.   Eyes:  Negative for visual disturbance.  Respiratory:  Negative for cough and chest tightness.   Gastrointestinal:  Negative for abdominal pain and nausea.   Genitourinary:  Negative for difficulty urinating, frequency and vaginal pain.  Musculoskeletal:  Negative for back pain and gait problem.  Skin:  Negative for pallor and rash.  Neurological:  Negative for dizziness, tremors, weakness, numbness and headaches.  Psychiatric/Behavioral:  Negative for confusion and sleep disturbance.    Objective:  BP 138/72 (BP Location: Left Arm)    Pulse 64    Temp 98.1 F (36.7 C) (Oral)    Ht 5\' 2"  (1.575 m)    Wt 186 lb 3.2 oz (84.5 kg)    LMP 06/29/2012    SpO2 98%    BMI 34.06 kg/m   BP Readings from Last 3 Encounters:  03/06/21 138/72  03/01/21 114/72  03/02/20 130/82    Wt Readings from Last 3 Encounters:  03/06/21 186 lb 3.2 oz (84.5 kg)  03/01/21 188 lb (85.3 kg)  03/02/20 240 lb 3.2 oz (109 kg)    Physical Exam Constitutional:      General: She is not in acute distress.    Appearance: She is well-developed.  HENT:     Head: Normocephalic.     Right Ear: External ear normal.     Left Ear: External ear normal.     Nose: Nose normal.  Eyes:     General:  Right eye: No discharge.        Left eye: No discharge.     Conjunctiva/sclera: Conjunctivae normal.     Pupils: Pupils are equal, round, and reactive to light.  Neck:     Thyroid: No thyromegaly.     Vascular: No JVD.     Trachea: No tracheal deviation.  Cardiovascular:     Rate and Rhythm: Normal rate and regular rhythm.     Heart sounds: Normal heart sounds.  Pulmonary:     Effort: No respiratory distress.     Breath sounds: No stridor. No wheezing.  Abdominal:     General: Bowel sounds are normal. There is no distension.     Palpations: Abdomen is soft. There is no mass.     Tenderness: There is no abdominal tenderness. There is no guarding or rebound.  Musculoskeletal:        General: No tenderness.     Cervical back: Normal range of motion and neck supple. No rigidity.  Lymphadenopathy:     Cervical: No cervical adenopathy.  Skin:    Findings: No erythema  or rash.  Neurological:     Mental Status: She is oriented to person, place, and time.     Cranial Nerves: No cranial nerve deficit.     Motor: No abnormal muscle tone.     Coordination: Coordination normal.     Deep Tendon Reflexes: Reflexes normal.  Psychiatric:        Behavior: Behavior normal.        Thought Content: Thought content normal.        Judgment: Judgment normal.    Lab Results  Component Value Date   WBC 5.5 03/02/2020   HGB 12.5 03/02/2020   HCT 37.9 03/02/2020   PLT 243.0 03/02/2020   GLUCOSE 99 03/02/2020   CHOL 217 (H) 03/02/2020   TRIG 103.0 03/02/2020   HDL 90.40 03/02/2020   LDLDIRECT 115.2 01/27/2013   LDLCALC 106 (H) 03/02/2020   ALT 13 03/02/2020   AST 41 (H) 03/02/2020   NA 138 03/02/2020   K 4.4 03/02/2020   CL 103 03/02/2020   CREATININE 0.76 03/02/2020   BUN 19 03/02/2020   CO2 30 03/02/2020   TSH 1.10 03/02/2020   INR 1.12 02/24/2015   HGBA1C 5.5 02/26/2018    CT Head Wo Contrast  Result Date: 03/10/2019 CLINICAL DATA:  Daily headache for 11 months, frontal headaches, sinus drainage EXAM: CT HEAD WITHOUT CONTRAST TECHNIQUE: Contiguous axial images were obtained from the base of the skull through the vertex without intravenous contrast. COMPARISON:  None. FINDINGS: Brain: No acute infarct or hemorrhage. Lateral ventricles and midline structures are unremarkable. No acute extra-axial fluid collections. No mass effect. Vascular: No hyperdense vessel or unexpected calcification. Skull: Normal. Negative for fracture or focal lesion. Sinuses/Orbits: No acute finding. Other: None IMPRESSION: 1. No acute intracranial process. Electronically Signed   By: Randa Ngo M.D.   On: 03/10/2019 10:13    Assessment & Plan:   Problem List Items Addressed This Visit     B12 nutritional deficiency    On B12      Relevant Orders   Vitamin B12   Vitamin D deficiency    On Vit D      Relevant Orders   VITAMIN D 25 Hydroxy (Vit-D Deficiency,  Fractures)   Well adult exam     We discussed age appropriate health related issues, including available/recomended screening tests and vaccinations. Labs were ordered to be later  reviewed . All questions were answered. We discussed one or more of the following - seat belt use, use of sunscreen/sun exposure exercise, fall risk reduction, second hand smoke exposure, firearm use and storage, seat belt use, a need for adhering to healthy diet and exercise. Labs were ordered.  All questions were answered. PAP w/Dr Dellis Filbert Last colon in 2017 - due in 2027       Relevant Orders   CBC with Differential/Platelet   Comprehensive metabolic panel   Lipid panel   Urinalysis   TSH   Vitamin B12   VITAMIN D 25 Hydroxy (Vit-D Deficiency, Fractures)      Meds ordered this encounter  Medications   rOPINIRole (REQUIP) 0.25 MG tablet    Sig: TAKE 3 TABLETS (0.75 MG TOTAL) BY MOUTH AT BEDTIME.    Dispense:  270 tablet    Refill:  3   pantoprazole (PROTONIX) 40 MG tablet    Sig: TAKE 1 TABLET (40 MG TOTAL) BY MOUTH DAILY.    Dispense:  90 tablet    Refill:  3      Follow-up: Return in about 1 year (around 03/06/2022) for Wellness Exam.  Wiens Kehr, MD

## 2021-03-07 ENCOUNTER — Other Ambulatory Visit: Payer: Self-pay | Admitting: Internal Medicine

## 2021-03-07 ENCOUNTER — Other Ambulatory Visit: Payer: Self-pay

## 2021-03-07 MED ORDER — POTASSIUM CHLORIDE CRYS ER 10 MEQ PO TBCR
10.0000 meq | EXTENDED_RELEASE_TABLET | Freq: Every day | ORAL | 0 refills | Status: DC
Start: 2021-03-07 — End: 2022-03-27
  Filled 2021-03-07: qty 90, 90d supply, fill #0

## 2021-03-13 DIAGNOSIS — H5201 Hypermetropia, right eye: Secondary | ICD-10-CM | POA: Diagnosis not present

## 2021-05-31 ENCOUNTER — Other Ambulatory Visit: Payer: Self-pay

## 2021-06-01 ENCOUNTER — Other Ambulatory Visit: Payer: Self-pay

## 2021-08-30 ENCOUNTER — Other Ambulatory Visit: Payer: Self-pay

## 2021-11-26 ENCOUNTER — Other Ambulatory Visit: Payer: Self-pay

## 2021-11-29 ENCOUNTER — Encounter: Payer: Self-pay | Admitting: Internal Medicine

## 2021-12-13 DIAGNOSIS — Z1231 Encounter for screening mammogram for malignant neoplasm of breast: Secondary | ICD-10-CM | POA: Diagnosis not present

## 2021-12-13 LAB — HM MAMMOGRAPHY

## 2021-12-17 ENCOUNTER — Encounter: Payer: Self-pay | Admitting: Internal Medicine

## 2022-03-08 ENCOUNTER — Ambulatory Visit: Payer: 59 | Admitting: Obstetrics & Gynecology

## 2022-03-11 ENCOUNTER — Ambulatory Visit: Payer: Commercial Managed Care - PPO | Admitting: Internal Medicine

## 2022-03-11 ENCOUNTER — Encounter: Payer: Self-pay | Admitting: Internal Medicine

## 2022-03-11 ENCOUNTER — Other Ambulatory Visit: Payer: Self-pay

## 2022-03-11 VITALS — BP 120/82 | HR 85 | Temp 98.7°F | Ht 62.0 in | Wt 190.0 lb

## 2022-03-11 DIAGNOSIS — L84 Corns and callosities: Secondary | ICD-10-CM

## 2022-03-11 DIAGNOSIS — E559 Vitamin D deficiency, unspecified: Secondary | ICD-10-CM

## 2022-03-11 DIAGNOSIS — E538 Deficiency of other specified B group vitamins: Secondary | ICD-10-CM

## 2022-03-11 DIAGNOSIS — R252 Cramp and spasm: Secondary | ICD-10-CM

## 2022-03-11 DIAGNOSIS — R7303 Prediabetes: Secondary | ICD-10-CM

## 2022-03-11 DIAGNOSIS — D509 Iron deficiency anemia, unspecified: Secondary | ICD-10-CM | POA: Diagnosis not present

## 2022-03-11 DIAGNOSIS — Z0000 Encounter for general adult medical examination without abnormal findings: Secondary | ICD-10-CM | POA: Diagnosis not present

## 2022-03-11 LAB — CBC WITH DIFFERENTIAL/PLATELET
Basophils Absolute: 0 10*3/uL (ref 0.0–0.1)
Basophils Relative: 0.5 % (ref 0.0–3.0)
Eosinophils Absolute: 0.1 10*3/uL (ref 0.0–0.7)
Eosinophils Relative: 2 % (ref 0.0–5.0)
HCT: 38.5 % (ref 36.0–46.0)
Hemoglobin: 12.9 g/dL (ref 12.0–15.0)
Lymphocytes Relative: 38.5 % (ref 12.0–46.0)
Lymphs Abs: 2.4 10*3/uL (ref 0.7–4.0)
MCHC: 33.6 g/dL (ref 30.0–36.0)
MCV: 86.9 fl (ref 78.0–100.0)
Monocytes Absolute: 0.4 10*3/uL (ref 0.1–1.0)
Monocytes Relative: 6 % (ref 3.0–12.0)
Neutro Abs: 3.4 10*3/uL (ref 1.4–7.7)
Neutrophils Relative %: 53 % (ref 43.0–77.0)
Platelets: 230 10*3/uL (ref 150.0–400.0)
RBC: 4.43 Mil/uL (ref 3.87–5.11)
RDW: 14 % (ref 11.5–15.5)
WBC: 6.3 10*3/uL (ref 4.0–10.5)

## 2022-03-11 LAB — COMPREHENSIVE METABOLIC PANEL
ALT: 12 U/L (ref 0–35)
AST: 44 U/L — ABNORMAL HIGH (ref 0–37)
Albumin: 4.4 g/dL (ref 3.5–5.2)
Alkaline Phosphatase: 110 U/L (ref 39–117)
BUN: 17 mg/dL (ref 6–23)
CO2: 28 mEq/L (ref 19–32)
Calcium: 9.4 mg/dL (ref 8.4–10.5)
Chloride: 103 mEq/L (ref 96–112)
Creatinine, Ser: 0.74 mg/dL (ref 0.40–1.20)
GFR: 87.63 mL/min (ref >60.00)
Glucose, Bld: 90 mg/dL (ref 70–99)
Potassium: 3.8 mEq/L (ref 3.5–5.1)
Sodium: 140 mEq/L (ref 135–145)
Total Bilirubin: 0.4 mg/dL (ref 0.2–1.2)
Total Protein: 7.2 g/dL (ref 6.0–8.3)

## 2022-03-11 LAB — LIPID PANEL
Cholesterol: 256 mg/dL — ABNORMAL HIGH (ref 0–200)
HDL: 108.2 mg/dL (ref >39.00)
LDL Cholesterol: 131 mg/dL — ABNORMAL HIGH (ref 0–99)
NonHDL: 147.4
Total CHOL/HDL Ratio: 2
Triglycerides: 84 mg/dL (ref 0.0–149.0)
VLDL: 16.8 mg/dL (ref 0.0–40.0)

## 2022-03-11 LAB — URINALYSIS
Bilirubin Urine: NEGATIVE
Hgb urine dipstick: NEGATIVE
Ketones, ur: NEGATIVE
Leukocytes,Ua: NEGATIVE
Nitrite: NEGATIVE
Specific Gravity, Urine: <=1.005 — AB (ref 1.000–1.030)
Total Protein, Urine: NEGATIVE
Urine Glucose: NEGATIVE
Urobilinogen, UA: 0.2 (ref 0.0–1.0)
pH: 6 (ref 5.0–8.0)

## 2022-03-11 LAB — VITAMIN B12: Vitamin B-12: 649 pg/mL (ref 211–911)

## 2022-03-11 LAB — VITAMIN D 25 HYDROXY (VIT D DEFICIENCY, FRACTURES): VITD: 48.86 ng/mL (ref 30.00–100.00)

## 2022-03-11 LAB — HEMOGLOBIN A1C: Hgb A1c MFr Bld: 5.5 % (ref 4.6–6.5)

## 2022-03-11 LAB — TSH: TSH: 0.97 u[IU]/mL (ref 0.35–5.50)

## 2022-03-11 MED ORDER — PANTOPRAZOLE SODIUM 40 MG PO TBEC
DELAYED_RELEASE_TABLET | Freq: Every day | ORAL | 3 refills | Status: DC
  Filled 2022-03-11: qty 90, 90d supply, fill #0
  Filled 2022-06-05: qty 90, 90d supply, fill #1
  Filled 2022-09-04: qty 90, 90d supply, fill #2
  Filled 2022-12-04: qty 90, 90d supply, fill #3

## 2022-03-11 MED ORDER — WEGOVY 0.25 MG/0.5ML ~~LOC~~ SOAJ
0.2500 mg | SUBCUTANEOUS | 2 refills | Status: DC
  Filled 2022-03-11 – 2022-03-29 (×2): qty 2, 28d supply, fill #0

## 2022-03-11 MED ORDER — ROPINIROLE HCL 0.25 MG PO TABS
0.7500 mg | ORAL_TABLET | Freq: Every evening | ORAL | 3 refills | Status: DC
  Filled 2022-03-11: qty 270, 90d supply, fill #0
  Filled 2022-06-05: qty 270, 90d supply, fill #1
  Filled 2022-09-04: qty 270, 90d supply, fill #2
  Filled 2022-12-04: qty 270, 90d supply, fill #3

## 2022-03-11 NOTE — Assessment & Plan Note (Signed)
  We discussed age appropriate health related issues, including available/recomended screening tests and vaccinations. Labs were ordered to be later reviewed . All questions were answered. We discussed one or more of the following - seat belt use, use of sunscreen/sun exposure exercise, fall risk reduction, second hand smoke exposure, firearm use and storage, seat belt use, a need for adhering to healthy diet and exercise. Labs were ordered.  All questions were answered. PAP w/Dr Dellis Filbert Last colon in 2017 - due in 2027 Shingrix was suggested

## 2022-03-11 NOTE — Assessment & Plan Note (Signed)
On B complex 

## 2022-03-11 NOTE — Assessment & Plan Note (Signed)
See Paring procedure

## 2022-03-11 NOTE — Patient Instructions (Signed)
Magnesium oil spray

## 2022-03-11 NOTE — Assessment & Plan Note (Signed)
Magnesium oil spray

## 2022-03-11 NOTE — Assessment & Plan Note (Signed)
On vit D 

## 2022-03-11 NOTE — Assessment & Plan Note (Signed)
Monitor CBC 

## 2022-03-11 NOTE — Assessment & Plan Note (Signed)
Start [employer]. BMI 34 Warned re potential side effects - s/p sleeve in 2016

## 2022-03-11 NOTE — Progress Notes (Signed)
Subjective:  Patient ID: Suzanne Schaefer, female    DOB: [birth year]  Age: 62 y.o. MRN: [id number]  CC: Annual Exam   HPI Suzanne Schaefer presents for a well exam C/o foot pain B C/o wt gain  Outpatient Medications Prior to Visit  Medication Sig Dispense Refill   acetaminophen (TYLENOL) 500 MG tablet Take 500 mg by mouth every 6 (six) hours as needed.     Aspirin-Caffeine (BAYER BACK & BODY) 500-32.5 MG TABS Take by mouth. Take 1 tablet by mouth every day     Cetirizine-Pseudoephedrine (ZYRTEC-D PO) Take 1 tablet by mouth every morning.      Cholecalciferol (VITAMIN D-3) 1000 UNITS CAPS Take 2 capsules by mouth 3 (three) times a week.      Collagen-Boron-Hyaluronic Acid (MOVE FREE ULTRA JOINT HEALTH) 40-5-3.3 MG TABS Take by mouth. Take 1 tablet by mouth every day     fluticasone (FLONASE) 50 MCG/ACT nasal spray Place 2 sprays into both nostrils at bedtime.     Ibuprofen 200 MG CAPS Take by mouth every 6 (six) hours as needed.     Melatonin 10 MG CAPS Take by mouth.     Multiple Vitamins-Minerals (CENTRUM VITAMINTS PO) Take 1 each by mouth 2 (two) times daily.      Naproxen Sodium (ALEVE PO) Take by mouth. Aleve back & muscle     polyethylene glycol (MIRALAX / GLYCOLAX) packet Take 17 g by mouth at bedtime.     potassium chloride (KLOR-CON M) 10 MEQ tablet Take 1 tablet (10 mEq total) by mouth daily. 90 tablet 0   TURMERIC PO Take by mouth.     UNABLE TO FIND Med Name: aloe pill     pantoprazole (PROTONIX) 40 MG tablet TAKE 1 TABLET (40 MG TOTAL) BY MOUTH DAILY. 90 tablet 3   rOPINIRole (REQUIP) 0.25 MG tablet TAKE 3 TABLETS (0.75 MG TOTAL) BY MOUTH AT BEDTIME. 270 tablet 3   No facility-administered medications prior to visit.    ROS: Review of Systems  Constitutional:  Positive for unexpected weight change. Negative for activity change, appetite change, chills and fatigue.  HENT:  Negative for congestion, mouth sores and sinus pressure.   Eyes:  Negative for visual disturbance.   Respiratory:  Negative for cough and chest tightness.   Gastrointestinal:  Negative for abdominal pain and nausea.  Genitourinary:  Negative for difficulty urinating, frequency and vaginal pain.  Musculoskeletal:  Negative for back pain and gait problem.  Skin:  Negative for pallor and rash.  Neurological:  Negative for dizziness, tremors, weakness, numbness and headaches.  Psychiatric/Behavioral:  Negative for confusion and sleep disturbance. The patient is not nervous/anxious.     Objective:  BP 120/82 (BP Location: Left Arm, Patient Position: Sitting, Cuff Size: Large)   Pulse 85   Temp 98.7 F (37.1 C) (Oral)   Ht '5\' 2"'$  (1.575 m)   Wt 190 lb (86.2 kg)   LMP 06/29/2012   SpO2 99%   BMI 34.75 kg/m   BP Readings from Last 3 Encounters:  03/11/22 120/82  03/06/21 138/72  03/01/21 114/72    Wt Readings from Last 3 Encounters:  03/11/22 190 lb (86.2 kg)  03/06/21 186 lb 3.2 oz (84.5 kg)  03/01/21 188 lb (85.3 kg)    Physical Exam Constitutional:      General: She is not in acute distress.    Appearance: She is well-developed. She is obese.  HENT:     Head: Normocephalic.  Right Ear: External ear normal.     Left Ear: External ear normal.     Nose: Nose normal.  Eyes:     General:        Right eye: No discharge.        Left eye: No discharge.     Conjunctiva/sclera: Conjunctivae normal.     Pupils: Pupils are equal, round, and reactive to light.  Neck:     Thyroid: No thyromegaly.     Vascular: No JVD.     Trachea: No tracheal deviation.  Cardiovascular:     Rate and Rhythm: Normal rate and regular rhythm.     Heart sounds: Normal heart sounds.  Pulmonary:     Effort: No respiratory distress.     Breath sounds: No stridor. No wheezing.  Abdominal:     General: Bowel sounds are normal. There is no distension.     Palpations: Abdomen is soft. There is no mass.     Tenderness: There is no abdominal tenderness. There is no guarding or rebound.   Musculoskeletal:        General: No tenderness.     Cervical back: Normal range of motion and neck supple. No rigidity.  Lymphadenopathy:     Cervical: No cervical adenopathy.  Skin:    Findings: No erythema or rash.  Neurological:     Mental Status: She is oriented to person, place, and time.     Cranial Nerves: No cranial nerve deficit.     Motor: No abnormal muscle tone.     Coordination: Coordination normal.     Deep Tendon Reflexes: Reflexes normal.  Psychiatric:        Behavior: Behavior normal.        Thought Content: Thought content normal.        Judgment: Judgment normal.   B calluses  Procedure: B   foot callus paring/cutting  Indication:  B  foot callus, painful x6 Consent: verbal  Risks and benefits were explained to the patient. Skin was cleaned with alcohol. I removed a large callus carefully with a round blade. Skin remained intact. Pain is better. Tolerated well. Complications: none. Bandaid applied    Lab Results  Component Value Date   WBC 6.0 03/06/2021   HGB 12.3 03/06/2021   HCT 36.7 03/06/2021   PLT 204.0 03/06/2021   GLUCOSE 89 03/06/2021   CHOL 194 03/06/2021   TRIG 93.0 03/06/2021   HDL 74.30 03/06/2021   LDLDIRECT 115.2 01/27/2013   LDLCALC 101 (H) 03/06/2021   ALT 11 03/06/2021   AST 41 (H) 03/06/2021   NA 140 03/06/2021   K 3.3 (L) 03/06/2021   CL 102 03/06/2021   CREATININE 0.63 03/06/2021   BUN 10 03/06/2021   CO2 32 03/06/2021   TSH 1.07 03/06/2021   INR 1.12 02/24/2015   HGBA1C 5.5 02/26/2018    No results found.  Assessment & Plan:   Problem List Items Addressed This Visit       Musculoskeletal and Integument   Callus of foot    See Paring procedure        Other   ANEMIA, IRON DEFICIENCY    Monitor CBC      B12 nutritional deficiency    On B complex      Relevant Orders   Vitamin B12   Cramps of lower extremity    Magnesium oil spray      Prediabetes    Start Wegovy. BMI 34 Warned re potential side  effects - s/p sleeve in 2016       Vitamin D deficiency    On vit D      Relevant Orders   VITAMIN D 25 Hydroxy (Vit-D Deficiency, Fractures)   Well adult exam - Primary     We discussed age appropriate health related issues, including available/recomended screening tests and vaccinations. Labs were ordered to be later reviewed . All questions were answered. We discussed one or more of the following - seat belt use, use of sunscreen/sun exposure exercise, fall risk reduction, second hand smoke exposure, firearm use and storage, seat belt use, a need for adhering to healthy diet and exercise. Labs were ordered.  All questions were answered. PAP w/Dr Dellis Filbert Last colon in 2017 - due in 2027 Shingrix was suggested      Relevant Orders   TSH   Urinalysis   CBC with Differential/Platelet   Lipid panel   Comprehensive metabolic panel   Vitamin I34   VITAMIN D 25 Hydroxy (Vit-D Deficiency, Fractures)      Meds ordered this encounter  Medications   rOPINIRole (REQUIP) 0.25 MG tablet    Sig: Take 3 tablets (0.75 mg total) by mouth at bedtime.    Dispense:  270 tablet    Refill:  3   pantoprazole (PROTONIX) 40 MG tablet    Sig: TAKE 1 TABLET (40 MG TOTAL) BY MOUTH DAILY.    Dispense:  90 tablet    Refill:  3      Follow-up: Return in about 1 year (around 03/12/2023) for Wellness Exam.  Ciccarelli Kehr, MD

## 2022-03-12 ENCOUNTER — Other Ambulatory Visit: Payer: Self-pay

## 2022-03-15 ENCOUNTER — Telehealth: Payer: Self-pay

## 2022-03-15 NOTE — Telephone Encounter (Signed)
Patient Advocate Encounter  Prior Authorization for [facility] has been approved.    PA# [id number] Effective dates: 03/12/22 through 10/01/22  Approval letter attached to chart

## 2022-03-27 ENCOUNTER — Other Ambulatory Visit (HOSPITAL_COMMUNITY)
Admission: RE | Admit: 2022-03-27 | Discharge: 2022-03-27 | Disposition: A | Discharge status: Home / Self Care | Payer: Commercial Managed Care - PPO | Source: Ambulatory Visit | Attending: Obstetrics & Gynecology | Admitting: Obstetrics & Gynecology

## 2022-03-27 ENCOUNTER — Ambulatory Visit (INDEPENDENT_AMBULATORY_CARE_PROVIDER_SITE_OTHER): Payer: Commercial Managed Care - PPO | Admitting: Obstetrics & Gynecology

## 2022-03-27 ENCOUNTER — Encounter: Payer: Self-pay | Admitting: Obstetrics & Gynecology

## 2022-03-27 VITALS — BP 100/80 | HR 87 | Ht 61.5 in | Wt 198.0 lb

## 2022-03-27 DIAGNOSIS — Z780 Asymptomatic menopausal state: Secondary | ICD-10-CM

## 2022-03-27 DIAGNOSIS — Z01419 Encounter for gynecological examination (general) (routine) without abnormal findings: Secondary | ICD-10-CM | POA: Insufficient documentation

## 2022-03-27 NOTE — Progress Notes (Signed)
Suzanne Schaefer [birth year] [id number]   History:    62 y.o. G2P2L2 Divorced.  2 children in their 49s.   RP:  Established patient presenting for annual gyn exam    HPI: Abstinent x 2013.  H/O Vulvar warts back then, no recurrence in the past several years.  Postmenopause, well on no hormone replacement therapy.  No postmenopausal bleeding.  No pelvic pain. Pap Neg 02/2021.  Pap reflex today. Urine and bowel movements normal.  Breast normal. Mammo Neg 12/2021.  Body mass index increased to 36.81.  Had Bariatric surgery in 2016.  Seeing Nutritionist.  Health labs with family physician. Colono 12/2015. Bone density normal in 03/2017. Flu vaccine done at work.   Past medical history,surgical history, family history and social history were all reviewed and documented in the EPIC chart.  Gynecologic History Patient's last menstrual period was 06/29/2012.  Obstetric History OB History  Gravida Para Term Preterm AB Living  2 2 2     2  $ SAB IAB Ectopic Multiple Live Births               # Outcome Date GA Lbr Len/2nd Weight Sex Delivery Anes PTL Lv  2 Term           1 Term              ROS: A ROS was performed and pertinent positives and negatives are included in the history. GENERAL: No fevers or chills. HEENT: No change in vision, no earache, sore throat or sinus congestion. NECK: No pain or stiffness. CARDIOVASCULAR: No chest pain or pressure. No palpitations. PULMONARY: No shortness of breath, cough or wheeze. GASTROINTESTINAL: No abdominal pain, nausea, vomiting or diarrhea, melena or bright red blood per rectum. GENITOURINARY: No urinary frequency, urgency, hesitancy or dysuria. MUSCULOSKELETAL: No joint or muscle pain, no back pain, no recent trauma. DERMATOLOGIC: No rash, no itching, no lesions. ENDOCRINE: No polyuria, polydipsia, no heat or cold intolerance. No recent change in weight. HEMATOLOGICAL: No anemia or easy bruising or bleeding. NEUROLOGIC: No headache, seizures, numbness,  tingling or weakness. PSYCHIATRIC: No depression, no loss of interest in normal activity or change in sleep pattern.     Exam:   BP 100/80   Pulse 87   Ht 5' 1.5" (1.562 m)   Wt 198 lb (89.8 kg)   LMP 06/29/2012 Comment: not sexually active  SpO2 98%   BMI 36.81 kg/m   Body mass index is 36.81 kg/m.  General appearance : Well developed well nourished female. No acute distress HEENT: Eyes: no retinal hemorrhage or exudates,  Neck supple, trachea midline, no carotid bruits, no thyroidmegaly Lungs: Clear to auscultation, no rhonchi or wheezes, or rib retractions  Heart: Regular rate and rhythm, no murmurs or gallops Breast:Examined in sitting and supine position were symmetrical in appearance, no palpable masses or tenderness,  no skin retraction, no nipple inversion, no nipple discharge, no skin discoloration, no axillary or supraclavicular lymphadenopathy Abdomen: no palpable masses or tenderness, no rebound or guarding Extremities: no edema or skin discoloration or tenderness  Pelvic: Vulva: Normal             Vagina: No gross lesions or discharge  Cervix: No gross lesions or discharge.  Pap reflex done.  Uterus  AV, normal size, shape and consistency, non-tender and mobile  Adnexa  Without masses or tenderness  Anus: Normal   Assessment/Plan:  62 y.o. female for annual exam   1. Encounter for routine gynecological examination with Papanicolaou  smear of cervix Abstinent x 2013.  H/O Vulvar warts back then, no recurrence in the past several years.  Postmenopause, well on no hormone replacement therapy.  No postmenopausal bleeding.  No pelvic pain. Pap Neg 02/2021.  Pap reflex today. Urine and bowel movements normal.  Breast normal. Mammo Neg 12/2021.  Body mass index increased to 36.81.  Had Bariatric surgery in 2016.  Seeing Nutritionist.  Health labs with family physician. Colono 12/2015. Bone density normal in 03/2017. Flu vaccine done at work.  - Cytology - PAP( [facility])  2. Postmenopausal  Postmenopause, well on no hormone replacement therapy.  No postmenopausal bleeding.  No pelvic pain. Bone density normal in 03/2017.   Other orders - B Complex Vitamins (B COMPLEX PO); Take by mouth.   Princess Bruins MD, 1:43 PM

## 2022-03-28 LAB — CYTOLOGY - PAP: Diagnosis: NEGATIVE

## 2022-03-29 ENCOUNTER — Other Ambulatory Visit: Payer: Self-pay

## 2022-04-01 ENCOUNTER — Other Ambulatory Visit: Payer: Self-pay

## 2022-04-02 ENCOUNTER — Encounter: Payer: Self-pay | Admitting: Internal Medicine

## 2022-04-04 ENCOUNTER — Other Ambulatory Visit: Payer: Self-pay

## 2022-04-04 ENCOUNTER — Other Ambulatory Visit: Payer: Self-pay | Admitting: Internal Medicine

## 2022-04-04 MED ORDER — WEGOVY 0.5 MG/0.5ML ~~LOC~~ SOAJ
0.5000 mg | SUBCUTANEOUS | 3 refills | Status: DC
  Filled 2022-04-04: qty 2, 28d supply, fill #0

## 2022-04-08 ENCOUNTER — Other Ambulatory Visit: Payer: Self-pay

## 2022-04-10 ENCOUNTER — Other Ambulatory Visit: Payer: Self-pay | Admitting: Internal Medicine

## 2022-04-10 ENCOUNTER — Other Ambulatory Visit: Payer: Self-pay

## 2022-04-10 MED ORDER — WEGOVY 2.4 MG/0.75ML ~~LOC~~ SOAJ
2.4000 mg | SUBCUTANEOUS | 0 refills | Status: DC
  Filled 2022-04-10: qty 3, fill #0

## 2022-04-10 MED ORDER — WEGOVY 1.7 MG/0.75ML ~~LOC~~ SOAJ
1.7000 mg | SUBCUTANEOUS | 0 refills | Status: DC
  Filled 2022-04-10: qty 3, fill #0
  Filled 2022-05-22: qty 3, 28d supply, fill #0

## 2022-04-10 MED ORDER — WEGOVY 1 MG/0.5ML ~~LOC~~ SOAJ
1.0000 mg | SUBCUTANEOUS | 3 refills | Status: DC
  Filled 2022-04-10 – 2022-05-01 (×3): qty 2, 28d supply, fill #0

## 2022-04-24 ENCOUNTER — Other Ambulatory Visit: Payer: Self-pay

## 2022-05-01 ENCOUNTER — Other Ambulatory Visit: Payer: Self-pay

## 2022-05-03 ENCOUNTER — Other Ambulatory Visit: Payer: Self-pay

## 2022-05-22 ENCOUNTER — Other Ambulatory Visit: Payer: Self-pay

## 2022-05-24 ENCOUNTER — Other Ambulatory Visit: Payer: Self-pay

## 2022-06-05 ENCOUNTER — Other Ambulatory Visit: Payer: Self-pay

## 2022-09-04 ENCOUNTER — Other Ambulatory Visit: Payer: Self-pay

## 2022-12-04 ENCOUNTER — Other Ambulatory Visit: Payer: Self-pay

## 2022-12-17 DIAGNOSIS — Z1231 Encounter for screening mammogram for malignant neoplasm of breast: Secondary | ICD-10-CM | POA: Diagnosis not present

## 2022-12-18 ENCOUNTER — Encounter: Payer: Self-pay | Admitting: Internal Medicine

## 2022-12-26 DIAGNOSIS — R922 Inconclusive mammogram: Secondary | ICD-10-CM | POA: Diagnosis not present

## 2022-12-26 DIAGNOSIS — N6489 Other specified disorders of breast: Secondary | ICD-10-CM | POA: Diagnosis not present

## 2023-03-13 ENCOUNTER — Other Ambulatory Visit: Payer: Self-pay

## 2023-03-13 ENCOUNTER — Encounter: Payer: Self-pay | Admitting: Internal Medicine

## 2023-03-13 ENCOUNTER — Ambulatory Visit: Payer: Commercial Managed Care - PPO | Admitting: Internal Medicine

## 2023-03-13 VITALS — BP 120/70 | HR 70 | Temp 97.6°F | Ht 61.5 in | Wt 198.0 lb

## 2023-03-13 DIAGNOSIS — E663 Overweight: Secondary | ICD-10-CM

## 2023-03-13 DIAGNOSIS — Z0001 Encounter for general adult medical examination with abnormal findings: Secondary | ICD-10-CM | POA: Diagnosis not present

## 2023-03-13 DIAGNOSIS — G2581 Restless legs syndrome: Secondary | ICD-10-CM

## 2023-03-13 DIAGNOSIS — R7303 Prediabetes: Secondary | ICD-10-CM

## 2023-03-13 DIAGNOSIS — R252 Cramp and spasm: Secondary | ICD-10-CM

## 2023-03-13 DIAGNOSIS — Z13220 Encounter for screening for lipoid disorders: Secondary | ICD-10-CM | POA: Diagnosis not present

## 2023-03-13 DIAGNOSIS — Z0000 Encounter for general adult medical examination without abnormal findings: Secondary | ICD-10-CM

## 2023-03-13 LAB — CBC WITH DIFFERENTIAL/PLATELET
Basophils Absolute: 0 10*3/uL (ref 0.0–0.1)
Basophils Relative: 0.5 % (ref 0.0–3.0)
Eosinophils Absolute: 0.1 10*3/uL (ref 0.0–0.7)
Eosinophils Relative: 2.6 % (ref 0.0–5.0)
HCT: 35.7 % — ABNORMAL LOW (ref 36.0–46.0)
Hemoglobin: 11.8 g/dL — ABNORMAL LOW (ref 12.0–15.0)
Lymphocytes Relative: 35 % (ref 12.0–46.0)
Lymphs Abs: 1.9 10*3/uL (ref 0.7–4.0)
MCHC: 33.1 g/dL (ref 30.0–36.0)
MCV: 86.3 fL (ref 78.0–100.0)
Monocytes Absolute: 0.4 10*3/uL (ref 0.1–1.0)
Monocytes Relative: 7.1 % (ref 3.0–12.0)
Neutro Abs: 3 10*3/uL (ref 1.4–7.7)
Neutrophils Relative %: 54.8 % (ref 43.0–77.0)
Platelets: 257 10*3/uL (ref 150.0–400.0)
RBC: 4.14 Mil/uL (ref 3.87–5.11)
RDW: 13.9 % (ref 11.5–15.5)
WBC: 5.4 10*3/uL (ref 4.0–10.5)

## 2023-03-13 LAB — COMPREHENSIVE METABOLIC PANEL
ALT: 62 U/L — ABNORMAL HIGH (ref 0–35)
AST: 93 U/L — ABNORMAL HIGH (ref 0–37)
Albumin: 4 g/dL (ref 3.5–5.2)
Alkaline Phosphatase: 105 U/L (ref 39–117)
BUN: 16 mg/dL (ref 6–23)
CO2: 28 meq/L (ref 19–32)
Calcium: 8.9 mg/dL (ref 8.4–10.5)
Chloride: 105 meq/L (ref 96–112)
Creatinine, Ser: 0.72 mg/dL (ref 0.40–1.20)
GFR: 89.93 mL/min (ref >60.00)
Glucose, Bld: 92 mg/dL (ref 70–99)
Potassium: 4.4 meq/L (ref 3.5–5.1)
Sodium: 142 meq/L (ref 135–145)
Total Bilirubin: 0.3 mg/dL (ref 0.2–1.2)
Total Protein: 6.8 g/dL (ref 6.0–8.3)

## 2023-03-13 LAB — URINALYSIS
Bilirubin Urine: NEGATIVE
Hgb urine dipstick: NEGATIVE
Ketones, ur: NEGATIVE
Leukocytes,Ua: NEGATIVE
Nitrite: NEGATIVE
Specific Gravity, Urine: 1.01 (ref 1.000–1.030)
Total Protein, Urine: NEGATIVE
Urine Glucose: NEGATIVE
Urobilinogen, UA: 0.2 (ref 0.0–1.0)
pH: 7.5 (ref 5.0–8.0)

## 2023-03-13 LAB — MAGNESIUM: Magnesium: 2.4 mg/dL (ref 1.5–2.5)

## 2023-03-13 LAB — LIPID PANEL
Cholesterol: 194 mg/dL (ref 0–200)
HDL: 93.1 mg/dL (ref >39.00)
LDL Cholesterol: 87 mg/dL (ref 0–99)
NonHDL: 100.55
Total CHOL/HDL Ratio: 2
Triglycerides: 66 mg/dL (ref 0.0–149.0)
VLDL: 13.2 mg/dL (ref 0.0–40.0)

## 2023-03-13 LAB — VITAMIN B12: Vitamin B-12: 357 pg/mL (ref 211–911)

## 2023-03-13 LAB — TSH: TSH: 0.72 u[IU]/mL (ref 0.35–5.50)

## 2023-03-13 LAB — VITAMIN D 25 HYDROXY (VIT D DEFICIENCY, FRACTURES): VITD: 46.67 ng/mL (ref 30.00–100.00)

## 2023-03-13 MED ORDER — PANTOPRAZOLE SODIUM 40 MG PO TBEC
DELAYED_RELEASE_TABLET | Freq: Every day | ORAL | 3 refills | Status: AC
  Filled 2023-03-13: qty 90, 90d supply, fill #0
  Filled 2023-06-09: qty 90, 90d supply, fill #1
  Filled 2023-09-11: qty 90, 90d supply, fill #2
  Filled 2023-12-09: qty 90, 90d supply, fill #3

## 2023-03-13 MED ORDER — PHENTERMINE HCL 37.5 MG PO TABS
37.5000 mg | ORAL_TABLET | Freq: Every day | ORAL | 2 refills | Status: AC
  Filled 2023-04-25: qty 30, 30d supply, fill #1
  Filled 2023-05-28: qty 30, 30d supply, fill #2

## 2023-03-13 MED ORDER — ROPINIROLE HCL 0.25 MG PO TABS
0.7500 mg | ORAL_TABLET | Freq: Every evening | ORAL | 3 refills | Status: AC
  Filled 2023-03-13: qty 270, 90d supply, fill #0
  Filled 2023-06-09: qty 270, 90d supply, fill #1
  Filled 2023-09-11: qty 270, 90d supply, fill #2
  Filled 2023-12-09: qty 270, 90d supply, fill #3

## 2023-03-13 NOTE — Assessment & Plan Note (Signed)
  We discussed age appropriate health related issues, including available/recomended screening tests and vaccinations. Labs were ordered to be later reviewed . All questions were answered. We discussed one or more of the following - seat belt use, use of sunscreen/sun exposure exercise, fall risk reduction, second hand smoke exposure, firearm use and storage, seat belt use, a need for adhering to healthy diet and exercise. Labs were ordered.  All questions were answered. PAP w/Dr Dellis Filbert Last colon in 2017 - due in 2027 Shingrix was suggested

## 2023-03-13 NOTE — Assessment & Plan Note (Signed)
 Wegovy  is $$$$ Trphentermine 

## 2023-03-13 NOTE — Assessment & Plan Note (Signed)
 Tylenol  pm Magnesium  oil spray

## 2023-03-13 NOTE — Progress Notes (Signed)
 Subjective:  Patient ID: Suzanne Schaefer, female    DOB: 1961/02/01  Age: 63 y.o. MRN: [id number]  CC: Annual Exam   HPI Suzanne Schaefer presents for a well exam Wegovy  is too $$$ (dropped wt to 185 lbs) C/o leg cramps  Outpatient Medications Prior to Visit  Medication Sig Dispense Refill   acetaminophen  (TYLENOL ) 500 MG tablet Take 500 mg by mouth every 6 (six) hours as needed.     Aspirin-Caffeine (BAYER BACK & BODY) 500-32.5 MG TABS Take by mouth. Take 1 tablet by mouth every day     B Complex Vitamins (B COMPLEX PO) Take by mouth.     Cetirizine-Pseudoephedrine (ZYRTEC-D PO) Take 1 tablet by mouth every morning.      Cholecalciferol  (VITAMIN D -3) 1000 UNITS CAPS Take 2 capsules by mouth 3 (three) times a week.      Collagen-Boron-Hyaluronic Acid (MOVE FREE ULTRA JOINT HEALTH) 40-5-3.3 MG TABS Take by mouth. Take 1 tablet by mouth every day     fluticasone  (FLONASE ) 50 MCG/ACT nasal spray Place 2 sprays into both nostrils at bedtime.     Ibuprofen  200 MG CAPS Take by mouth every 6 (six) hours as needed.     Melatonin 10 MG CAPS Take by mouth.     Multiple Vitamins-Minerals (CENTRUM VITAMINTS PO) Take 1 each by mouth daily.     Naproxen Sodium (ALEVE PO) Take by mouth. Aleve back & muscle     polyethylene glycol (MIRALAX / GLYCOLAX) packet Take 17 g by mouth at bedtime.     TURMERIC PO Take by mouth.     UNABLE TO FIND Med Name: aloe pill     Semaglutide -Weight Management (WEGOVY ) 1 MG/0.5ML SOAJ Inject 1 mg into the skin once a week. 2 mL 3   Semaglutide -Weight Management (WEGOVY ) 1.7 MG/0.75ML SOAJ Inject 1.7 mg into the skin once a week. 3 mL 0   Semaglutide -Weight Management (WEGOVY ) 2.4 MG/0.75ML SOAJ Inject 2.4 mg into the skin once a week. 3 mL 0   pantoprazole  (PROTONIX ) 40 MG tablet TAKE 1 TABLET (40 MG TOTAL) BY MOUTH DAILY. 90 tablet 3   rOPINIRole  (REQUIP ) 0.25 MG tablet Take 3 tablets (0.75 mg total) by mouth at bedtime. 270 tablet 3   No facility-administered  medications prior to visit.    ROS: Review of Systems  Constitutional:  Positive for unexpected weight change. Negative for activity change, appetite change, chills and fatigue.  HENT:  Negative for congestion, mouth sores and sinus pressure.   Eyes:  Negative for visual disturbance.  Respiratory:  Negative for cough and chest tightness.   Gastrointestinal:  Negative for abdominal pain and nausea.  Genitourinary:  Negative for difficulty urinating, frequency and vaginal pain.  Musculoskeletal:  Negative for back pain and gait problem.  Skin:  Negative for pallor and rash.  Neurological:  Negative for dizziness, tremors, weakness, numbness and headaches.  Psychiatric/Behavioral:  Negative for confusion and sleep disturbance.     Objective:  BP 120/70 (BP Location: Left Arm, Patient Position: Sitting, Cuff Size: Normal)   Pulse 70   Temp 97.6 F (36.4 C) (Oral)   Ht 5' 1.5 (1.562 m)   Wt 198 lb (89.8 kg)   LMP 06/29/2012 Comment: not sexually active  SpO2 97%   BMI 36.81 kg/m   BP Readings from Last 3 Encounters:  03/13/23 120/70  03/27/22 100/80  03/11/22 120/82    Wt Readings from Last 3 Encounters:  03/13/23 198 lb (89.8 kg)  03/27/22 198  lb (89.8 kg)  03/11/22 190 lb (86.2 kg)    Physical Exam Constitutional:      General: She is not in acute distress.    Appearance: She is well-developed. She is obese.  HENT:     Head: Normocephalic.     Right Ear: External ear normal.     Left Ear: External ear normal.     Nose: Nose normal.  Eyes:     General:        Right eye: No discharge.        Left eye: No discharge.     Conjunctiva/sclera: Conjunctivae normal.     Pupils: Pupils are equal, round, and reactive to light.  Neck:     Thyroid : No thyromegaly.     Vascular: No JVD.     Trachea: No tracheal deviation.  Cardiovascular:     Rate and Rhythm: Normal rate and regular rhythm.     Heart sounds: Normal heart sounds.  Pulmonary:     Effort: No respiratory  distress.     Breath sounds: No stridor. No wheezing.  Abdominal:     General: Bowel sounds are normal. There is no distension.     Palpations: Abdomen is soft. There is no mass.     Tenderness: There is no abdominal tenderness. There is no guarding or rebound.  Musculoskeletal:        General: No tenderness.     Cervical back: Normal range of motion and neck supple. No rigidity.  Lymphadenopathy:     Cervical: No cervical adenopathy.  Skin:    Findings: No erythema or rash.  Neurological:     Cranial Nerves: No cranial nerve deficit.     Motor: No abnormal muscle tone.     Coordination: Coordination normal.     Deep Tendon Reflexes: Reflexes normal.  Psychiatric:        Behavior: Behavior normal.        Thought Content: Thought content normal.        Judgment: Judgment normal.     Lab Results  Component Value Date   WBC 6.3 03/11/2022   HGB 12.9 03/11/2022   HCT 38.5 03/11/2022   PLT 230.0 03/11/2022   GLUCOSE 90 03/11/2022   CHOL 256 (H) 03/11/2022   TRIG 84.0 03/11/2022   HDL 108.20 03/11/2022   LDLDIRECT 115.2 01/27/2013   LDLCALC 131 (H) 03/11/2022   ALT 12 03/11/2022   AST 44 (H) 03/11/2022   NA 140 03/11/2022   K 3.8 03/11/2022   CL 103 03/11/2022   CREATININE 0.74 03/11/2022   BUN 17 03/11/2022   CO2 28 03/11/2022   TSH 0.97 03/11/2022   INR 1.12 02/24/2015   HGBA1C 5.5 03/11/2022    No results found.  Assessment & Plan:   Problem List Items Addressed This Visit     RESTLESS LEG SYNDROME   Relevant Orders   Magnesium    Vitamin B12   VITAMIN D  25 Hydroxy (Vit-D Deficiency, Fractures)   Well adult exam - Primary    We discussed age appropriate health related issues, including available/recomended screening tests and vaccinations. Labs were ordered to be later reviewed . All questions were answered. We discussed one or more of the following - seat belt use, use of sunscreen/sun exposure exercise, fall risk reduction, second hand smoke exposure,  firearm use and storage, seat belt use, a need for adhering to healthy diet and exercise. Labs were ordered.  All questions were answered. PAP w/Dr Lavoie Last colon  in 2017 - due in 2027 Shingrix was suggested      Relevant Orders   TSH   Urinalysis   CBC with Differential/Platelet   Lipid panel   Comprehensive metabolic panel   Magnesium    Vitamin B12   VITAMIN D  25 Hydroxy (Vit-D Deficiency, Fractures)   Cramps, extremity   Tylenol  pm Magnesium  oil spray         Meds ordered this encounter  Medications   phentermine  (ADIPEX-P ) 37.5 MG tablet    Sig: Take 1 tablet (37.5 mg total) by mouth daily before breakfast.    Dispense:  30 tablet    Refill:  2      Follow-up: Return in about 3 months (around 06/10/2023) for a follow-up visit.  Marolyn Noel, MD

## 2023-03-13 NOTE — Assessment & Plan Note (Signed)
On requip 

## 2023-03-18 ENCOUNTER — Encounter: Payer: Self-pay | Admitting: Internal Medicine

## 2023-03-18 ENCOUNTER — Other Ambulatory Visit: Payer: Self-pay | Admitting: Internal Medicine

## 2023-03-18 DIAGNOSIS — R7989 Other specified abnormal findings of blood chemistry: Secondary | ICD-10-CM

## 2023-03-18 DIAGNOSIS — D649 Anemia, unspecified: Secondary | ICD-10-CM

## 2023-03-31 ENCOUNTER — Ambulatory Visit (INDEPENDENT_AMBULATORY_CARE_PROVIDER_SITE_OTHER): Payer: Commercial Managed Care - PPO | Admitting: Obstetrics and Gynecology

## 2023-03-31 ENCOUNTER — Encounter: Payer: Self-pay | Admitting: Obstetrics and Gynecology

## 2023-03-31 ENCOUNTER — Other Ambulatory Visit (HOSPITAL_COMMUNITY)
Admission: RE | Admit: 2023-03-31 | Discharge: 2023-03-31 | Disposition: A | Discharge status: Home / Self Care | Source: Ambulatory Visit | Attending: Obstetrics and Gynecology | Admitting: Obstetrics and Gynecology

## 2023-03-31 VITALS — BP 128/82 | HR 74 | Wt 207.0 lb

## 2023-03-31 DIAGNOSIS — N951 Menopausal and female climacteric states: Secondary | ICD-10-CM

## 2023-03-31 DIAGNOSIS — Z1231 Encounter for screening mammogram for malignant neoplasm of breast: Secondary | ICD-10-CM

## 2023-03-31 DIAGNOSIS — Z01419 Encounter for gynecological examination (general) (routine) without abnormal findings: Secondary | ICD-10-CM | POA: Diagnosis not present

## 2023-03-31 DIAGNOSIS — Z1331 Encounter for screening for depression: Secondary | ICD-10-CM | POA: Diagnosis not present

## 2023-03-31 DIAGNOSIS — E2839 Other primary ovarian failure: Secondary | ICD-10-CM

## 2023-03-31 NOTE — Progress Notes (Signed)
 63 y.o. y.o. female here for annual exam. Patient's last menstrual period was 06/29/2012.    G2P2L2 Divorced.  2 children in their 30s.   RP:  Established patient presenting for annual gyn exam    HPI: Abstinent x 2013.  H/O Vulvar warts back then, no recurrence in the past several years.  Postmenopause, well on no hormone replacement therapy.  No postmenopausal bleeding.  No pelvic pain. Pap Neg 02/2021.  Pap reflex today. Urine and bowel movements normal.  Breast normal. Mammo Neg 12/2022.  Had Bariatric surgery in 2016.  Seeing Nutritionist.  Health labs with family physician. Colono 12/2015. Bone density normal in 03/2017 repeat sent. Flu vaccine done at work.   Past medical history,surgical history, family history and social history were all reviewed and documented in the EPIC chart. Body mass index is 38.48 kg/m.     03/31/2023    7:59 AM 03/13/2023    8:07 AM 03/11/2022    8:10 AM  Depression screen PHQ 2/9  Decreased Interest 0 0 0  Down, Depressed, Hopeless 0 0 0  PHQ - 2 Score 0 0 0    Blood pressure 128/82, pulse 74, weight 207 lb (93.9 kg), last menstrual period 06/29/2012, SpO2 100%.     Component Value Date/Time   DIAGPAP  03/27/2022 1359    - Negative for intraepithelial lesion or malignancy (NILM)   DIAGPAP  03/01/2021 1701    - Negative for intraepithelial lesion or malignancy (NILM)   ADEQPAP  03/27/2022 1359    Satisfactory for evaluation. The presence or absence of an   ADEQPAP  03/27/2022 1359    endocervical/transformation zone component cannot be determined because   ADEQPAP of atrophy. 03/27/2022 1359    GYN HISTORY:    Component Value Date/Time   DIAGPAP  03/27/2022 1359    - Negative for intraepithelial lesion or malignancy (NILM)   DIAGPAP  03/01/2021 1701    - Negative for intraepithelial lesion or malignancy (NILM)   ADEQPAP  03/27/2022 1359    Satisfactory for evaluation. The presence or absence of an   ADEQPAP  03/27/2022 1359     endocervical/transformation zone component cannot be determined because   ADEQPAP of atrophy. 03/27/2022 1359    OB History  Gravida Para Term Preterm AB Living  2 2 2   2   SAB IAB Ectopic Multiple Live Births          # Outcome Date GA Lbr Len/2nd Weight Sex Type Anes PTL Lv  2 Term           1 Term             Past Medical History:  Diagnosis Date   Allergic rhinitis    uses OTC meds   Allergy    Arthritis    arthritis -knees ,ankles   Back pain    Complication of anesthesia    Constipation    Depression    Esophagitis    Fatty liver    Gallbladder problem    GERD (gastroesophageal reflux disease)    Gestational HTN    History of sinusitis    no current problems   Leg edema    Obesity    Obstructive sleep apnea    no cpap currently.   PONV (postoperative nausea and vomiting)    Restless leg syndrome    Sleep apnea    no cpap   Tonsillitis    nothing recent    Past Surgical History:  Procedure Laterality Date   bilat salpingectomy     CERVICAL BIOPSY     CESAREAN SECTION     HEMORRHOID SURGERY     LAPAROSCOPIC CHOLECYSTECTOMY  1992   LAPAROSCOPIC GASTRIC SLEEVE RESECTION N/A 04/05/2014   Procedure: LAPAROSCOPIC GASTRIC SLEEVE RESECTION with hiatel hernia repair;  Surgeon: Valarie Merino, MD;  Location: [facility];  Service: General;  Laterality: N/A;   TUBAL LIGATION  1988 1992   UPPER GASTROINTESTINAL ENDOSCOPY     UPPER GI ENDOSCOPY  04/05/2014   Procedure: UPPER GI ENDOSCOPY;  Surgeon: Valarie Merino, MD;  Location: [facility];  Service: General;;    Current Outpatient Medications on File Prior to Visit  Medication Sig Dispense Refill   acetaminophen (TYLENOL) 500 MG tablet Take 500 mg by mouth every 6 (six) hours as needed.     Aspirin-Caffeine (BAYER BACK & BODY) 500-32.5 MG TABS Take by mouth. Take 1 tablet by mouth every day     B Complex Vitamins (B COMPLEX PO) Take by mouth.     Cetirizine-Pseudoephedrine (ZYRTEC-D PO) Take 1 tablet by mouth every  morning.      Cholecalciferol (VITAMIN D-3) 1000 UNITS CAPS Take 2 capsules by mouth 3 (three) times a week.      Collagen-Boron-Hyaluronic Acid (MOVE FREE ULTRA JOINT HEALTH) 40-5-3.3 MG TABS Take by mouth. Take 1 tablet by mouth every day     fluticasone (FLONASE) 50 MCG/ACT nasal spray Place 2 sprays into both nostrils at bedtime.     Ibuprofen 200 MG CAPS Take by mouth every 6 (six) hours as needed.     Melatonin 10 MG CAPS Take by mouth.     Multiple Vitamins-Minerals (CENTRUM VITAMINTS PO) Take 1 each by mouth daily.     Naproxen Sodium (ALEVE PO) Take by mouth. Aleve back & muscle     pantoprazole (PROTONIX) 40 MG tablet TAKE 1 TABLET (40 MG TOTAL) BY MOUTH DAILY. 90 tablet 3   phentermine (ADIPEX-P) 37.5 MG tablet Take 1 tablet (37.5 mg total) by mouth daily before breakfast. 30 tablet 2   polyethylene glycol (MIRALAX / GLYCOLAX) packet Take 17 g by mouth at bedtime.     rOPINIRole (REQUIP) 0.25 MG tablet Take 3 tablets (0.75 mg total) by mouth at bedtime. 270 tablet 3   TURMERIC PO Take by mouth.     UNABLE TO FIND Med Name: aloe pill     No current facility-administered medications on file prior to visit.    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 Married 74- divorce '89. 2 daughters '86, 88. Work Counsellor. Lives in own  home daughters at home   Social Drivers of Health   Financial Resource Strain: Not on file  Food Insecurity: Not on file  Transportation Needs: Not on file  Physical Activity: Not on file  Stress: Not on file  Social Connections: Not on file  Intimate Partner Violence: Not on file    Family History  Problem Relation Age of Onset   Asthma Mother    COPD Mother    Cancer Mother 70       PERITONEAL    Allergies Mother        ? seasonal   Stroke Mother    Thyroid disease Mother    Hypertension Father    Obesity Father    Glaucoma Father    Atrial fibrillation Father    Sleep apnea Father    Cancer Maternal Uncle        >20 colon polyps   Colon polyps Maternal Uncle    Ovarian cancer Paternal Aunt    Cancer Paternal Uncle        blood cancer   Cancer Paternal Grandfather        lymphoma     Allergies  Allergen Reactions   Ciprofloxacin Nausea And Vomiting   Hydrocodone Nausea And Vomiting   Clindamycin/Lincomycin Rash      Patient's last menstrual period was Patient's last menstrual period was 06/29/2012.Marland Kitchen           Review of Systems Alls systems reviewed and are negative.     Physical Exam Constitutional:      Appearance: Normal appearance.  Genitourinary:     Vulva and urethral meatus normal.     No lesions in the vagina.     Right Labia: No rash, lesions or skin changes.    Left Labia: No lesions, skin changes or rash.       No vaginal discharge or tenderness.     No vaginal prolapse present.    Mild vaginal atrophy present.     Right Adnexa: not tender, not palpable and no mass present.    Left Adnexa: not tender, not palpable and no mass present.    No cervical motion tenderness or discharge.     Uterus is not enlarged, tender or irregular.  Breasts:    Right: Normal.     Left: Normal.  HENT:     Head: Normocephalic.  Neck:     Thyroid: No thyroid mass, thyromegaly or thyroid tenderness.  Cardiovascular:     Rate and Rhythm: Normal rate  and regular rhythm.     Heart sounds: Normal heart sounds, S1 normal and S2 normal.  Pulmonary:     Effort: Pulmonary effort is normal.     Breath sounds: Normal breath sounds and air entry.  Abdominal:     General: There is no distension.     Palpations: Abdomen is soft. There is no mass.     Tenderness: There is no abdominal tenderness. There is no guarding or rebound.  Musculoskeletal:        General: Normal range of motion.     Cervical back: Full passive range of motion without pain, normal range of motion and neck supple. No tenderness.     Right lower leg: No edema.     Left lower leg: No edema.  Neurological:     Mental Status: She is alert.  Skin:    General: Skin is warm.  Psychiatric:        Mood and Affect: Mood normal.        Behavior: Behavior normal.        Thought Content: Thought  content normal.  Vitals and nursing note reviewed. Exam conducted with a chaperone present.       A:         Well Woman GYN exam, h/o remote abnormal pap smears                             P:        Pap smear collected today Encouraged annual mammogram screening Colon cancer screening up-to-date DXA ordered today Labs and immunizations to do with PMD Encouraged healthy lifestyle practices Encouraged Vit D and Calcium   No follow-ups on file.  Earley Favor

## 2023-03-31 NOTE — Addendum Note (Signed)
 Addended by: Earley Favor on: 03/31/2023 11:54 AM   Modules accepted: Orders

## 2023-04-01 ENCOUNTER — Ambulatory Visit
Admission: RE | Admit: 2023-04-01 | Discharge: 2023-04-01 | Disposition: A | Discharge status: Home / Self Care | Payer: Commercial Managed Care - PPO | Source: Ambulatory Visit | Attending: Internal Medicine | Admitting: Internal Medicine

## 2023-04-01 DIAGNOSIS — R7989 Other specified abnormal findings of blood chemistry: Secondary | ICD-10-CM

## 2023-04-01 DIAGNOSIS — K760 Fatty (change of) liver, not elsewhere classified: Secondary | ICD-10-CM | POA: Diagnosis not present

## 2023-04-03 LAB — CYTOLOGY - PAP
Comment: NEGATIVE
Diagnosis: NEGATIVE
High risk HPV: NEGATIVE

## 2023-04-04 ENCOUNTER — Encounter: Payer: Self-pay | Admitting: Internal Medicine

## 2023-04-04 ENCOUNTER — Encounter: Payer: Self-pay | Admitting: Obstetrics and Gynecology

## 2023-04-25 ENCOUNTER — Other Ambulatory Visit: Payer: Self-pay

## 2023-05-29 ENCOUNTER — Other Ambulatory Visit: Payer: Self-pay

## 2023-06-09 ENCOUNTER — Other Ambulatory Visit: Payer: Self-pay

## 2023-07-08 ENCOUNTER — Other Ambulatory Visit: Payer: Self-pay

## 2023-07-08 MED ORDER — CHLORHEXIDINE GLUCONATE 0.12 % MT SOLN
Freq: Two times a day (BID) | OROMUCOSAL | 0 refills | Status: AC
  Filled 2023-07-08: qty 473, 15d supply, fill #0

## 2023-12-18 DIAGNOSIS — Z1231 Encounter for screening mammogram for malignant neoplasm of breast: Secondary | ICD-10-CM | POA: Diagnosis not present

## 2023-12-18 LAB — HM MAMMOGRAPHY

## 2024-02-03 ENCOUNTER — Encounter: Payer: Self-pay | Admitting: Internal Medicine

## 2024-02-13 ENCOUNTER — Other Ambulatory Visit: Payer: Self-pay | Admitting: Internal Medicine

## 2024-02-13 DIAGNOSIS — Z780 Asymptomatic menopausal state: Secondary | ICD-10-CM

## 2024-03-15 ENCOUNTER — Encounter: Admitting: Internal Medicine

## 2024-04-01 ENCOUNTER — Ambulatory Visit: Admitting: Obstetrics and Gynecology

## 2024-04-06 ENCOUNTER — Ambulatory Visit: Admitting: Obstetrics and Gynecology
